# Patient Record
Sex: Female | Born: 1965
Health system: Southern US, Community
[De-identification: ages and names within clinical notes are randomized; demographics above are authoritative.]

## PROBLEM LIST (undated history)

## (undated) DIAGNOSIS — E785 Hyperlipidemia, unspecified: Secondary | ICD-10-CM

## (undated) DIAGNOSIS — K5190 Ulcerative colitis, unspecified, without complications: Secondary | ICD-10-CM

## (undated) DIAGNOSIS — K429 Umbilical hernia without obstruction or gangrene: Secondary | ICD-10-CM

## (undated) DIAGNOSIS — G629 Polyneuropathy, unspecified: Secondary | ICD-10-CM

## (undated) DIAGNOSIS — E559 Vitamin D deficiency, unspecified: Secondary | ICD-10-CM

## (undated) DIAGNOSIS — F329 Major depressive disorder, single episode, unspecified: Secondary | ICD-10-CM

## (undated) DIAGNOSIS — I700 Atherosclerosis of aorta: Secondary | ICD-10-CM

## (undated) DIAGNOSIS — J45909 Unspecified asthma, uncomplicated: Secondary | ICD-10-CM

## (undated) DIAGNOSIS — F32A Depression, unspecified: Secondary | ICD-10-CM

## (undated) DIAGNOSIS — K219 Gastro-esophageal reflux disease without esophagitis: Secondary | ICD-10-CM

## (undated) DIAGNOSIS — M130 Polyarthritis, unspecified: Secondary | ICD-10-CM

## (undated) DIAGNOSIS — E039 Hypothyroidism, unspecified: Secondary | ICD-10-CM

## (undated) DIAGNOSIS — M797 Fibromyalgia: Secondary | ICD-10-CM

## (undated) DIAGNOSIS — G4700 Insomnia, unspecified: Secondary | ICD-10-CM

## (undated) DIAGNOSIS — E079 Disorder of thyroid, unspecified: Secondary | ICD-10-CM

## (undated) HISTORY — DX: Vitamin D deficiency, unspecified: E55.9

## (undated) HISTORY — DX: Gastro-esophageal reflux disease without esophagitis: K21.9

## (undated) HISTORY — DX: Atherosclerosis of aorta: I70.0

## (undated) HISTORY — DX: Insomnia, unspecified: G47.00

## (undated) HISTORY — DX: Ulcerative colitis, unspecified, without complications: K51.90

## (undated) HISTORY — DX: Hyperlipidemia, unspecified: E78.5

## (undated) HISTORY — DX: Hypothyroidism, unspecified: E03.9

## (undated) HISTORY — DX: Disorder of thyroid, unspecified: E07.9

## (undated) HISTORY — PX: COLONOSCOPY: SHX174

## (undated) HISTORY — DX: Polyarthritis, unspecified: M13.0

## (undated) HISTORY — DX: Fibromyalgia: M79.7

## (undated) HISTORY — DX: Unspecified asthma, uncomplicated: J45.909

## (undated) HISTORY — PX: ABDOMINAL HYSTERECTOMY: SHX81

## (undated) HISTORY — DX: Umbilical hernia without obstruction or gangrene: K42.9

## (undated) HISTORY — DX: Polyneuropathy, unspecified: G62.9

## (undated) HISTORY — PX: APPENDECTOMY: SHX54

---

## 1998-04-18 ENCOUNTER — Emergency Department (HOSPITAL_COMMUNITY): Admission: EM | Admit: 1998-04-18 | Discharge: 1998-04-18 | Payer: Self-pay | Admitting: Emergency Medicine

## 1999-06-05 ENCOUNTER — Other Ambulatory Visit: Admission: RE | Admit: 1999-06-05 | Discharge: 1999-06-05 | Payer: Self-pay | Admitting: Family Medicine

## 1999-10-08 ENCOUNTER — Encounter: Admission: RE | Admit: 1999-10-08 | Discharge: 1999-10-08 | Payer: Self-pay | Admitting: Family Medicine

## 1999-10-08 ENCOUNTER — Encounter: Payer: Self-pay | Admitting: Family Medicine

## 2000-04-03 ENCOUNTER — Inpatient Hospital Stay (HOSPITAL_COMMUNITY): Admission: AD | Admit: 2000-04-03 | Discharge: 2000-04-03 | Payer: Self-pay | Admitting: *Deleted

## 2014-05-24 LAB — HM COLONOSCOPY

## 2015-03-10 ENCOUNTER — Emergency Department (HOSPITAL_BASED_OUTPATIENT_CLINIC_OR_DEPARTMENT_OTHER)
Admission: EM | Admit: 2015-03-10 | Discharge: 2015-03-10 | Disposition: A | Discharge status: Home / Self Care | Payer: Commercial Managed Care - PPO | Attending: Emergency Medicine | Admitting: Emergency Medicine

## 2015-03-10 ENCOUNTER — Encounter (HOSPITAL_BASED_OUTPATIENT_CLINIC_OR_DEPARTMENT_OTHER): Payer: Self-pay | Admitting: *Deleted

## 2015-03-10 DIAGNOSIS — S6991XD Unspecified injury of right wrist, hand and finger(s), subsequent encounter: Secondary | ICD-10-CM | POA: Diagnosis present

## 2015-03-10 DIAGNOSIS — Z23 Encounter for immunization: Secondary | ICD-10-CM | POA: Diagnosis not present

## 2015-03-10 DIAGNOSIS — Z8659 Personal history of other mental and behavioral disorders: Secondary | ICD-10-CM | POA: Diagnosis not present

## 2015-03-10 DIAGNOSIS — Z8719 Personal history of other diseases of the digestive system: Secondary | ICD-10-CM | POA: Insufficient documentation

## 2015-03-10 DIAGNOSIS — Z880 Allergy status to penicillin: Secondary | ICD-10-CM | POA: Insufficient documentation

## 2015-03-10 DIAGNOSIS — S61031D Puncture wound without foreign body of right thumb without damage to nail, subsequent encounter: Secondary | ICD-10-CM | POA: Diagnosis not present

## 2015-03-10 DIAGNOSIS — Z87891 Personal history of nicotine dependence: Secondary | ICD-10-CM | POA: Diagnosis not present

## 2015-03-10 DIAGNOSIS — X58XXXD Exposure to other specified factors, subsequent encounter: Secondary | ICD-10-CM | POA: Diagnosis not present

## 2015-03-10 DIAGNOSIS — T148XXA Other injury of unspecified body region, initial encounter: Secondary | ICD-10-CM

## 2015-03-10 HISTORY — DX: Depression, unspecified: F32.A

## 2015-03-10 HISTORY — DX: Major depressive disorder, single episode, unspecified: F32.9

## 2015-03-10 HISTORY — DX: Gastro-esophageal reflux disease without esophagitis: K21.9

## 2015-03-10 MED ORDER — TETANUS-DIPHTH-ACELL PERTUSSIS 5-2.5-18.5 LF-MCG/0.5 IM SUSP
0.5000 mL | Freq: Once | INTRAMUSCULAR | Status: AC
  Administered 2015-03-10: 0.5 mL via INTRAMUSCULAR
  Filled 2015-03-10: qty 0.5

## 2015-03-10 NOTE — ED Provider Notes (Signed)
Medical screening examination/treatment/procedure(s) were conducted as a shared visit with non-physician practitioner(s) and myself.  I personally evaluated the patient during the encounter.   EKG Interpretation None      Patient seen by me. Patient with injury probable of bite to the right thumb on June 9 while gardening whatever causes it was never visualized but some kind of bite him with some pain was noted. There was never any significant swelling of the thumb or hand. Patient treated with doxycycline later by her primary care doctor. Right now there are 2 somewhat necrotic puncture marks no significant tenderness to the pulp no evidence of any deep space infection or cellulitis. Good range of motion. Cap refill is 1 second.  Overall the wound is healing well does have 2 necrotic areas but no evidence of any cyst secondary infection no evidence of any deep space infection. No specific treatment required. Precautions provided.  Vanetta Mulders, MD 03/10/15 [phone]

## 2015-03-10 NOTE — Discharge Instructions (Signed)
°Emergency Department Resource Guide °1) Find a Doctor and Pay Out of Pocket °Although you won't have to find out who is covered by your insurance plan, it is a good idea to ask around and get recommendations. You will then need to call the office and see if the doctor you have chosen will accept you as a new patient and what types of options they offer for patients who are self-pay. Some doctors offer discounts or will set up payment plans for their patients who do not have insurance, but you will need to ask so you aren't surprised when you get to your appointment. ° °2) Contact Your Local Health Department °Not all health departments have doctors that can see patients for sick visits, but many do, so it is worth a call to see if yours does. If you don't know where your local health department is, you can check in your phone book. The CDC also has a tool to help you locate your state's health department, and many state websites also have listings of all of their local health departments. ° °3) Find a Walk-in Clinic °If your illness is not likely to be very severe or complicated, you may want to try a walk in clinic. These are popping up all over the country in pharmacies, drugstores, and shopping centers. They're usually staffed by nurse practitioners or physician assistants that have been trained to treat common illnesses and complaints. They're usually fairly quick and inexpensive. However, if you have serious medical issues or chronic medical problems, these are probably not your best option. ° °No Primary Care Doctor: °- Call [facility] at  832-8000 - they can help you locate a primary care doctor that  accepts your insurance, provides certain services, etc. °- Physician Referral Service- 1-800-533-3463 ° °Chronic Pain Problems: °Organization         Address  Phone   Notes  °[facility]  (336) 297-2271 Patients need to be referred by their primary care doctor.  ° °Medication  Assistance: °Organization         Address  Phone   Notes  °[place] [facility] [address] °[place], [place] [address] (336) 641-8030 --Must be a resident of [place] °-- Must have NO insurance coverage whatsoever (no Medicaid/ Medicare, etc.) °-- The pt. MUST have a primary care doctor that directs their care regularly and follows them in the community °  °[facility]  (866) 331-1348   °[employer]  (888) 892-1162   ° °Agencies that provide inexpensive medical care: °Organization         Address  Phone   Notes  °[facility]  (336) 832-8035   °[facility]    (336) 832-7272   °[facility] [address] °[place], [place] [address] (336) 832-4777   °[facility] [address], °[place] (336) 271-4999   °[facility]    (336) 373-0678   °[facility]    (336) 272-1050   °[facility] ° [address], [place] Phone:  (336) 832-4444, Fax:  (336) 832-4440 Hours of Operation:  9 am - 6 pm, M-F.  Also accepts Medicaid/Medicare and self-pay.  °[facility] ° [address], [place] Phone: (336) 832-3150, Fax: (336) 832-3151. Hours of Operation:  8:30 am - 5:30 pm, M-F.  Also accepts Medicaid and self-pay.  °[facility] [place] [address], [place] Phone: (336) 878-6027   °[facility] [address], [place], [place] (336)723-1848, Ext. 123 Mondays & Thursdays: 7-9 AM.  First 15 patients are seen on a first come, first serve basis. °  ° °Medicaid-accepting [place] Providers: ° °Organization         Address  Phone   Notes  °[facility] [address], [place] (336) 641-2100 Also accepts self-pay patients.  °[facility] [address], [place] ° (336) 856-9996   °[facility] [address], [place]  (336) 288-8857   °[facility] [address] [place] [address], [place] (336) 299-7000   °Veita Bland [address], [place]  ° (336) 373-1557 Only accepts [place] Access Medicaid patients after they have their name applied to their card.  ° °Self-Pay (no insurance) in [place]: ° °Organization         Address  Phone   Notes  °Sickle Cell Patients, [facility] [address], [place] (336) 832-1970   °[facility] [address], [place] (336) 832-4400   °[facility] ° [address], [place] (336) 992-4800   °[facility]/Dr. Osei-Bonsu ° [address] [place] [address], [place] or [address], [place] (336) 841-8500 Phone number for both [place] and [place] locations is the same.  °[facility] [address], [place] (336) 299-0000   °[facility] [address] [place] [address], [place] or [address] (336) 852-7530 °(336) 878-2260   °[facility] [address], [place] (336) 350-1642, phone; (336) 294-5005, fax Sees patients 1st and 3rd Saturday of every month.  Must not qualify for public or private insurance (i.e. Medicaid, Medicare, [place] Health Choice, Veterans' Benefits) • Household income should be no more than 200% of the poverty level •The clinic cannot treat you if you are pregnant or think you are pregnant • Sexually transmitted diseases are not treated at the clinic.  ° ° °Dental Care: °Organization         Address  Phone  Notes  °[place] [facility] [address], [place] (336) 641-6152 Accepts children up to age 21 who are enrolled in Medicaid or [place] Health Choice; pregnant women with a Medicaid card; and children who have applied for Medicaid or [place] Health Choice, but were declined, whose parents can pay a reduced fee at time of service.  °[place]  [facility] [place]  [address], [place] (336) 641-7733 Accepts children up to age 21 who are enrolled in Medicaid or  Health Choice; pregnant women with a Medicaid card; and children who have applied for Medicaid or  Health Choice, but were declined, whose parents can pay a reduced fee at time of service.  °[facility] ° [address],  (336) 641-4533 Patients are seen by appointment only. Walk-ins are not accepted. [facility] will see patients 18 years of age and older. °Monday - Tuesday (8am-5pm) °Most Wednesdays (8:30-5pm) °$30 per visit, cash only  °[facility] ° [address], [place] (336) 641-4533 Patients are seen by appointment only. Walk-ins are not accepted. [facility] will see patients 18 years of age and older. °One   Wednesday Evening (Monthly: Volunteer Based).  $30 per visit, cash only  °[facility]  (919) 537-3737 for adults; Children under age 4, call Graduate Pediatric Dentistry at (919) [phone]. Children aged 4-14, please call (919) [phone] to request a pediatric application. ° Dental services are provided in all areas of dental care including fillings, crowns and bridges, complete and partial dentures, implants, gum treatment, root canals, and extractions. Preventive care is also provided. Treatment is provided to both adults and children. °Patients are selected via a lottery and there is often a waiting list. °  °[facility] [address], °[place] ° (336) 763-8833 [email or web address] °  °[facility] [address], [place], [place] (336)723-1848, Ext. 123 Second and Fourth Thursday of each month, opens at 6:30 AM; Clinic ends at 9 AM.  Patients are seen on a first-come first-served basis, and a limited number are seen during each clinic.  ° °[facility] ° [address], [place], [place] (336) 723-7904    Eligibility Requirements °You must have lived in [place], or [place] counties for at least the last three months. °  You cannot be eligible for state or federal sponsored healthcare insurance, including Veterans Administration, Medicaid, or Medicare. °  You generally cannot be eligible for healthcare insurance through your employer.  °  How to apply: °Eligibility screenings are held every Tuesday and Wednesday afternoon from 1:00 pm until 4:00 pm. You do not need an appointment for the interview!  °[facility] [address], [place], [place] 336-631-2330   °[facility]  336-342-8273   °[facility]  336-703-3100   °[facility]  336-570-6415   ° °Behavioral Health Resources in the Community: °Intensive Outpatient Programs °Organization         Address  Phone  Notes  °[facility] [address], [place], [place] 336-878-6098   °[facility] [address], [place], [place] 336-832-9800   °[facility] [address], [place], [place] ° 336-882-2125   °[facility] [address],  °[place], [place] 1-800-853-5163 or 336-641-4981   °Substance Abuse Resources °Organization         Address  Phone  Notes  °[facility]  336-882-2125   °[facility]  336-784-9470   °[facility]  336-285-9073   °[facility]  336-845-3988   °Residential & Outpatient Substance Abuse Program  1-800-659-3381   °Psychological Services °Organization         Address  Phone  Notes  °[facility]  336- 832-9600   °[facility]  336- 378-7881   °[facility] [address], [place] 1-800-853-5163 or 336-641-4981   ° °Mobile Crisis Teams °Organization         Address  Phone  Notes  °[facility], Mobile Crisis Care Unit  1-877-626-1772   °Assertive °[facility] ° [address]  [place], [place] 336-834-9664   °Sharon DeEsch [address] °[place] 336-554-5454   ° °Self-Help/Support Groups °Organization         Address  Phone             Notes  °[facility] - variety of support groups  336- 373-1402 Call for more information  °Narcotics Anonymous (NA), [facility] [address], °[place]  2 meetings at this location  ° °  Residential Treatment Programs °Organization         Address  Phone  Notes  °[facility] [address],    °[place]  1-866-801-8205   °[facility] ° [address], [place], [place] 704-293-8524   °[facility] [address], [place] 336-845-3988 Admissions: 8am-3pm M-F  °[facility] [address]    °[place], [place] 336-841-1104   °[facility] [address], [place], [place] 336-379-7146   °[facility] [address]  °[place], [place] 336-285-9073   °[facility] Intensive Outpatient [address], [place], [place] 336-852-3033   °[facility] [address]  °[place], [place] 1-877-615-2722 or 336-784-9470   °[facility] [address] [place], [place] 336-227-7417 Accepts Medicaid  °[facility] [address]  ° [place] 1-800-659-3381 Substance Abuse/Addiction Treatment  ° °[place] Behavioral Health Resources °Organization         Address  Phone  Notes  °[facility]  (888) 581-9988   °Julie Brannon, PhD [address] [place], [place]   (336) 349-5553 or (336) 951-0000   °[facility]   [address] °[place], [place] (336) 349-4454   °[facility] [address], [place], [place] (336) 342-8316 Insurance/Medicaid/sponsorship through [facility]  °[facility] [address]                                    [place], [place] (336) 342-8316 Therapy/tele-psych/case    °[facility] [address]  ° [place], [place] (336) 349-2233    °Dr. Arfeen  (336) 349-4544   °[facility] [place]  [employer] [place] [facility] 1) [address], [place] °2) [address], [place] °3)  [address], [place] (336) 349-3220 °(336) 342-7768 ° °(336) 342-8140   °[place] [facility] (336) 342-1394 or (336) 342-3537 (After Hours)    ° ° °

## 2015-03-10 NOTE — ED Provider Notes (Signed)
CSN: [id number]     Arrival date & time 03/10/15  2048 History   First MD Initiated Contact with Patient 03/10/15 2158     Chief Complaint  Patient presents with  . Finger Injury     (Consider location/radiation/quality/duration/timing/severity/associated sxs/prior Treatment) HPI Comments: Patient presents today with concern that she may have an infection of her right thumb.  She states that she was outside gardening on 02/22/15 when she felt something bite her thumb.  She suspected that it was a snake, but never actually saw a snake or insect.  She states that the bite was associated with some pain, but never had any significant swelling.  She was seen by a PCP on 02/26/15 and was prescribed Doxycycline at that time, which she finished the 7 day course.  She denies any drainage from the wound.  She does report that the wound is black in color.  She reports mild swelling of the thumb, no erythema.  She denies any numbness or tingling of the thumb.  She denies any history of DM.  The history is provided by the patient.    Past Medical History  Diagnosis Date  . Acid reflux   . Depression    Past Surgical History  Procedure Laterality Date  . Abdominal hysterectomy    . Appendectomy     No family history on file. History  Substance Use Topics  . Smoking status: Former Smoker    Quit date: 03/10/2011  . Smokeless tobacco: Not on file  . Alcohol Use: No   OB History    No data available     Review of Systems  Constitutional: Negative for fever and chills.  Gastrointestinal: Negative for nausea and vomiting.  Skin: Positive for wound.  Neurological: Negative for numbness.  All other systems reviewed and are negative.     Allergies  Penicillins and Sulfa antibiotics  Home Medications   Prior to Admission medications   Not on File   BP 118/74 mmHg  Pulse 72  Temp(Src) 98.3 F (36.8 C) (Oral)  Resp 20  Ht 5\' 3"  (1.6 m)  Wt 215 lb 3 oz (97.608 kg)  BMI 38.13 kg/m2   SpO2 100% Physical Exam  Constitutional: She appears well-developed and well-nourished.  HENT:  Head: Normocephalic and atraumatic.  Neck: Normal range of motion. Neck supple.  Cardiovascular: Normal rate, regular rhythm and normal heart sounds.   Pulmonary/Chest: Effort normal and breath sounds normal.  Musculoskeletal: Normal range of motion.  Full ROM of the left thumb  Neurological: She is alert.  Sensation of the left thumb is intact  Skin: Skin is warm and dry.  Two small puncture wounds to the finger pad of the left thumb with small areas of necrosis.  No drainage.  No surrounding erythema, edema, or warmth.  No significant pain with palpation of the finger pad.  Good capillary refill of the left thumb/ compartments soft.    Psychiatric: She has a normal mood and affect.  Nursing note and vitals reviewed.   ED Course  Procedures (including critical care time) Labs Review Labs Reviewed - No data to display  Imaging Review No results found.   EKG Interpretation None      MDM   Final diagnoses:  None   Patient presents today with a concern that she may have an infection of her left thumb.  She thinks that she was bitten by something on 02/22/15.  She completed a 7 day course of Doxycycline that was  prescribed by her PCP.  No signs of infection at this time.  She does have two small areas of necrosis, but thumb appears to be healing well.  Neurovascularly intact.  Patient is stable for discharge.  Return precautions given.      Santiago Glad, PA-C 03/12/15 1035

## 2015-03-10 NOTE — ED Notes (Signed)
Injury to R thumb on 6/16. Occurred while working in the garden. Not sure if it was a snake, spider or thorn. Seen for same previously by "company doctor" and finished amox. 2 black marks noted to R thumb pad. Bruising, swelling, scant erythema and black tissue noted. Skin/wound dry. Describes pain as only TTP.

## 2015-04-02 ENCOUNTER — Encounter (HOSPITAL_BASED_OUTPATIENT_CLINIC_OR_DEPARTMENT_OTHER): Payer: Self-pay | Admitting: *Deleted

## 2015-04-02 ENCOUNTER — Emergency Department (HOSPITAL_BASED_OUTPATIENT_CLINIC_OR_DEPARTMENT_OTHER)
Admission: EM | Admit: 2015-04-02 | Discharge: 2015-04-02 | Disposition: A | Discharge status: Home / Self Care | Payer: Commercial Managed Care - PPO | Attending: Emergency Medicine | Admitting: Emergency Medicine

## 2015-04-02 ENCOUNTER — Emergency Department (HOSPITAL_BASED_OUTPATIENT_CLINIC_OR_DEPARTMENT_OTHER): Payer: Commercial Managed Care - PPO

## 2015-04-02 DIAGNOSIS — M549 Dorsalgia, unspecified: Secondary | ICD-10-CM | POA: Diagnosis present

## 2015-04-02 DIAGNOSIS — R109 Unspecified abdominal pain: Secondary | ICD-10-CM

## 2015-04-02 DIAGNOSIS — M5430 Sciatica, unspecified side: Secondary | ICD-10-CM | POA: Diagnosis not present

## 2015-04-02 DIAGNOSIS — Z880 Allergy status to penicillin: Secondary | ICD-10-CM | POA: Diagnosis not present

## 2015-04-02 DIAGNOSIS — R200 Anesthesia of skin: Secondary | ICD-10-CM | POA: Diagnosis not present

## 2015-04-02 DIAGNOSIS — Z8719 Personal history of other diseases of the digestive system: Secondary | ICD-10-CM | POA: Insufficient documentation

## 2015-04-02 DIAGNOSIS — Z8659 Personal history of other mental and behavioral disorders: Secondary | ICD-10-CM | POA: Diagnosis not present

## 2015-04-02 DIAGNOSIS — Z87891 Personal history of nicotine dependence: Secondary | ICD-10-CM | POA: Insufficient documentation

## 2015-04-02 DIAGNOSIS — R1012 Left upper quadrant pain: Secondary | ICD-10-CM | POA: Diagnosis not present

## 2015-04-02 LAB — URINALYSIS, ROUTINE W REFLEX MICROSCOPIC
Bilirubin Urine: NEGATIVE
Glucose, UA: NEGATIVE mg/dL
Hgb urine dipstick: NEGATIVE
Ketones, ur: NEGATIVE mg/dL
Leukocytes, UA: NEGATIVE
Nitrite: NEGATIVE
Protein, ur: NEGATIVE mg/dL
Specific Gravity, Urine: 1.004 — ABNORMAL LOW (ref 1.005–1.030)
Urobilinogen, UA: 0.2 mg/dL (ref 0.0–1.0)
pH: 5.5 (ref 5.0–8.0)

## 2015-04-02 MED ORDER — CYCLOBENZAPRINE HCL 10 MG PO TABS: 10.0000 mg | ORAL_TABLET | Freq: Three times a day (TID) | ORAL | Status: DC | PRN

## 2015-04-02 MED ORDER — CYCLOBENZAPRINE HCL 10 MG PO TABS
10.0000 mg | ORAL_TABLET | Freq: Once | ORAL | Status: AC
  Administered 2015-04-02: 10 mg via ORAL
  Filled 2015-04-02: qty 1

## 2015-04-02 MED ORDER — HYDROCODONE-ACETAMINOPHEN 5-325 MG PO TABS: 1.0000 | ORAL_TABLET | Freq: Four times a day (QID) | ORAL | Status: DC | PRN

## 2015-04-02 NOTE — ED Provider Notes (Signed)
CSN: [id number]   Arrival date & time 04/02/15 1857  History  This chart was scribed for  Kelly MochaBlair Chaske Paskett, MD by Bethel BornBritney McCollum, ED Scribe. This patient was seen in room MH04/MH04 and the patient's care was started at 7:11 PM.  Chief Complaint  Patient presents with  . Back Pain    HPI Patient is a 49 y.o. female presenting with back pain. The history is provided by the patient and the spouse. No language interpreter was used.  Back Pain Location:  Thoracic spine Radiates to:  L shoulder, R shoulder, L foot and R foot Pain severity:  Moderate Onset quality:  Sudden Duration:  2 weeks Timing:  Constant Progression:  Worsening Chronicity:  New Context: not falling, not jumping from heights, not lifting heavy objects, not MCA, not MVA, not occupational injury, not pedestrian accident, not recent injury and not twisting   Relieved by:  Nothing Worsened by:  Standing and sitting Ineffective treatments:  NSAIDs Associated symptoms: numbness   Associated symptoms: no bladder incontinence, no bowel incontinence, no chest pain, no fever, no paresthesias, no tingling and no weakness   Risk factors: obesity    Kelly RenshawSusan A Hansen is a 49 y.o. female who presents to the Emergency Department complaining of atraumatic left-sided back pain with onset on 03/19/15 at work. The pain starts in the mid back and radiates to the feet. The pain also radiates to the shoulder and occasionally the abdomen. She rates the pain 6/10 in severity. Sitting or standing for long periods of time exacerbates the pain. Aleve provided insufficient pain relief PTA. Associated symptoms include intermittent swelling and numbness in the legs and feet. Pt denies dysuria, fever, vomiting, bowel or bladder incontinence, chest pain, and new SOB.  Past Medical History  Diagnosis Date  . Acid reflux   . Depression     Past Surgical History  Procedure Laterality Date  . Abdominal hysterectomy    . Appendectomy      No family history  on file.  History  Substance Use Topics  . Smoking status: Former Smoker    Quit date: 03/10/2011  . Smokeless tobacco: Not on file  . Alcohol Use: No     Review of Systems  Constitutional: Negative for fever.  Respiratory: Negative for shortness of breath.   Cardiovascular: Negative for chest pain.  Gastrointestinal: Negative for bowel incontinence.  Genitourinary: Negative for bladder incontinence.  Musculoskeletal: Positive for back pain.  Neurological: Positive for numbness. Negative for tingling, weakness and paresthesias.  All other systems reviewed and are negative.   Home Medications   Prior to Admission medications   Not on File    Allergies  Penicillins and Sulfa antibiotics  Triage Vitals: BP 107/65 mmHg  Pulse 92  Temp(Src) 98.5 F (36.9 C) (Oral)  Resp 20  Ht 5\' 3"  (1.6 m)  Wt 215 lb (97.523 kg)  BMI 38.09 kg/m2  SpO2 98%  Physical Exam  Constitutional: She is oriented to person, place, and time. She appears well-developed and well-nourished. No distress.  HENT:  Head: Normocephalic and atraumatic.  Mouth/Throat: Oropharynx is clear and moist.  Eyes: EOM are normal. Pupils are equal, round, and reactive to light.  Neck: Normal range of motion. Neck supple.  Cardiovascular: Normal rate and regular rhythm.  Exam reveals no friction rub.   No murmur heard. Pulmonary/Chest: Effort normal and breath sounds normal. No respiratory distress. She has no wheezes. She has no rales.  Abdominal: Soft. She exhibits no distension. There is tenderness (  mild LUQ, mid L flank). There is no rebound.  Musculoskeletal: Normal range of motion. She exhibits no edema.  Neurological: She is alert and oriented to person, place, and time.  Reflex Scores:      Patellar reflexes are 2+ on the right side and 2+ on the left side. Skin: No rash noted. She is not diaphoretic.  Nursing note and vitals reviewed.   ED Course  Procedures   DIAGNOSTIC STUDIES: Oxygen Saturation  is 98% on RA, normal by my interpretation.    COORDINATION OF CARE: 7:23 PM Discussed treatment plan which includes UA, CT renal stone, and Flexeril with pt at bedside and pt agreed to plan.  Labs Review-  Labs Reviewed  URINALYSIS, ROUTINE W REFLEX MICROSCOPIC (NOT AT [facility])    Imaging Review No results found.  EKG Interpretation None      MDM   Final diagnoses:  Left flank pain  Sciatica, unspecified laterality   49 year old female here with left back pain radiates down both sides of her back and down into the backs of both legs. Present for 2 weeks. Began its wall sitting. Denies any bowel or prior incontinence. No fevers, vomiting, diarrhea, abdominal pain. No concern for acute cord compression. On exam she has no lower extremity weakness or sensory deficits. Normal reflexes. No spasms in her back noted. She states the pain wraps around the thigh and goes to the mid left flank. This is likely sciatic in nature, but we'll scan for possible kidney stone. Scan is normal. Patient given pain meds and muscle relaxers. Stable for discharge.  I personally performed the services described in this documentation, which was scribed in my presence. The recorded information has been reviewed and is accurate.      Kelly Mocha, MD 04/02/15 2102

## 2015-04-02 NOTE — ED Notes (Signed)
Left flank pain with radiation into her left leg. Swelling in her leg.

## 2015-04-02 NOTE — Discharge Instructions (Signed)
Sciatica Sciatica is pain, weakness, numbness, or tingling along the path of the sciatic nerve. The nerve starts in the lower back and runs down the back of each leg. The nerve controls the muscles in the lower leg and in the back of the knee, while also providing sensation to the back of the thigh, lower leg, and the sole of your foot. Sciatica is a symptom of another medical condition. For instance, nerve damage or certain conditions, such as a herniated disk or bone spur on the spine, pinch or put pressure on the sciatic nerve. This causes the pain, weakness, or other sensations normally associated with sciatica. Generally, sciatica only affects one side of the body. CAUSES   Herniated or slipped disc.  Degenerative disk disease.  A pain disorder involving the narrow muscle in the buttocks (piriformis syndrome).  Pelvic injury or fracture.  Pregnancy.  Tumor (rare). SYMPTOMS  Symptoms can vary from mild to very severe. The symptoms usually travel from the low back to the buttocks and down the back of the leg. Symptoms can include:  Mild tingling or dull aches in the lower back, leg, or hip.  Numbness in the back of the calf or sole of the foot.  Burning sensations in the lower back, leg, or hip.  Sharp pains in the lower back, leg, or hip.  Leg weakness.  Severe back pain inhibiting movement. These symptoms may get worse with coughing, sneezing, laughing, or prolonged sitting or standing. Also, being overweight may worsen symptoms. DIAGNOSIS  Your caregiver will perform a physical exam to look for common symptoms of sciatica. He or she may ask you to do certain movements or activities that would trigger sciatic nerve pain. Other tests may be performed to find the cause of the sciatica. These may include:  Blood tests.  X-rays.  Imaging tests, such as an MRI or CT scan. TREATMENT  Treatment is directed at the cause of the sciatic pain. Sometimes, treatment is not necessary  and the pain and discomfort goes away on its own. If treatment is needed, your caregiver may suggest:  Over-the-counter medicines to relieve pain.  Prescription medicines, such as anti-inflammatory medicine, muscle relaxants, or narcotics.  Applying heat or ice to the painful area.  Steroid injections to lessen pain, irritation, and inflammation around the nerve.  Reducing activity during periods of pain.  Exercising and stretching to strengthen your abdomen and improve flexibility of your spine. Your caregiver may suggest losing weight if the extra weight makes the back pain worse.  Physical therapy.  Surgery to eliminate what is pressing or pinching the nerve, such as a bone spur or part of a herniated disk. HOME CARE INSTRUCTIONS   Only take over-the-counter or prescription medicines for pain or discomfort as directed by your caregiver.  Apply ice to the affected area for 20 minutes, 3-4 times a day for the first 48-72 hours. Then try heat in the same way.  Exercise, stretch, or perform your usual activities if these do not aggravate your pain.  Attend physical therapy sessions as directed by your caregiver.  Keep all follow-up appointments as directed by your caregiver.  Do not wear high heels or shoes that do not provide proper support.  Check your mattress to see if it is too soft. A firm mattress may lessen your pain and discomfort. SEEK IMMEDIATE MEDICAL CARE IF:   You lose control of your bowel or bladder (incontinence).  You have increasing weakness in the lower back, pelvis, buttocks,   or legs.  You have redness or swelling of your back.  You have a burning sensation when you urinate.  You have pain that gets worse when you lie down or awakens you at night.  Your pain is worse than you have experienced in the past.  Your pain is lasting longer than 4 weeks.  You are suddenly losing weight without reason. MAKE SURE YOU:  Understand these  instructions.  Will watch your condition.  Will get help right away if you are not doing well or get worse. Document Released: 08/26/2001 Document Revised: 03/02/2012 Document Reviewed: 01/11/2012 ExitCare Patient Information 2015 [employer]. This information is not intended to replace advice given to you by your health care provider. Make sure you discuss any questions you have with your health care provider.  

## 2015-04-02 NOTE — ED Notes (Addendum)
Mid back pain bilateral radiating to bilateral legs,  w leg swelling at times,  Pain radiates to shoulder blades and abd at times,  Denies urinaty sx,  No n/v, no fever  Onset July 4,  Denies inj

## 2015-12-02 ENCOUNTER — Other Ambulatory Visit: Payer: Self-pay

## 2015-12-02 ENCOUNTER — Emergency Department (HOSPITAL_BASED_OUTPATIENT_CLINIC_OR_DEPARTMENT_OTHER)
Admission: EM | Admit: 2015-12-02 | Discharge: 2015-12-03 | Disposition: A | Discharge status: Home / Self Care | Payer: Commercial Managed Care - PPO | Attending: Emergency Medicine | Admitting: Emergency Medicine

## 2015-12-02 ENCOUNTER — Emergency Department (HOSPITAL_BASED_OUTPATIENT_CLINIC_OR_DEPARTMENT_OTHER): Payer: Commercial Managed Care - PPO

## 2015-12-02 ENCOUNTER — Encounter (HOSPITAL_BASED_OUTPATIENT_CLINIC_OR_DEPARTMENT_OTHER): Payer: Self-pay | Admitting: *Deleted

## 2015-12-02 DIAGNOSIS — Z8719 Personal history of other diseases of the digestive system: Secondary | ICD-10-CM | POA: Insufficient documentation

## 2015-12-02 DIAGNOSIS — R42 Dizziness and giddiness: Secondary | ICD-10-CM | POA: Diagnosis present

## 2015-12-02 DIAGNOSIS — H8110 Benign paroxysmal vertigo, unspecified ear: Secondary | ICD-10-CM | POA: Diagnosis not present

## 2015-12-02 DIAGNOSIS — Z8659 Personal history of other mental and behavioral disorders: Secondary | ICD-10-CM | POA: Diagnosis not present

## 2015-12-02 DIAGNOSIS — E669 Obesity, unspecified: Secondary | ICD-10-CM | POA: Insufficient documentation

## 2015-12-02 DIAGNOSIS — Z87891 Personal history of nicotine dependence: Secondary | ICD-10-CM | POA: Diagnosis not present

## 2015-12-02 DIAGNOSIS — Z880 Allergy status to penicillin: Secondary | ICD-10-CM | POA: Diagnosis not present

## 2015-12-02 LAB — COMPREHENSIVE METABOLIC PANEL
ALBUMIN: 4.2 g/dL (ref 3.5–5.0)
ALK PHOS: 59 U/L (ref 38–126)
ALT: 26 U/L (ref 14–54)
AST: 23 U/L (ref 15–41)
Anion gap: 8 (ref 5–15)
BILIRUBIN TOTAL: 0.5 mg/dL (ref 0.3–1.2)
BUN: 14 mg/dL (ref 6–20)
CALCIUM: 9 mg/dL (ref 8.9–10.3)
CO2: 25 mmol/L (ref 22–32)
CREATININE: 1.01 mg/dL — AB (ref 0.44–1.00)
Chloride: 106 mmol/L (ref 101–111)
GFR calc Af Amer: >60 mL/min (ref >60)
GFR calc non Af Amer: >60 mL/min (ref >60)
GLUCOSE: 106 mg/dL — AB (ref 65–99)
Potassium: 4.1 mmol/L (ref 3.5–5.1)
SODIUM: 139 mmol/L (ref 135–145)
TOTAL PROTEIN: 6.6 g/dL (ref 6.5–8.1)

## 2015-12-02 LAB — URINALYSIS, ROUTINE W REFLEX MICROSCOPIC
Bilirubin Urine: NEGATIVE
Glucose, UA: NEGATIVE mg/dL
Hgb urine dipstick: NEGATIVE
Ketones, ur: NEGATIVE mg/dL
Leukocytes, UA: NEGATIVE
Nitrite: NEGATIVE
Protein, ur: NEGATIVE mg/dL
Specific Gravity, Urine: 1.021 (ref 1.005–1.030)
pH: 5.5 (ref 5.0–8.0)

## 2015-12-02 LAB — CBC WITH DIFFERENTIAL/PLATELET
BASOS PCT: 0 %
Basophils Absolute: 0 10*3/uL (ref 0.0–0.1)
EOS ABS: 0.4 10*3/uL (ref 0.0–0.7)
Eosinophils Relative: 5 %
HEMATOCRIT: 41.9 % (ref 36.0–46.0)
HEMOGLOBIN: 14 g/dL (ref 12.0–15.0)
Lymphocytes Relative: 43 %
Lymphs Abs: 3.7 10*3/uL (ref 0.7–4.0)
MCH: 30.5 pg (ref 26.0–34.0)
MCHC: 33.4 g/dL (ref 30.0–36.0)
MCV: 91.3 fL (ref 78.0–100.0)
MONOS PCT: 10 %
Monocytes Absolute: 0.9 10*3/uL (ref 0.1–1.0)
NEUTROS ABS: 3.7 10*3/uL (ref 1.7–7.7)
NEUTROS PCT: 42 %
Platelets: 277 10*3/uL (ref 150–400)
RBC: 4.59 MIL/uL (ref 3.87–5.11)
RDW: 13 % (ref 11.5–15.5)
WBC: 8.7 10*3/uL (ref 4.0–10.5)

## 2015-12-02 LAB — CBG MONITORING, ED: Glucose-Capillary: 116 mg/dL — ABNORMAL HIGH (ref 65–99)

## 2015-12-02 MED ORDER — MECLIZINE HCL 25 MG PO TABS
25.0000 mg | ORAL_TABLET | Freq: Once | ORAL | Status: AC
  Administered 2015-12-02: 25 mg via ORAL
  Filled 2015-12-02: qty 1

## 2015-12-02 NOTE — ED Provider Notes (Signed)
CSN: [id number]     Arrival date & time 12/02/15  1947 History   First MD Initiated Contact with Patient 12/02/15 2218     Chief Complaint  Patient presents with  . Dizziness     (Consider location/radiation/quality/duration/timing/severity/associated sxs/prior Treatment) HPI   50 year old female with history of depression who presents for evaluation of dizziness. Patient report recurrent bouts of dizziness for the past 3 weeks. The first bout 3 weeks ago lasting for a few minutes and resolved. The second bouts was 2 weeks afterward in which it also last for several minutes and she described as a room spinning sensation that started in the morning when she was getting out of her bed. States she fell to the ground and had a syncopal episode. [facility]. Patient was told that she was dehydrated and subsequently sent home. Patient states she has been drinking fluids and does not think that she was dehydrated. For the past 5 days she has had recurrent bouts of dizziness with room spinning sensation worsening with positional change and usually lasting for several minutes. She report intermittent right-sided headache with flash of light on her right eye lasting for less than a minute. No headache at this time. She denies having any fever, neck stiffness, ringing in ears, hearing changes, URI symptoms, chest pain, shortness of breath, nausea vomiting diarrhea, abdominal pain, back pain, focal numbness or weakness, or rash. No recent medication changes. Patient is a former smoker. She denies alcohol abuse.  Past Medical History  Diagnosis Date  . Acid reflux   . Depression    Past Surgical History  Procedure Laterality Date  . Abdominal hysterectomy    . Appendectomy     No family history on file. Social History  Substance Use Topics  . Smoking status: Former Smoker    Quit date: 03/10/2011  . Smokeless tobacco: Never Used  . Alcohol Use: No    OB History    No data available     Review of Systems  All other systems reviewed and are negative.     Allergies  Penicillins and Sulfa antibiotics  Home Medications   Prior to Admission medications   Medication Sig Start Date End Date Taking? Authorizing Provider  cyclobenzaprine (FLEXERIL) 10 MG tablet Take 1 tablet (10 mg total) by mouth 3 (three) times daily as needed for muscle spasms. 04/02/15   Elwin MochaBlair Walden, MD  HYDROcodone-acetaminophen (NORCO/VICODIN) 5-325 MG per tablet Take 1 tablet by mouth every 6 (six) hours as needed for moderate pain. 04/02/15   Elwin MochaBlair Walden, MD   BP 119/70 mmHg  Pulse 64  Temp(Src) 98 F (36.7 C) (Oral)  Resp 19  Ht 5\' 3"  (1.6 m)  Wt 104.327 kg  BMI 40.75 kg/m2  SpO2 98% Physical Exam  Constitutional: She is oriented to person, place, and time. She appears well-developed and well-nourished. No distress.  Obese Caucasian female nontoxic in appearance  HENT:  Head: Atraumatic.  Right Ear: External ear normal.  Left Ear: External ear normal.  Mouth/Throat: Oropharynx is clear and moist.  Eyes: Conjunctivae and EOM are normal. Pupils are equal, round, and reactive to light.  No nystagmus  Neck: Normal range of motion. Neck supple.  Cardiovascular: Normal rate and regular rhythm.  Exam reveals no gallop and no friction rub.   No murmur heard. Pulmonary/Chest: Effort normal and breath sounds normal.  Abdominal: Soft. There is no tenderness.  Neurological: She is alert and  oriented to person, place, and time. She has normal strength. No cranial nerve deficit or sensory deficit. She displays a negative Romberg sign. Coordination and gait normal. GCS eye subscore is 4. GCS verbal subscore is 5. GCS motor subscore is 6.  Skin: No rash noted.  Psychiatric: She has a normal mood and affect.  Nursing note and vitals reviewed.   ED Course  Procedures (including critical care time) Labs Review Labs Reviewed  COMPREHENSIVE METABOLIC PANEL -  Abnormal; Notable for the following:    Glucose, Bld 106 (*)    Creatinine, Ser 1.01 (*)    All other components within normal limits  CBG MONITORING, ED - Abnormal; Notable for the following:    Glucose-Capillary 116 (*)    All other components within normal limits  URINALYSIS, ROUTINE W REFLEX MICROSCOPIC (NOT AT [facility])  CBC WITH DIFFERENTIAL/PLATELET    Imaging Review Ct Head Wo Contrast  12/03/2015  CLINICAL DATA:  Dizziness since Wednesday. Headache and vision changes. EXAM: CT HEAD WITHOUT CONTRAST TECHNIQUE: Contiguous axial images were obtained from the base of the skull through the vertex without intravenous contrast. COMPARISON:  01/21/2012 FINDINGS: Ventricles and sulci appear symmetrical. No ventricular dilatation. No mass effect or midline shift. No abnormal extra-axial fluid collections. Gray-white matter junctions are distinct. Basal cisterns are not effaced. No evidence of acute intracranial hemorrhage. No depressed skull fractures. Sclerosis in the left mastoid air cells. Mild mucosal thickening in the paranasal sinuses. No acute air-fluid levels. Right mastoid air cells are clear. IMPRESSION: No acute intracranial abnormalities. Electronically Signed   By: Burman Nieves M.D.   On: 12/03/2015 00:14   I have personally reviewed and evaluated these images and lab results as part of my medical decision-making.   EKG Interpretation None      Date: 12/03/2015  Rate: 73  Rhythm: normal sinus rhythm  QRS Axis: normal  Intervals: normal  ST/T Wave abnormalities: normal  Conduction Disutrbances: none  Narrative Interpretation:   Old EKG Reviewed: No significant changes noted     MDM   Final diagnoses:  BPPV (benign paroxysmal positional vertigo), unspecified laterality    BP 95/60 mmHg  Pulse 65  Temp(Src) 98 F (36.7 C) (Oral)  Resp 19  Ht  (1.6 m)  Wt 104.327 kg  BMI 40.75 kg/m2  SpO2 98%   10:57 PM Patient report recurrent bouts of vertiginous  symptoms brought on by positional changes. The symptom is likely peripheral vertigo. She has no focal neuro deficit on exam. Her ear exam is unremarkable. She has a normal HINTs exam. She has normal gait. Since patient report occasional headache with these bouts of dizziness, head CT is obtained to rule out malignancy. Workup initiated. Low suspicion for cardiac pathology.  12:19 AM ECG unremarkable.  Head CT without acute intracranial changes.  Labs are reassuring.  Vital sign stable.  No orthostasis.  Pt received meclizine.  She's able to ambulate. Suspect BPPV causing her sxs.  i encourage pt to f/u with PCP for further care. Discussed Epley maneuver. I have low suspicion for central vertigo such as stroke.  Return precaution discussed.  Fayrene Helper, PA-C 12/03/15 1610  Marily Memos, MD 12/05/15 [phone]

## 2015-12-02 NOTE — ED Notes (Signed)
Reports room spinning and dizziness since Wednesday- states Sx come and go- Hx of similar sx in the past

## 2015-12-03 MED ORDER — MECLIZINE HCL 25 MG PO TABS: 25.0000 mg | ORAL_TABLET | Freq: Three times a day (TID) | ORAL | Status: DC | PRN

## 2015-12-03 NOTE — Discharge Instructions (Signed)
Benign Positional Vertigo Vertigo is the feeling that you or your surroundings are moving when they are not. Benign positional vertigo is the most common form of vertigo. The cause of this condition is not serious (is benign). This condition is triggered by certain movements and positions (is positional). This condition can be dangerous if it occurs while you are doing something that could endanger you or others, such as driving.  CAUSES In many cases, the cause of this condition is not known. It may be caused by a disturbance in an area of the inner ear that helps your brain to sense movement and balance. This disturbance can be caused by a viral infection (labyrinthitis), head injury, or repetitive motion. RISK FACTORS This condition is more likely to develop in: 1. Women. 2. People who are 50 years of age or older. SYMPTOMS Symptoms of this condition usually happen when you move your head or your eyes in different directions. Symptoms may start suddenly, and they usually last for less than a minute. Symptoms may include:  Loss of balance and falling.  Feeling like you are spinning or moving.  Feeling like your surroundings are spinning or moving.  Nausea and vomiting.  Blurred vision.  Dizziness.  Involuntary eye movement (nystagmus). Symptoms can be mild and cause only slight annoyance, or they can be severe and interfere with daily life. Episodes of benign positional vertigo may return (recur) over time, and they may be triggered by certain movements. Symptoms may improve over time. DIAGNOSIS This condition is usually diagnosed by medical history and a physical exam of the head, neck, and ears. You may be referred to a health care provider who specializes in ear, nose, and throat (ENT) problems (otolaryngologist) or a provider who specializes in disorders of the nervous system (neurologist). You may have additional testing, including:  MRI.  A CT scan.  Eye movement tests. Your  health care provider may ask you to change positions quickly while he or she watches you for symptoms of benign positional vertigo, such as nystagmus. Eye movement may be tested with an electronystagmogram (ENG), caloric stimulation, the Dix-Hallpike test, or the roll test.  An electroencephalogram (EEG). This records electrical activity in your brain.  Hearing tests. TREATMENT Usually, your health care provider will treat this by moving your head in specific positions to adjust your inner ear back to normal. Surgery may be needed in severe cases, but this is rare. In some cases, benign positional vertigo may resolve on its own in 2-4 weeks. HOME CARE INSTRUCTIONS Safety  Move slowly.Avoid sudden body or head movements.  Avoid driving.  Avoid operating heavy machinery.  Avoid doing any tasks that would be dangerous to you or others if a vertigo episode would occur.  If you have trouble walking or keeping your balance, try using a cane for stability. If you feel dizzy or unstable, sit down right away.  Return to your normal activities as told by your health care provider. Ask your health care provider what activities are safe for you. General Instructions  Take over-the-counter and prescription medicines only as told by your health care provider.  Avoid certain positions or movements as told by your health care provider.  Drink enough fluid to keep your urine clear or pale yellow.  Keep all follow-up visits as told by your health care provider. This is important. SEEK MEDICAL CARE IF:  You have a fever.  Your condition gets worse or you develop new symptoms.  Your family or friends   notice any behavioral changes.  Your nausea or vomiting gets worse.  You have numbness or a "pins and needles" sensation. SEEK IMMEDIATE MEDICAL CARE IF:  You have difficulty speaking or moving.  You are always dizzy.  You faint.  You develop severe headaches.  You have weakness in your  legs or arms.  You have changes in your hearing or vision.  You develop a stiff neck.  You develop sensitivity to light.   This information is not intended to replace advice given to you by your health care provider. Make sure you discuss any questions you have with your health care provider.   Document Released: 06/09/2006 Document Revised: 05/23/2015 Document Reviewed: 12/25/2014 Elsevier Interactive Patient Education 2016 [employer] Epley Maneuver Self-Care WHAT IS THE EPLEY MANEUVER? The Epley maneuver is an exercise you can do to relieve symptoms of benign paroxysmal positional vertigo (BPPV). This condition is often just referred to as vertigo. BPPV is caused by the movement of tiny crystals (canaliths) inside your inner ear. The accumulation and movement of canaliths in your inner ear causes a sudden spinning sensation (vertigo) when you move your head to certain positions. Vertigo usually lasts about 30 seconds. BPPV usually occurs in just one ear. If you get vertigo when you lie on your left side, you probably have BPPV in your left ear. Your health care provider can tell you which ear is involved.  BPPV may be caused by a head injury. Many people older than 50 get BPPV for unknown reasons. If you have been diagnosed with BPPV, your health care provider may teach you how to do this maneuver. BPPV is not life threatening (benign) and usually goes away in time.  WHEN SHOULD I PERFORM THE EPLEY MANEUVER? You can do this maneuver at home whenever you have symptoms of vertigo. You may do the Epley maneuver up to 3 times a day until your symptoms of vertigo go away. HOW SHOULD I DO THE EPLEY MANEUVER? 3. Sit on the edge of a bed or table with your back straight. Your legs should be extended or hanging over the edge of the bed or table.  4. Turn your head halfway toward the affected ear.  5. Lie backward quickly with your head turned until you are lying flat on your back. You may want to  position a pillow under your shoulders.  6. Hold this position for 30 seconds. You may experience an attack of vertigo. This is normal. Hold this position until the vertigo stops. 7. Then turn your head to the opposite direction until your unaffected ear is facing the floor.  8. Hold this position for 30 seconds. You may experience an attack of vertigo. This is normal. Hold this position until the vertigo stops. 9. Now turn your whole body to the same side as your head. Hold for another 30 seconds.  10. You can then sit back up. ARE THERE RISKS TO THIS MANEUVER? In some cases, you may have other symptoms (such as changes in your vision, weakness, or numbness). If you have these symptoms, stop doing the maneuver and call your health care provider. Even if doing these maneuvers relieves your vertigo, you may still have dizziness. Dizziness is the sensation of light-headedness but without the sensation of movement. Even though the Epley maneuver may relieve your vertigo, it is possible that your symptoms will return within 5 years. WHAT SHOULD I DO AFTER THIS MANEUVER? After doing the Epley maneuver, you can return to your normal   activities. Ask your doctor if there is anything you should do at home to prevent vertigo. This may include:  Sleeping with two or more pillows to keep your head elevated.  Not sleeping on the side of your affected ear.  Getting up slowly from bed.  Avoiding sudden movements during the day.  Avoiding extreme head movement, like looking up or bending over.  Wearing a cervical collar to prevent sudden head movements. WHAT SHOULD I DO IF MY SYMPTOMS GET WORSE? Call your health care provider if your vertigo gets worse. Call your provider right way if you have other symptoms, including:   Nausea.  Vomiting.  Headache.  Weakness.  Numbness.  Vision changes.   This information is not intended to replace advice given to you by your health care provider. Make  sure you discuss any questions you have with your health care provider.   Document Released: 09/06/2013 Document Reviewed: 09/06/2013 Elsevier Interactive Patient Education 2016 Elsevier Inc.  

## 2016-10-06 DIAGNOSIS — D069 Carcinoma in situ of cervix, unspecified: Secondary | ICD-10-CM | POA: Insufficient documentation

## 2016-10-06 HISTORY — DX: Carcinoma in situ of cervix, unspecified: D06.9

## 2017-12-01 ENCOUNTER — Encounter (HOSPITAL_BASED_OUTPATIENT_CLINIC_OR_DEPARTMENT_OTHER): Payer: Self-pay

## 2017-12-01 ENCOUNTER — Other Ambulatory Visit: Payer: Self-pay

## 2017-12-01 DIAGNOSIS — R300 Dysuria: Secondary | ICD-10-CM | POA: Diagnosis present

## 2017-12-01 DIAGNOSIS — Z87891 Personal history of nicotine dependence: Secondary | ICD-10-CM | POA: Insufficient documentation

## 2017-12-01 DIAGNOSIS — N390 Urinary tract infection, site not specified: Secondary | ICD-10-CM | POA: Insufficient documentation

## 2017-12-01 LAB — URINALYSIS, ROUTINE W REFLEX MICROSCOPIC
Bilirubin Urine: NEGATIVE
Glucose, UA: 100 mg/dL — AB
HGB URINE DIPSTICK: NEGATIVE
Ketones, ur: NEGATIVE mg/dL
Leukocytes, UA: NEGATIVE
Nitrite: POSITIVE — AB
Protein, ur: NEGATIVE mg/dL
SPECIFIC GRAVITY, URINE: 1.025 (ref 1.005–1.030)
pH: 5.5 (ref 5.0–8.0)

## 2017-12-01 LAB — URINALYSIS, MICROSCOPIC (REFLEX): RBC / HPF: NONE SEEN RBC/hpf (ref 0–5)

## 2017-12-01 NOTE — ED Triage Notes (Signed)
C/o dysuria x 3 weeks-NAD-steady gait

## 2017-12-02 ENCOUNTER — Emergency Department (HOSPITAL_BASED_OUTPATIENT_CLINIC_OR_DEPARTMENT_OTHER)
Admission: EM | Admit: 2017-12-02 | Discharge: 2017-12-02 | Disposition: A | Discharge status: Home / Self Care | Payer: BLUE CROSS/BLUE SHIELD | Attending: Emergency Medicine | Admitting: Emergency Medicine

## 2017-12-02 DIAGNOSIS — N390 Urinary tract infection, site not specified: Secondary | ICD-10-CM

## 2017-12-02 MED ORDER — NITROFURANTOIN MONOHYD MACRO 100 MG PO CAPS
100.0000 mg | ORAL_CAPSULE | Freq: Once | ORAL | Status: AC
Start: 2017-12-02 — End: 2017-12-02
  Administered 2017-12-02: 100 mg via ORAL
  Filled 2017-12-02: qty 1

## 2017-12-02 MED ORDER — NITROFURANTOIN MONOHYD MACRO 100 MG PO CAPS: 100.0000 mg | ORAL_CAPSULE | Freq: Two times a day (BID) | ORAL | 0 refills | Status: DC

## 2017-12-02 NOTE — ED Provider Notes (Signed)
[facility] Provider Note: Lowella Dell, MD, FACEP  CSN: [id number] MRN: [id number] ARRIVAL: 12/01/17 at 2139 ROOM: MH02/MH02   CHIEF COMPLAINT  Dysuria   HISTORY OF PRESENT ILLNESS  12/02/17 2:54 AM Kelly Hansen is a 52 y.o. female with a 3-week history of dysuria.  By dysuria she means burning with urination along with urinary frequency and a sensation of incomplete voiding.  She has had a low-grade fever with this but no chills.  She has also had low back pain which she rates as an 8 out of 10.  She has been taking over-the-counter phenazopyridine, but the symptoms persist.   Past Medical History:  Diagnosis Date  . Acid reflux   . Depression     Past Surgical History:  Procedure Laterality Date  . ABDOMINAL HYSTERECTOMY    . APPENDECTOMY      No family history on file.  Social History   Tobacco Use  . Smoking status: Former Smoker    Last attempt to quit: 03/10/2011    Years since quitting: 6.7  . Smokeless tobacco: Never Used  Substance Use Topics  . Alcohol use: No  . Drug use: No    Prior to Admission medications   Not on File    Allergies Penicillins and Sulfa antibiotics   REVIEW OF SYSTEMS  Negative except as noted here or in the History of Present Illness.   PHYSICAL EXAMINATION  Initial Vital Signs Blood pressure 110/65, pulse 83, temperature 98.2 F (36.8 C), temperature source Oral, resp. rate (!) 24, height 5\' 2"  (1.575 m), weight 105 kg (231 lb 8 oz), SpO2 99 %.  Examination General: Well-developed, well-nourished female in no acute distress; appearance consistent with age of record HENT: normocephalic; atraumatic Eyes: pupils equal, round and reactive to light; extraocular muscles intact Neck: supple Heart: regular rate and rhythm Lungs: clear to auscultation bilaterally Abdomen: soft; nondistended; nontender; bowel sounds present GU: No CVA tenderness Extremities: No deformity; full range of motion; pulses  normal Neurologic: Awake, alert and oriented; motor function intact in all extremities and symmetric; no facial droop Skin: Warm and dry Psychiatric: Normal mood and affect   RESULTS  Summary of this visit's results, reviewed by myself:   EKG Interpretation  Date/Time:    Ventricular Rate:    PR Interval:    QRS Duration:   QT Interval:    QTC Calculation:   R Axis:     Text Interpretation:        Laboratory Studies: Results for orders placed or performed during the hospital encounter of 12/02/17 (from the past 24 hour(s))  Urinalysis, Routine w reflex microscopic     Status: Abnormal   Collection Time: 12/01/17 10:10 PM  Result Value Ref Range   Color, Urine YELLOW YELLOW   APPearance CLEAR CLEAR   Specific Gravity, Urine 1.025 1.005 - 1.030   pH 5.5 5.0 - 8.0   Glucose, UA 100 (A) NEGATIVE mg/dL   Hgb urine dipstick NEGATIVE NEGATIVE   Bilirubin Urine NEGATIVE NEGATIVE   Ketones, ur NEGATIVE NEGATIVE mg/dL   Protein, ur NEGATIVE NEGATIVE mg/dL   Nitrite POSITIVE (A) NEGATIVE   Leukocytes, UA NEGATIVE NEGATIVE  Urinalysis, Microscopic (reflex)     Status: Abnormal   Collection Time: 12/01/17 10:10 PM  Result Value Ref Range   RBC / HPF NONE SEEN 0 - 5 RBC/hpf   WBC, UA 6-30 0 - 5 WBC/hpf   Bacteria, UA FEW (A) NONE SEEN   Squamous Epithelial /  LPF 0-5 (A) NONE SEEN   Hyaline Casts, UA PRESENT    Imaging Studies: No results found.  ED COURSE  Nursing notes and initial vitals signs, including pulse oximetry, reviewed.  Vitals:   12/01/17 2208  BP: 110/65  Pulse: 83  Resp: (!) 24  Temp: 98.2 F (36.8 C)  TempSrc: Oral  SpO2: 99%  Weight: 105 kg (231 lb 8 oz)  Height: 5\' 2"  (1.575 m)   Urinalysis and symptoms consistent with urinary tract infection.  PROCEDURES    ED DIAGNOSES     ICD-10-CM   1. Lower urinary tract infectious disease N39.0        Tameisha Covell, MD 12/02/17 [id number]

## 2017-12-04 LAB — URINE CULTURE

## 2017-12-05 ENCOUNTER — Telehealth: Payer: Self-pay

## 2017-12-05 NOTE — Telephone Encounter (Signed)
Post ED Visit - Positive Culture Follow-up  Culture report reviewed by antimicrobial stewardship pharmacist:  []  Enzo BiNathan Batchelder, Pharm.D. []  Celedonio MiyamotoJeremy Frens, Pharm.D., BCPS AQ-ID [x]  Garvin FilaMike Maccia, Pharm.D., BCPS []  Georgina PillionElizabeth Martin, Pharm.D., BCPS []  CentraliaMinh Pham, VermontPharm.D., BCPS, AAHIVP []  Estella HuskMichelle Turner, Pharm.D., BCPS, AAHIVP []  Lysle Pearlachel Rumbarger, PharmD, BCPS []  Blake DivineShannon Parkey, PharmD []  Pollyann SamplesAndy Johnston, PharmD, BCPS  Positive urine culture Treated with Nitrofurantoin Monohyd Macro, organism sensitive to the same and no further patient follow-up is required at this time.  Jerry CarasCullom, Timothee Gali Burnett 12/05/2017, 10:52 AM

## 2018-11-16 DIAGNOSIS — J309 Allergic rhinitis, unspecified: Secondary | ICD-10-CM | POA: Diagnosis not present

## 2018-11-16 DIAGNOSIS — Z79899 Other long term (current) drug therapy: Secondary | ICD-10-CM | POA: Diagnosis not present

## 2018-11-16 DIAGNOSIS — Z0000 Encounter for general adult medical examination without abnormal findings: Secondary | ICD-10-CM | POA: Diagnosis not present

## 2018-11-16 DIAGNOSIS — Z131 Encounter for screening for diabetes mellitus: Secondary | ICD-10-CM | POA: Diagnosis not present

## 2018-11-16 DIAGNOSIS — E559 Vitamin D deficiency, unspecified: Secondary | ICD-10-CM | POA: Diagnosis not present

## 2018-11-16 DIAGNOSIS — Z6837 Body mass index (BMI) 37.0-37.9, adult: Secondary | ICD-10-CM | POA: Diagnosis not present

## 2018-11-16 DIAGNOSIS — Z1231 Encounter for screening mammogram for malignant neoplasm of breast: Secondary | ICD-10-CM | POA: Diagnosis not present

## 2018-11-16 MED FILL — VIT D2 1.25 MG (50,000 UNIT: 1.25 MG | 84 days supply | Qty: 12 | Fill #0

## 2018-11-16 MED FILL — BACLOFEN 20 MG TABLET: 20 | 30 days supply | Qty: 60 | Fill #0

## 2018-11-16 MED FILL — LEVOTHYROXINE 25 MCG TABLET: 25 | 60 days supply | Qty: 30 | Fill #0

## 2018-11-16 MED FILL — OXYBUTYNIN CL ER 10 MG TAB: 10 | 90 days supply | Qty: 90 | Fill #0

## 2018-11-16 MED FILL — PANTOPRAZOLE SOD DR 40 MG T: 40 | 30 days supply | Qty: 30 | Fill #0

## 2018-11-16 MED FILL — DONEPEZIL HCL 10 MG TABLET: 10 | 30 days supply | Qty: 30 | Fill #0

## 2018-11-16 MED FILL — GABAPENTIN 100 MG CAPSULE: 100 | 37 days supply | Qty: 90 | Fill #0

## 2018-11-16 MED FILL — MONTELUKAST SOD 10 MG TAB: 10 | 90 days supply | Qty: 90 | Fill #0

## 2018-11-16 MED FILL — DULOXETINE HCL 60 MG CPEP: 60 | 30 days supply | Qty: 30 | Fill #0

## 2018-11-16 MED FILL — FLUTICASONE PROP 50 MCG SPR: 50 | 90 days supply | Qty: 48 | Fill #0

## 2018-11-17 DIAGNOSIS — Z0000 Encounter for general adult medical examination without abnormal findings: Secondary | ICD-10-CM | POA: Diagnosis not present

## 2018-11-17 DIAGNOSIS — Z131 Encounter for screening for diabetes mellitus: Secondary | ICD-10-CM | POA: Diagnosis not present

## 2018-11-17 DIAGNOSIS — E559 Vitamin D deficiency, unspecified: Secondary | ICD-10-CM | POA: Diagnosis not present

## 2018-12-14 MED FILL — LEVOTHYROXINE 25 MCG TABLET: 25 | 30 days supply | Qty: 30 | Fill #0

## 2018-12-14 MED FILL — DULOXETINE HCL 60 MG CPEP: 60 | 30 days supply | Qty: 30 | Fill #0

## 2018-12-14 MED FILL — DONEPEZIL HCL 10 MG TABLET: 10 | 30 days supply | Qty: 30 | Fill #1

## 2018-12-14 MED FILL — PANTOPRAZOLE SOD DR 40 MG T: 40 | 30 days supply | Qty: 30 | Fill #0

## 2019-01-12 DIAGNOSIS — K649 Unspecified hemorrhoids: Secondary | ICD-10-CM | POA: Diagnosis not present

## 2019-01-12 DIAGNOSIS — M5440 Lumbago with sciatica, unspecified side: Secondary | ICD-10-CM | POA: Diagnosis not present

## 2019-01-12 DIAGNOSIS — J309 Allergic rhinitis, unspecified: Secondary | ICD-10-CM | POA: Diagnosis not present

## 2019-01-12 DIAGNOSIS — K529 Noninfective gastroenteritis and colitis, unspecified: Secondary | ICD-10-CM | POA: Diagnosis not present

## 2019-01-12 DIAGNOSIS — M159 Polyosteoarthritis, unspecified: Secondary | ICD-10-CM | POA: Diagnosis not present

## 2019-01-12 MED FILL — GABAPENTIN 100 MG CAPSULE: 100 | 15 days supply | Qty: 90 | Fill #0

## 2019-01-12 MED FILL — BACLOFEN 20 MG TABLET: 20 | 30 days supply | Qty: 60 | Fill #0

## 2019-01-13 MED FILL — PROCTOZONE-HC 2.5 % CREA: 2.5 | 30 days supply | Qty: 60 | Fill #0

## 2019-01-17 DIAGNOSIS — R197 Diarrhea, unspecified: Secondary | ICD-10-CM | POA: Diagnosis not present

## 2019-01-17 MED FILL — VIT D2 1.25 MG (50,000 UNIT: 1.25 MG | 28 days supply | Qty: 4 | Fill #0

## 2019-01-17 MED FILL — SUPREP BOWEL PREP KIT: 17.5-3.13-1 | 1 days supply | Qty: 354 | Fill #0

## 2019-01-17 MED FILL — DULOXETINE HCL 60 MG CPEP: 60 | 30 days supply | Qty: 30 | Fill #0

## 2019-01-17 MED FILL — DIPHENOXYLATE-ATROPINE 2.5-: 2.5-0.025 | 30 days supply | Qty: 120 | Fill #0

## 2019-01-17 MED FILL — DONEPEZIL HCL 10 MG TABLET: 10 | 30 days supply | Qty: 30 | Fill #0

## 2019-01-28 DIAGNOSIS — K52832 Lymphocytic colitis: Secondary | ICD-10-CM | POA: Diagnosis not present

## 2019-01-28 DIAGNOSIS — R197 Diarrhea, unspecified: Secondary | ICD-10-CM | POA: Diagnosis not present

## 2019-01-28 DIAGNOSIS — Z800 Family history of malignant neoplasm of digestive organs: Secondary | ICD-10-CM | POA: Diagnosis not present

## 2019-01-28 LAB — HM COLONOSCOPY

## 2019-02-01 DIAGNOSIS — K52839 Microscopic colitis, unspecified: Secondary | ICD-10-CM | POA: Diagnosis not present

## 2019-02-01 MED FILL — BALSALAZIDE DISODIUM 750 MG: 750 | 30 days supply | Qty: 270 | Fill #0

## 2019-02-14 MED FILL — PANTOPRAZOLE SOD DR 40 MG T: 40 | 30 days supply | Qty: 30 | Fill #1

## 2019-02-16 DIAGNOSIS — E559 Vitamin D deficiency, unspecified: Secondary | ICD-10-CM | POA: Diagnosis not present

## 2019-02-16 DIAGNOSIS — R413 Other amnesia: Secondary | ICD-10-CM | POA: Diagnosis not present

## 2019-02-16 DIAGNOSIS — M5440 Lumbago with sciatica, unspecified side: Secondary | ICD-10-CM | POA: Diagnosis not present

## 2019-02-16 DIAGNOSIS — K529 Noninfective gastroenteritis and colitis, unspecified: Secondary | ICD-10-CM | POA: Diagnosis not present

## 2019-02-16 DIAGNOSIS — K649 Unspecified hemorrhoids: Secondary | ICD-10-CM | POA: Diagnosis not present

## 2019-02-16 DIAGNOSIS — R5382 Chronic fatigue, unspecified: Secondary | ICD-10-CM | POA: Diagnosis not present

## 2019-02-16 DIAGNOSIS — M159 Polyosteoarthritis, unspecified: Secondary | ICD-10-CM | POA: Diagnosis not present

## 2019-02-16 MED FILL — VIT D2 1.25 MG (50,000 UNIT: 1.25 MG | 84 days supply | Qty: 12 | Fill #0

## 2019-02-16 MED FILL — predniSONE 10 MG (21) TBPK: 10 | 6 days supply | Qty: 21 | Fill #0

## 2019-02-16 MED FILL — MELOXICAM 15 MG TABLET: 15 | 30 days supply | Qty: 30 | Fill #0

## 2019-02-16 MED FILL — DULoxetine HCL 30 MG CPEP: 30 | 30 days supply | Qty: 90 | Fill #0

## 2019-02-16 MED FILL — BACLOFEN 20 MG TABS: 20 | 60 days supply | Qty: 60 | Fill #0

## 2019-02-16 MED FILL — DONEPEZIL HCL 10 MG TABLET: 10 | 90 days supply | Qty: 90 | Fill #0

## 2019-02-16 MED FILL — GABAPENTIN 300 MG CAPSULE: 300 | 30 days supply | Qty: 90 | Fill #0

## 2019-02-17 DIAGNOSIS — K52839 Microscopic colitis, unspecified: Secondary | ICD-10-CM | POA: Diagnosis not present

## 2019-02-17 MED FILL — UCERIS 9 MG ER TABLET: 9 | 30 days supply | Qty: 30 | Fill #0

## 2019-02-23 DIAGNOSIS — Z1231 Encounter for screening mammogram for malignant neoplasm of breast: Secondary | ICD-10-CM | POA: Diagnosis not present

## 2019-03-02 DIAGNOSIS — M79605 Pain in left leg: Secondary | ICD-10-CM | POA: Diagnosis not present

## 2019-03-02 DIAGNOSIS — M79604 Pain in right leg: Secondary | ICD-10-CM | POA: Diagnosis not present

## 2019-03-02 DIAGNOSIS — M2560 Stiffness of unspecified joint, not elsewhere classified: Secondary | ICD-10-CM | POA: Diagnosis not present

## 2019-03-02 DIAGNOSIS — M6281 Muscle weakness (generalized): Secondary | ICD-10-CM | POA: Diagnosis not present

## 2019-03-02 DIAGNOSIS — R2689 Other abnormalities of gait and mobility: Secondary | ICD-10-CM | POA: Diagnosis not present

## 2019-03-02 DIAGNOSIS — R201 Hypoesthesia of skin: Secondary | ICD-10-CM | POA: Diagnosis not present

## 2019-03-02 DIAGNOSIS — R293 Abnormal posture: Secondary | ICD-10-CM | POA: Diagnosis not present

## 2019-03-02 DIAGNOSIS — M5440 Lumbago with sciatica, unspecified side: Secondary | ICD-10-CM | POA: Diagnosis not present

## 2019-03-03 DIAGNOSIS — M5440 Lumbago with sciatica, unspecified side: Secondary | ICD-10-CM | POA: Diagnosis not present

## 2019-03-03 DIAGNOSIS — R252 Cramp and spasm: Secondary | ICD-10-CM | POA: Diagnosis not present

## 2019-03-03 DIAGNOSIS — K529 Noninfective gastroenteritis and colitis, unspecified: Secondary | ICD-10-CM | POA: Diagnosis not present

## 2019-03-09 DIAGNOSIS — M79604 Pain in right leg: Secondary | ICD-10-CM | POA: Diagnosis not present

## 2019-03-09 DIAGNOSIS — R2689 Other abnormalities of gait and mobility: Secondary | ICD-10-CM | POA: Diagnosis not present

## 2019-03-09 DIAGNOSIS — M79605 Pain in left leg: Secondary | ICD-10-CM | POA: Diagnosis not present

## 2019-03-09 DIAGNOSIS — M6281 Muscle weakness (generalized): Secondary | ICD-10-CM | POA: Diagnosis not present

## 2019-03-09 DIAGNOSIS — M2560 Stiffness of unspecified joint, not elsewhere classified: Secondary | ICD-10-CM | POA: Diagnosis not present

## 2019-03-09 DIAGNOSIS — M5440 Lumbago with sciatica, unspecified side: Secondary | ICD-10-CM | POA: Diagnosis not present

## 2019-03-09 DIAGNOSIS — R201 Hypoesthesia of skin: Secondary | ICD-10-CM | POA: Diagnosis not present

## 2019-03-09 DIAGNOSIS — R293 Abnormal posture: Secondary | ICD-10-CM | POA: Diagnosis not present

## 2019-03-17 DIAGNOSIS — M159 Polyosteoarthritis, unspecified: Secondary | ICD-10-CM | POA: Diagnosis not present

## 2019-03-17 DIAGNOSIS — J309 Allergic rhinitis, unspecified: Secondary | ICD-10-CM | POA: Diagnosis not present

## 2019-03-17 DIAGNOSIS — F329 Major depressive disorder, single episode, unspecified: Secondary | ICD-10-CM | POA: Diagnosis not present

## 2019-03-17 DIAGNOSIS — R201 Hypoesthesia of skin: Secondary | ICD-10-CM | POA: Diagnosis not present

## 2019-03-17 DIAGNOSIS — M5440 Lumbago with sciatica, unspecified side: Secondary | ICD-10-CM | POA: Diagnosis not present

## 2019-03-17 DIAGNOSIS — K529 Noninfective gastroenteritis and colitis, unspecified: Secondary | ICD-10-CM | POA: Diagnosis not present

## 2019-03-17 DIAGNOSIS — M79604 Pain in right leg: Secondary | ICD-10-CM | POA: Diagnosis not present

## 2019-03-17 DIAGNOSIS — R252 Cramp and spasm: Secondary | ICD-10-CM | POA: Diagnosis not present

## 2019-03-17 DIAGNOSIS — R2689 Other abnormalities of gait and mobility: Secondary | ICD-10-CM | POA: Diagnosis not present

## 2019-03-17 DIAGNOSIS — N3281 Overactive bladder: Secondary | ICD-10-CM | POA: Diagnosis not present

## 2019-03-17 DIAGNOSIS — M2560 Stiffness of unspecified joint, not elsewhere classified: Secondary | ICD-10-CM | POA: Diagnosis not present

## 2019-03-17 DIAGNOSIS — E559 Vitamin D deficiency, unspecified: Secondary | ICD-10-CM | POA: Diagnosis not present

## 2019-03-17 DIAGNOSIS — R293 Abnormal posture: Secondary | ICD-10-CM | POA: Diagnosis not present

## 2019-03-17 DIAGNOSIS — G629 Polyneuropathy, unspecified: Secondary | ICD-10-CM | POA: Diagnosis not present

## 2019-03-17 DIAGNOSIS — M6281 Muscle weakness (generalized): Secondary | ICD-10-CM | POA: Diagnosis not present

## 2019-03-17 MED FILL — FUROSEMIDE 20 MG TABS: 20 | 15 days supply | Qty: 30 | Fill #0

## 2019-03-17 MED FILL — DULoxetine HCL 30 MG CPEP: 30 | 30 days supply | Qty: 90 | Fill #1

## 2019-03-17 MED FILL — PANTOPRAZOLE SOD DR 40 MG T: 40 | 30 days supply | Qty: 30 | Fill #2

## 2019-03-17 MED FILL — MONTELUKAST SOD 10 MG TAB: 10 | 90 days supply | Qty: 90 | Fill #0

## 2019-03-17 MED FILL — ROSUVASTATIN CALCIUM 10 MG: 10 | 30 days supply | Qty: 27 | Fill #0

## 2019-03-17 MED FILL — POTASSIUM CHL ER M10 TABLET: 10 | 15 days supply | Qty: 30 | Fill #0

## 2019-03-17 MED FILL — PRAMIPEXOLE 0.125 MG TABLET: 0.125 | 30 days supply | Qty: 60 | Fill #0

## 2019-03-17 MED FILL — MELOXICAM 15 MG TABLET: 15 | 30 days supply | Qty: 30 | Fill #1

## 2019-03-17 MED FILL — OXYBUTYNIN CL ER 10 MG TAB: 10 | 90 days supply | Qty: 90 | Fill #0

## 2019-03-21 DIAGNOSIS — E785 Hyperlipidemia, unspecified: Secondary | ICD-10-CM | POA: Diagnosis not present

## 2019-03-21 DIAGNOSIS — R2689 Other abnormalities of gait and mobility: Secondary | ICD-10-CM | POA: Diagnosis not present

## 2019-03-21 DIAGNOSIS — R201 Hypoesthesia of skin: Secondary | ICD-10-CM | POA: Diagnosis not present

## 2019-03-21 DIAGNOSIS — M79604 Pain in right leg: Secondary | ICD-10-CM | POA: Diagnosis not present

## 2019-03-21 DIAGNOSIS — M5440 Lumbago with sciatica, unspecified side: Secondary | ICD-10-CM | POA: Diagnosis not present

## 2019-03-21 DIAGNOSIS — M6281 Muscle weakness (generalized): Secondary | ICD-10-CM | POA: Diagnosis not present

## 2019-03-21 DIAGNOSIS — E039 Hypothyroidism, unspecified: Secondary | ICD-10-CM | POA: Diagnosis not present

## 2019-03-21 DIAGNOSIS — M2560 Stiffness of unspecified joint, not elsewhere classified: Secondary | ICD-10-CM | POA: Diagnosis not present

## 2019-03-21 DIAGNOSIS — E559 Vitamin D deficiency, unspecified: Secondary | ICD-10-CM | POA: Diagnosis not present

## 2019-03-21 DIAGNOSIS — R293 Abnormal posture: Secondary | ICD-10-CM | POA: Diagnosis not present

## 2019-03-21 DIAGNOSIS — Z79899 Other long term (current) drug therapy: Secondary | ICD-10-CM | POA: Diagnosis not present

## 2019-03-28 DIAGNOSIS — M79604 Pain in right leg: Secondary | ICD-10-CM | POA: Diagnosis not present

## 2019-03-28 DIAGNOSIS — R201 Hypoesthesia of skin: Secondary | ICD-10-CM | POA: Diagnosis not present

## 2019-03-28 DIAGNOSIS — M5440 Lumbago with sciatica, unspecified side: Secondary | ICD-10-CM | POA: Diagnosis not present

## 2019-03-28 DIAGNOSIS — R2689 Other abnormalities of gait and mobility: Secondary | ICD-10-CM | POA: Diagnosis not present

## 2019-03-28 DIAGNOSIS — M2560 Stiffness of unspecified joint, not elsewhere classified: Secondary | ICD-10-CM | POA: Diagnosis not present

## 2019-03-28 DIAGNOSIS — R293 Abnormal posture: Secondary | ICD-10-CM | POA: Diagnosis not present

## 2019-03-28 DIAGNOSIS — M6281 Muscle weakness (generalized): Secondary | ICD-10-CM | POA: Diagnosis not present

## 2019-04-14 MED FILL — GABAPENTIN 300 MG CAPSULE: 300 | 30 days supply | Qty: 90 | Fill #0

## 2019-04-14 MED FILL — ROSUVASTATIN CALCIUM 10 MG: 10 | 33 days supply | Qty: 30 | Fill #1

## 2019-04-15 MED FILL — LEVOTHYROXINE 25 MCG TABLET: 25 | 30 days supply | Qty: 30 | Fill #1

## 2019-04-19 MED FILL — DULoxetine HCL 30 MG CPEP: 30 | 30 days supply | Qty: 90 | Fill #0

## 2019-04-19 MED FILL — MELOXICAM 15 MG TABLET: 15 | 30 days supply | Qty: 30 | Fill #0

## 2019-04-19 MED FILL — PANTOPRAZOLE SOD DR 40 MG T: 40 | 30 days supply | Qty: 30 | Fill #0

## 2019-04-21 DIAGNOSIS — M545 Low back pain: Secondary | ICD-10-CM | POA: Diagnosis not present

## 2019-04-21 MED FILL — DICLOFENAC SODIUM 75 MG TAB: 75 | 30 days supply | Qty: 60 | Fill #0

## 2019-05-23 MED FILL — MELOXICAM 15 MG TABLET: 15 | 30 days supply | Qty: 30 | Fill #1

## 2019-05-23 MED FILL — DULoxetine HCL 30 MG CPEP: 30 | 30 days supply | Qty: 90 | Fill #1

## 2019-05-24 MED FILL — ROSUVASTATIN CALCIUM 10 MG: 10 | 30 days supply | Qty: 30 | Fill #0

## 2019-05-24 MED FILL — VIT D2 1.25 MG (50,000 UNIT: 1.25 MG | 84 days supply | Qty: 12 | Fill #0

## 2019-05-25 DIAGNOSIS — F329 Major depressive disorder, single episode, unspecified: Secondary | ICD-10-CM | POA: Diagnosis not present

## 2019-05-25 DIAGNOSIS — R413 Other amnesia: Secondary | ICD-10-CM | POA: Diagnosis not present

## 2019-05-25 DIAGNOSIS — M5440 Lumbago with sciatica, unspecified side: Secondary | ICD-10-CM | POA: Diagnosis not present

## 2019-05-25 DIAGNOSIS — N3281 Overactive bladder: Secondary | ICD-10-CM | POA: Diagnosis not present

## 2019-05-25 DIAGNOSIS — E559 Vitamin D deficiency, unspecified: Secondary | ICD-10-CM | POA: Diagnosis not present

## 2019-05-25 DIAGNOSIS — R252 Cramp and spasm: Secondary | ICD-10-CM | POA: Diagnosis not present

## 2019-05-25 DIAGNOSIS — J309 Allergic rhinitis, unspecified: Secondary | ICD-10-CM | POA: Diagnosis not present

## 2019-05-25 MED FILL — MEMANTINE 5-10 MG TITRATION: 28 X 5 MG & | 30 days supply | Qty: 49 | Fill #0

## 2019-05-26 MED FILL — BACLOFEN 20 MG TABLET: 20 | 30 days supply | Qty: 60 | Fill #0

## 2019-05-26 MED FILL — DULOXETINE HCL 60 MG CPEP: 60 | 90 days supply | Qty: 90 | Fill #0

## 2019-05-26 MED FILL — PRAMIPEXOLE 0.125 MG TABLET: 0.125 | 30 days supply | Qty: 60 | Fill #0

## 2019-05-26 MED FILL — GABAPENTIN 300 MG CAPSULE: 300 | 30 days supply | Qty: 90 | Fill #0

## 2019-05-27 MED FILL — PANTOPRAZOLE SOD DR 40 MG T: 40 | 30 days supply | Qty: 30 | Fill #1

## 2019-06-01 MED FILL — DONEPEZIL HCL 10 MG TABLET: 10 | 30 days supply | Qty: 30 | Fill #1

## 2019-06-01 MED FILL — DICLOFENAC SODIUM 75 MG TAB: 75 | 30 days supply | Qty: 60 | Fill #1

## 2019-06-14 DIAGNOSIS — J309 Allergic rhinitis, unspecified: Secondary | ICD-10-CM | POA: Diagnosis not present

## 2019-06-14 DIAGNOSIS — E785 Hyperlipidemia, unspecified: Secondary | ICD-10-CM | POA: Diagnosis not present

## 2019-06-14 DIAGNOSIS — K580 Irritable bowel syndrome with diarrhea: Secondary | ICD-10-CM | POA: Diagnosis not present

## 2019-06-14 DIAGNOSIS — Z6841 Body Mass Index (BMI) 40.0 and over, adult: Secondary | ICD-10-CM | POA: Diagnosis not present

## 2019-06-14 DIAGNOSIS — Z79899 Other long term (current) drug therapy: Secondary | ICD-10-CM | POA: Diagnosis not present

## 2019-06-14 DIAGNOSIS — K649 Unspecified hemorrhoids: Secondary | ICD-10-CM | POA: Diagnosis not present

## 2019-06-14 DIAGNOSIS — E039 Hypothyroidism, unspecified: Secondary | ICD-10-CM | POA: Diagnosis not present

## 2019-06-14 DIAGNOSIS — M159 Polyosteoarthritis, unspecified: Secondary | ICD-10-CM | POA: Diagnosis not present

## 2019-09-22 DIAGNOSIS — R0789 Other chest pain: Secondary | ICD-10-CM | POA: Diagnosis not present

## 2019-09-22 DIAGNOSIS — E559 Vitamin D deficiency, unspecified: Secondary | ICD-10-CM | POA: Diagnosis not present

## 2019-09-22 DIAGNOSIS — J309 Allergic rhinitis, unspecified: Secondary | ICD-10-CM | POA: Diagnosis not present

## 2019-09-22 DIAGNOSIS — M5440 Lumbago with sciatica, unspecified side: Secondary | ICD-10-CM | POA: Diagnosis not present

## 2019-09-22 DIAGNOSIS — Z23 Encounter for immunization: Secondary | ICD-10-CM | POA: Diagnosis not present

## 2019-09-22 DIAGNOSIS — Z79899 Other long term (current) drug therapy: Secondary | ICD-10-CM | POA: Diagnosis not present

## 2019-09-22 DIAGNOSIS — Z131 Encounter for screening for diabetes mellitus: Secondary | ICD-10-CM | POA: Diagnosis not present

## 2019-09-22 DIAGNOSIS — M159 Polyosteoarthritis, unspecified: Secondary | ICD-10-CM | POA: Diagnosis not present

## 2019-09-22 DIAGNOSIS — F329 Major depressive disorder, single episode, unspecified: Secondary | ICD-10-CM | POA: Diagnosis not present

## 2019-09-22 DIAGNOSIS — Z6841 Body Mass Index (BMI) 40.0 and over, adult: Secondary | ICD-10-CM | POA: Diagnosis not present

## 2019-09-22 DIAGNOSIS — E039 Hypothyroidism, unspecified: Secondary | ICD-10-CM | POA: Diagnosis not present

## 2019-09-22 DIAGNOSIS — G629 Polyneuropathy, unspecified: Secondary | ICD-10-CM | POA: Diagnosis not present

## 2019-09-22 DIAGNOSIS — E785 Hyperlipidemia, unspecified: Secondary | ICD-10-CM | POA: Diagnosis not present

## 2019-09-25 DIAGNOSIS — R05 Cough: Secondary | ICD-10-CM | POA: Diagnosis not present

## 2019-09-25 DIAGNOSIS — U071 COVID-19: Secondary | ICD-10-CM | POA: Diagnosis not present

## 2019-09-27 MED FILL — PRAMIPEXOLE 0.125 MG TABLET: 0.125 | 30 days supply | Qty: 60 | Fill #0

## 2019-09-27 MED FILL — DULoxetine HCL 30 MG CPEP: 30 | 30 days supply | Qty: 90 | Fill #0

## 2019-09-27 MED FILL — ROSUVASTATIN CALCIUM 10 MG: 10 | 90 days supply | Qty: 90 | Fill #0

## 2019-09-27 MED FILL — VIT D2 1.25 MG (50,000 UNIT: 1.25 MG | 84 days supply | Qty: 12 | Fill #0

## 2019-09-27 MED FILL — PANTOPRAZOLE SOD DR 40 MG T: 40 | 90 days supply | Qty: 90 | Fill #0

## 2019-09-27 MED FILL — MONTELUKAST SOD 10 MG TAB: 10 | 90 days supply | Qty: 90 | Fill #0

## 2019-09-27 MED FILL — OXYBUTYNIN CL ER 10 MG TAB: 10 | 90 days supply | Qty: 90 | Fill #0

## 2019-09-27 MED FILL — MEMANTINE HCL 10 MG TABS: 10 | 90 days supply | Qty: 90 | Fill #0

## 2019-09-27 MED FILL — BACLOFEN 20 MG TABS: 20 | 30 days supply | Qty: 60 | Fill #0

## 2019-09-28 MED FILL — DONEPEZIL HCL 10 MG TABLET: 10 | 90 days supply | Qty: 90 | Fill #0

## 2019-09-28 MED FILL — POTASSIUM CHLORIDE CRYS ER: 10 | 15 days supply | Qty: 30 | Fill #0

## 2019-09-28 MED FILL — FUROSEMIDE 20 MG TABS: 20 | 15 days supply | Qty: 30 | Fill #0

## 2019-09-28 MED FILL — DICLOFENAC SODIUM 75 MG TAB: 75 | 30 days supply | Qty: 30 | Fill #0

## 2019-10-04 ENCOUNTER — Emergency Department (HOSPITAL_COMMUNITY)
Admission: EM | Admit: 2019-10-04 | Discharge: 2019-10-04 | Disposition: A | Discharge status: Home / Self Care | Payer: 59 | Attending: Emergency Medicine | Admitting: Emergency Medicine

## 2019-10-04 ENCOUNTER — Emergency Department (HOSPITAL_COMMUNITY): Payer: 59

## 2019-10-04 ENCOUNTER — Other Ambulatory Visit: Payer: Self-pay

## 2019-10-04 ENCOUNTER — Encounter (HOSPITAL_COMMUNITY): Payer: Self-pay | Admitting: Emergency Medicine

## 2019-10-04 DIAGNOSIS — R197 Diarrhea, unspecified: Secondary | ICD-10-CM

## 2019-10-04 DIAGNOSIS — J1282 Pneumonia due to coronavirus disease 2019: Secondary | ICD-10-CM

## 2019-10-04 DIAGNOSIS — J189 Pneumonia, unspecified organism: Secondary | ICD-10-CM | POA: Insufficient documentation

## 2019-10-04 DIAGNOSIS — U071 COVID-19: Secondary | ICD-10-CM | POA: Diagnosis not present

## 2019-10-04 DIAGNOSIS — Z87891 Personal history of nicotine dependence: Secondary | ICD-10-CM | POA: Diagnosis not present

## 2019-10-04 DIAGNOSIS — R0602 Shortness of breath: Secondary | ICD-10-CM | POA: Diagnosis not present

## 2019-10-04 DIAGNOSIS — Z79899 Other long term (current) drug therapy: Secondary | ICD-10-CM | POA: Diagnosis not present

## 2019-10-04 LAB — COMPREHENSIVE METABOLIC PANEL
ALT: 31 U/L (ref 0–44)
AST: 36 U/L (ref 15–41)
Albumin: 4.4 g/dL (ref 3.5–5.0)
Alkaline Phosphatase: 66 U/L (ref 38–126)
Anion gap: 12 (ref 5–15)
BUN: 14 mg/dL (ref 6–20)
CO2: 23 mmol/L (ref 22–32)
Calcium: 10 mg/dL (ref 8.9–10.3)
Chloride: 108 mmol/L (ref 98–111)
Creatinine, Ser: 0.99 mg/dL (ref 0.44–1.00)
GFR calc Af Amer: >60 mL/min (ref >60)
GFR calc non Af Amer: >60 mL/min (ref >60)
Glucose, Bld: 104 mg/dL — ABNORMAL HIGH (ref 70–99)
Potassium: 4.3 mmol/L (ref 3.5–5.1)
Sodium: 143 mmol/L (ref 135–145)
Total Bilirubin: 1.1 mg/dL (ref 0.3–1.2)
Total Protein: 7.9 g/dL (ref 6.5–8.1)

## 2019-10-04 LAB — TROPONIN I (HIGH SENSITIVITY)
Troponin I (High Sensitivity): 3 ng/L (ref <18)
Troponin I (High Sensitivity): 3 ng/L (ref <18)

## 2019-10-04 LAB — URINALYSIS, ROUTINE W REFLEX MICROSCOPIC
Bilirubin Urine: NEGATIVE
Glucose, UA: NEGATIVE mg/dL
Hgb urine dipstick: NEGATIVE
Ketones, ur: 20 mg/dL — AB
Leukocytes,Ua: NEGATIVE
Nitrite: NEGATIVE
Protein, ur: 30 mg/dL — AB
Specific Gravity, Urine: 1.026 (ref 1.005–1.030)
pH: 6 (ref 5.0–8.0)

## 2019-10-04 LAB — CBC
HCT: 45.1 % (ref 36.0–46.0)
Hemoglobin: 14.8 g/dL (ref 12.0–15.0)
MCH: 31.6 pg (ref 26.0–34.0)
MCHC: 32.8 g/dL (ref 30.0–36.0)
MCV: 96.2 fL (ref 80.0–100.0)
Platelets: 298 10*3/uL (ref 150–400)
RBC: 4.69 MIL/uL (ref 3.87–5.11)
RDW: 13 % (ref 11.5–15.5)
WBC: 4.9 10*3/uL (ref 4.0–10.5)
nRBC: 0 % (ref 0.0–0.2)

## 2019-10-04 LAB — C DIFFICILE QUICK SCREEN W PCR REFLEX
C Diff antigen: NEGATIVE
C Diff interpretation: NOT DETECTED
C Diff toxin: NEGATIVE

## 2019-10-04 LAB — LIPASE, BLOOD: Lipase: 21 U/L (ref 11–51)

## 2019-10-04 LAB — I-STAT BETA HCG BLOOD, ED (MC, WL, AP ONLY): I-stat hCG, quantitative: <5 m[IU]/mL (ref <5)

## 2019-10-04 MED ORDER — LOPERAMIDE HCL 2 MG PO CAPS
2.0000 mg | ORAL_CAPSULE | Freq: Once | ORAL | Status: AC
  Administered 2019-10-04: 2 mg via ORAL
  Filled 2019-10-04: qty 1

## 2019-10-04 MED ORDER — LOPERAMIDE HCL 2 MG PO CAPS: 2.0000 mg | ORAL_CAPSULE | Freq: Four times a day (QID) | ORAL | 0 refills | Status: DC | PRN

## 2019-10-04 MED ORDER — SODIUM CHLORIDE 0.9% FLUSH
3.0000 mL | Freq: Once | INTRAVENOUS | Status: AC
  Administered 2019-10-04: 19:00:00 3 mL via INTRAVENOUS

## 2019-10-04 MED ORDER — ONDANSETRON HCL 4 MG/2ML IJ SOLN
4.0000 mg | Freq: Once | INTRAMUSCULAR | Status: AC
  Administered 2019-10-04: 21:00:00 4 mg via INTRAVENOUS
  Filled 2019-10-04: qty 2

## 2019-10-04 MED ORDER — SODIUM CHLORIDE 0.9 % IV BOLUS
1000.0000 mL | Freq: Once | INTRAVENOUS | Status: AC
  Administered 2019-10-04: 19:00:00 1000 mL via INTRAVENOUS

## 2019-10-04 MED ORDER — SODIUM CHLORIDE 0.9 % IV BOLUS
500.0000 mL | Freq: Once | INTRAVENOUS | Status: AC
  Administered 2019-10-04: 500 mL via INTRAVENOUS

## 2019-10-04 NOTE — Discharge Instructions (Addendum)
As we discussed, your symptoms are most likely are likely due to continued COVID-19.  Your blood work today was reassuring.  Make sure you are drinking plenty of fluids and staying hydrated.  I have sent you home on some Lomotil or Imodium to help with diarrhea.  You also have a stool test pending.  If you are positive, you will be notified.  Return the emergency department for any worsening difficulty breathing, inability to eat or drink, vomiting, persistent fever or any other worsening or concerning symptoms.

## 2019-10-04 NOTE — ED Provider Notes (Signed)
[facility]-EMERGENCY DEPT Provider Note   CSN: [id number] Arrival date & time: 10/04/19  1115     History Chief Complaint  Patient presents with  . Covid positive  . Diarrhea    Kelly Hansen is a 54 y.o. female possible history of acid reflux, depression who presents for evaluation of diarrhea, fatigue, cough, shortness of breath.  She reports that she was diagnosed with Covid on 09/25/2019.  She states that she had had symptoms for about 3 to 4 days prior to that.  She states that she comes in today because she is still not feeling better.  She states she has had multiple episodes of diarrhea today.  She reports that stool is liquid and no melena, bright red blood.  She also reports that she is continued to be fatigued and congested.  She has had a cough that is productive of phlegm.  She feels like she is clogged up in her chest and her throat which contributes to her shortness of breath.  He states she still had subjective fever/chills.  She reports occasionally, she will have some burning in her upper abdomen.  She states that everything just feels sore.  She does not smoke.  She has no history of asthma or COPD. She denies any OCP use, recent immobilization, prior history of DVT/PE, recent surgery, leg swelling, or long travel.  The history is provided by the patient.       Past Medical History:  Diagnosis Date  . Acid reflux   . Depression     There are no problems to display for this patient.   Past Surgical History:  Procedure Laterality Date  . ABDOMINAL HYSTERECTOMY    . APPENDECTOMY       OB History   No obstetric history on file.     No family history on file.  Social History   Tobacco Use  . Smoking status: Former Smoker    Quit date: 03/10/2011    Years since quitting: 8.5  . Smokeless tobacco: Never Used  Substance Use Topics  . Alcohol use: No  . Drug use: No    Home Medications Prior to Admission medications   Medication  Sig Start Date End Date Taking? Authorizing Provider  baclofen (LIORESAL) 20 MG tablet Take 20 mg by mouth 2 (two) times daily as needed for mild pain. 05/26/19  Yes [provider]  diclofenac (VOLTAREN) 75 MG EC tablet Take 75 mg by mouth 2 (two) times daily. 06/01/19  Yes [provider]  donepezil (ARICEPT) 10 MG tablet Take 10 mg by mouth daily. 06/01/19  Yes [provider]  DULoxetine (CYMBALTA) 60 MG capsule Take 60 mg by mouth daily. 05/26/19  Yes [provider]  ergocalciferol (VITAMIN D2) 1.25 MG (50000 UT) capsule Take 50,000 Units by mouth once a week. Thursday   Yes [provider]  fluticasone (FLOVENT DISKUS) 50 MCG/BLIST diskus inhaler Inhale 1 puff into the lungs 2 (two) times daily.   Yes [provider]  furosemide (LASIX) 20 MG tablet Take 20 mg by mouth daily.   Yes [provider]  gabapentin (NEURONTIN) 300 MG capsule Take 300 mg by mouth 3 (three) times daily. 05/26/19  Yes [provider]  levothyroxine (SYNTHROID) 25 MCG tablet Take 50 mcg by mouth daily. 04/15/19  Yes [provider]  Meclizine HCl 25 MG CHEW Chew 25 mg by mouth 3 (three) times daily as needed for dizziness.   Yes [provider]  meloxicam (MOBIC) 15 MG tablet Take 15 mg by mouth daily. 05/22/19  Yes [provider]  montelukast (SINGULAIR) 10 MG tablet Take 10 mg by mouth at bedtime.   Yes [provider]  pantoprazole (PROTONIX) 40 MG tablet Take 40 mg by mouth daily.   Yes [provider]  pramipexole (MIRAPEX) 0.125 MG tablet Take 0.125 mg by mouth 2 (two) times daily. 05/26/19  Yes [provider]  rosuvastatin (CRESTOR) 10 MG tablet Take 10 mg by mouth daily. 05/24/19  Yes [provider]  UCERIS 9 MG TB24 Take 9 mg by mouth daily. 06/22/19  Yes [provider]  loperamide (IMODIUM) 2 MG capsule Take 1 capsule (2 mg total) by mouth 4 (four) times daily as needed for  diarrhea or loose stools. 10/04/19   Volanda Napoleon, PA-C  nitrofurantoin, macrocrystal-monohydrate, (MACROBID) 100 MG capsule Take 1 capsule (100 mg total) by mouth 2 (two) times daily. X 7 days Patient not taking: Reported on 10/04/2019 12/02/17   Molpus, John, MD    Allergies    Penicillins and Sulfa antibiotics  Review of Systems   Review of Systems  Constitutional: Positive for activity change, appetite change, chills, fatigue and fever.  Respiratory: Positive for cough and shortness of breath.   Cardiovascular: Positive for chest pain.  Gastrointestinal: Positive for abdominal pain and nausea. Negative for diarrhea and vomiting.  Genitourinary: Negative for dysuria and hematuria.  Musculoskeletal: Positive for myalgias.  Neurological: Negative for headaches.  All other systems reviewed and are negative.   Physical Exam Updated Vital Signs BP 131/74   Pulse 86   Temp 98.2 F (36.8 C) (Oral)   Resp 19   SpO2 97%   Physical Exam Vitals and nursing note reviewed.  Constitutional:      Appearance: Normal appearance. She is well-developed.     Comments: Sitting comfortably on examination table  HENT:     Head: Normocephalic and atraumatic.  Eyes:     General: Lids are normal.     Conjunctiva/sclera: Conjunctivae normal.     Pupils: Pupils are equal, round, and reactive to light.  Cardiovascular:     Rate and Rhythm: Normal rate and regular rhythm.     Pulses: Normal pulses.     Heart sounds: Normal heart sounds. No murmur. No friction rub. No gallop.   Pulmonary:     Effort: Pulmonary effort is normal.     Breath sounds: Normal breath sounds.     Comments: Lungs clear to auscultation bilaterally.  Symmetric chest rise.  No wheezing, rales, rhonchi. Abdominal:     Palpations: Abdomen is soft. Abdomen is not rigid.     Tenderness: There is generalized abdominal tenderness. There is no guarding.     Comments: Abdomen soft, nondistended.  Generalized tenderness with no  focal point.  No rigidity, guarding.  Musculoskeletal:        General: Normal range of motion.     Cervical back: Full passive range of motion without pain.  Skin:    General: Skin is warm and dry.     Capillary Refill: Capillary refill takes less than 2 seconds.  Neurological:     Mental Status: She is alert and oriented to person, place, and time.  Psychiatric:        Speech: Speech normal.     ED Results / Procedures / Treatments   Labs (all labs ordered are listed, but only abnormal results are displayed) Labs Reviewed  COMPREHENSIVE METABOLIC  PANEL - Abnormal; Notable for the following components:      Result Value   Glucose, Bld 104 (*)    All other components within normal limits  C DIFFICILE QUICK SCREEN W PCR REFLEX  LIPASE, BLOOD  CBC  URINALYSIS, ROUTINE W REFLEX MICROSCOPIC  I-STAT BETA HCG BLOOD, ED ([facility], [facility], [facility] ONLY)  TROPONIN I (HIGH SENSITIVITY)  TROPONIN I (HIGH SENSITIVITY)    EKG None  Radiology DG Chest Portable 1 View  Result Date: 10/04/2019 CLINICAL DATA:  Shortness of breath.  Recent COVID-19 positive EXAM: PORTABLE CHEST 1 VIEW COMPARISON:  November 18, 2015 FINDINGS: There is a focal area of airspace opacity in the right upper lobe. Lungs elsewhere clear. Heart is upper normal in size with pulmonary vascularity normal. No adenopathy. No bone lesions. IMPRESSION: Focal area of apparent pneumonia right upper lobe. Lungs elsewhere clear. Heart upper normal in size. No evident adenopathy. Electronically Signed   By: Bretta Bang III M.D.   On: 10/04/2019 19:37    Procedures Procedures (including critical care time)  Medications Ordered in ED Medications  sodium chloride flush (NS) 0.9 % injection 3 mL (3 mLs Intravenous Given 10/04/19 1908)  ondansetron (ZOFRAN) injection 4 mg (4 mg Intravenous Given 10/04/19 2106)  sodium chloride 0.9 % bolus 1,000 mL (0 mLs Intravenous Stopped 10/04/19 2109)  sodium chloride 0.9 % bolus 500 mL (500 mLs  Intravenous New Bag/Given 10/04/19 2108)  loperamide (IMODIUM) capsule 2 mg (2 mg Oral Given 10/04/19 2108)    ED Course  I have reviewed the triage vital signs and the nursing notes.  Pertinent labs & imaging results that were available during my care of the patient were reviewed by me and considered in my medical decision making (see chart for details).    MDM Rules/Calculators/A&P                      54 year old female who presents for evaluation of nausea, fatigue, diarrhea, shortness of breath, cough.  Recent Covid positive on 09/25/2019.  On initial ED arrival, he is afebrile, nontoxic-appearing.  Benign abdominal exam.  I suspect this is all related to Covid.  Doubt ACS etiology.  History/physical exam not concerning for appendicitis, diverticulitis.  Will plan to check labs, imaging.  I-STAT beta negative.  CBC is unremarkable.  CMP is unremarkable.  Normal BUN and creatinine.  Lipase is unremarkable.  Troponin is unremarkable.  Chest x-ray shows focal area of apparent pneumonia in the right upper lobe.  Lungs otherwise clear.  Discussed results with patient.  She is still having diarrhea here in the emergency department.  She has not recently been on any antibiotics and will plan for C. difficile.  I still think this is likely from her cyst COVID-19 infection.  We will plan to send her home with some Imodium to help with symptoms.  Patient signed out to [place], PA-C.   Portions of this note were generated with Scientist, clinical (histocompatibility and immunogenetics). Dictation errors may occur despite best attempts at proofreading.   Final Clinical Impression(s) / ED Diagnoses Final diagnoses:  COVID-19  Diarrhea, unspecified type  Pneumonia due to COVID-19 virus    Rx / DC Orders ED Discharge Orders         Ordered    loperamide (IMODIUM) 2 MG capsule  4 times daily PRN     10/04/19 2111           Maxwell Caul, PA-C 10/04/19 2115  Maia Plan, MD 10/06/19 1728

## 2019-10-04 NOTE — ED Notes (Signed)
Patient ambulated around the room while oxygen saturation maintained above 96% on room air. States she felt weak but able to ambulate with minimal assistance from staff.

## 2019-10-04 NOTE — ED Provider Notes (Signed)
Care transferred from [facility]. See note for full HPI.  [place] 54 year old presents for evaluation of diarrhea, fatigue.  Diagnosed with Covid on 09/25/2019.  Multiple episodes of liquid stool.  No melena or bright red blood per rectum.  Labs personally reviewed, no significant findings.  Has had some mild possible dysuria.  Plan to follow-up on UA.  FU on UA. Can dc home after UA Physical Exam  BP (!) 129/106 (BP Location: Left Arm)   Pulse 87   Temp 98.6 F (37 C) (Oral)   Resp 18   SpO2 96%   Physical Exam Vitals and nursing note reviewed.  Constitutional:      General: She is not in acute distress.    Appearance: She is well-developed. She is not ill-appearing or toxic-appearing.  HENT:     Head: Normocephalic and atraumatic.     Nose: Nose normal.     Mouth/Throat:     Mouth: Mucous membranes are moist.  Eyes:     Pupils: Pupils are equal, round, and reactive to light.  Cardiovascular:     Rate and Rhythm: Normal rate.     Pulses: Normal pulses.     Heart sounds: Normal heart sounds.  Pulmonary:     Effort: Pulmonary effort is normal. No respiratory distress.     Breath sounds: Normal breath sounds.     Comments: Clear to auscultation bilateral without wheeze, rhonchi or rales. Abdominal:     General: Bowel sounds are normal. There is no distension.     Tenderness: There is no abdominal tenderness. There is no guarding or rebound.     Comments: Soft, nontender without rebound or guarding.  Musculoskeletal:        General: Normal range of motion.     Cervical back: Normal range of motion.     Comments: Moves all 4 extremities without difficulty.  Skin:    General: Skin is warm and dry.     Capillary Refill: Capillary refill takes less than 2 seconds.     Comments: Brisk capillary refill.  No pallor  Neurological:     Mental Status: She is alert.     Comments: Ambulatory in room without difficulty.     ED Course/Procedures     Procedures DG Chest  Portable 1 View  Result Date: 10/04/2019 CLINICAL DATA:  Shortness of breath.  Recent COVID-19 positive EXAM: PORTABLE CHEST 1 VIEW COMPARISON:  November 18, 2015 FINDINGS: There is a focal area of airspace opacity in the right upper lobe. Lungs elsewhere clear. Heart is upper normal in size with pulmonary vascularity normal. No adenopathy. No bone lesions. IMPRESSION: Focal area of apparent pneumonia right upper lobe. Lungs elsewhere clear. Heart upper normal in size. No evident adenopathy. Electronically Signed   By: Bretta Bang III M.D.   On: 10/04/2019 19:37   Labs Reviewed  COMPREHENSIVE METABOLIC PANEL - Abnormal; Notable for the following components:      Result Value   Glucose, Bld 104 (*)    All other components within normal limits  URINALYSIS, ROUTINE W REFLEX MICROSCOPIC - Abnormal; Notable for the following components:   Ketones, ur 20 (*)    Protein, ur 30 (*)    Bacteria, UA RARE (*)    All other components within normal limits  C DIFFICILE QUICK SCREEN W PCR REFLEX  LIPASE, BLOOD  CBC  I-STAT BETA HCG BLOOD, ED ([facility], [facility], [facility] ONLY)  TROPONIN I (HIGH SENSITIVITY)  TROPONIN I (HIGH SENSITIVITY)   MDM  54 year old female presents for evaluation of diarrhea and UR complaints.  Known Covid positive.  Labs and imaging personally reviewed.  No significant abnormality.  Abdomen soft, nonsurgical.  Care transferred pending UA, Plan to Dc home after UA returns.  Urinalysis negative for infection, Covid negative.  Patient ambulatory in ED without any hypoxia, tachycardia or tachypnea.  She speaks in full sentences without difficulty.  Repeat abdominal exam is benign, nonsurgical.  Likely symptoms from her Covid infection.  She is to follow-up with PCP outpatient, return for any new worsening symptoms.  Patient is nontoxic, nonseptic appearing, in no apparent distress.  Patient's pain and other symptoms adequately managed in emergency department.  Fluid bolus given.  Labs, imaging  and vitals reviewed.  Patient does not meet the SIRS or Sepsis criteria.  On repeat exam patient does not have a surgical abdomin and there are no peritoneal signs.  No indication of appendicitis, bowel obstruction, bowel perforation, cholecystitis, diverticulitis.     Kelly Hansen was evaluated in Emergency Department on 10/04/2019 for the symptoms described in the history of present illness. She was evaluated in the context of the global COVID-19 pandemic, which necessitated consideration that the patient might be at risk for infection with the SARS-CoV-2 virus that causes COVID-19. Institutional protocols and algorithms that pertain to the evaluation of patients at risk for COVID-19 are in a state of rapid change based on information released by regulatory bodies including the CDC and federal and state organizations. These policies and algorithms were followed during the patient's care in the ED.  The patient has been appropriately medically screened and/or stabilized in the ED. I have low suspicion for any other emergent medical condition which would require further screening, evaluation or treatment in the ED or require inpatient management.  Patient is hemodynamically stable and in no acute distress.  Patient able to ambulate in department prior to ED.  Evaluation does not show acute pathology that would require ongoing or additional emergent interventions while in the emergency department or further inpatient treatment.  I have discussed the diagnosis with the patient and answered all questions.  Pain is been managed while in the emergency department and patient has no further complaints prior to discharge.  Patient is comfortable with plan discussed in room and is stable for discharge at this time.  I have discussed strict return precautions for returning to the emergency department.  Patient was encouraged to follow-up with PCP/specialist refer to at discharge.   Francoise Chojnowski A,  PA-C 10/04/19 2352    Kelly Leigh, MD 10/05/19 1010

## 2019-10-04 NOTE — ED Triage Notes (Signed)
Pt reports tested Covid+ 1/10 at urgent care in [place]. reports diarrhea for 4 days. Reports taking tylenol for fevers. Having chest pains

## 2019-10-04 NOTE — ED Notes (Addendum)
Patient aware stool sample is needed, unable to provide one at this time.

## 2019-10-05 MED FILL — LOPERAMIDE 2 MG CAPSULE: 2 | 3 days supply | Qty: 12 | Fill #0

## 2019-10-17 MED FILL — GABAPENTIN 300 MG CAPSULE: 300 | 30 days supply | Qty: 90 | Fill #0

## 2019-10-20 DIAGNOSIS — M5416 Radiculopathy, lumbar region: Secondary | ICD-10-CM | POA: Diagnosis not present

## 2019-10-20 DIAGNOSIS — M545 Low back pain: Secondary | ICD-10-CM | POA: Diagnosis not present

## 2019-11-02 DIAGNOSIS — M4726 Other spondylosis with radiculopathy, lumbar region: Secondary | ICD-10-CM | POA: Diagnosis not present

## 2019-11-02 DIAGNOSIS — M5126 Other intervertebral disc displacement, lumbar region: Secondary | ICD-10-CM | POA: Diagnosis not present

## 2019-11-02 DIAGNOSIS — M545 Low back pain: Secondary | ICD-10-CM | POA: Diagnosis not present

## 2019-11-03 DIAGNOSIS — K219 Gastro-esophageal reflux disease without esophagitis: Secondary | ICD-10-CM | POA: Diagnosis not present

## 2019-11-03 DIAGNOSIS — Z8616 Personal history of COVID-19: Secondary | ICD-10-CM | POA: Diagnosis not present

## 2019-11-03 DIAGNOSIS — E039 Hypothyroidism, unspecified: Secondary | ICD-10-CM | POA: Diagnosis not present

## 2019-11-03 DIAGNOSIS — M5440 Lumbago with sciatica, unspecified side: Secondary | ICD-10-CM | POA: Diagnosis not present

## 2019-11-03 DIAGNOSIS — R252 Cramp and spasm: Secondary | ICD-10-CM | POA: Diagnosis not present

## 2019-11-03 DIAGNOSIS — R413 Other amnesia: Secondary | ICD-10-CM | POA: Diagnosis not present

## 2019-11-03 DIAGNOSIS — M159 Polyosteoarthritis, unspecified: Secondary | ICD-10-CM | POA: Diagnosis not present

## 2019-11-03 DIAGNOSIS — J309 Allergic rhinitis, unspecified: Secondary | ICD-10-CM | POA: Diagnosis not present

## 2019-11-03 DIAGNOSIS — E559 Vitamin D deficiency, unspecified: Secondary | ICD-10-CM | POA: Diagnosis not present

## 2019-11-03 MED FILL — traZODone HCL 50 MG TABS: 50 | 30 days supply | Qty: 30 | Fill #0

## 2019-11-03 MED FILL — PRAMIPEXOLE 0.125 MG TABLET: 0.125 | 30 days supply | Qty: 60 | Fill #0

## 2019-11-10 MED FILL — GABAPENTIN 300 MG CAPSULE: 300 | 30 days supply | Qty: 90 | Fill #0

## 2019-11-22 DIAGNOSIS — M545 Low back pain: Secondary | ICD-10-CM | POA: Diagnosis not present

## 2019-11-22 DIAGNOSIS — J019 Acute sinusitis, unspecified: Secondary | ICD-10-CM | POA: Diagnosis not present

## 2019-11-22 DIAGNOSIS — M5416 Radiculopathy, lumbar region: Secondary | ICD-10-CM | POA: Diagnosis not present

## 2019-11-22 MED FILL — AZITHROMYCIN 250 MG TABS: 250 | 5 days supply | Qty: 6 | Fill #0

## 2019-11-22 MED FILL — BACLOFEN 20 MG TABS: 20 | 30 days supply | Qty: 60 | Fill #1

## 2019-11-25 MED FILL — LEVOTHYROXINE SODIUM 50 MCG: 50 | 90 days supply | Qty: 90 | Fill #0

## 2019-11-29 ENCOUNTER — Other Ambulatory Visit (INDEPENDENT_AMBULATORY_CARE_PROVIDER_SITE_OTHER): Payer: 59

## 2019-11-29 ENCOUNTER — Ambulatory Visit (INDEPENDENT_AMBULATORY_CARE_PROVIDER_SITE_OTHER): Payer: 59 | Admitting: Neurology

## 2019-11-29 ENCOUNTER — Encounter: Payer: Self-pay | Admitting: Neurology

## 2019-11-29 ENCOUNTER — Other Ambulatory Visit: Payer: Self-pay

## 2019-11-29 VITALS — BP 116/66 | HR 80 | Ht 62.0 in | Wt 251.6 lb

## 2019-11-29 DIAGNOSIS — R413 Other amnesia: Secondary | ICD-10-CM

## 2019-11-29 LAB — SEDIMENTATION RATE: Sed Rate: 14 mm/h (ref 0–30)

## 2019-11-29 NOTE — Patient Instructions (Signed)
1. Bloodwork for B12, RPR, ammonia, ESR, CRP, ANA, anti-thyroglobulin antibodies, anti-thyroid peroxidase antibodies  2. Schedule MRI brain with and without contrast  3. Schedule Neurocognitive testing  4. Please review your medication list with our office once you get home so we can adjust as necessary  5. Follow-up after tests, call for any changes  FALL PRECAUTIONS: Be cautious when walking. Scan the area for obstacles that may increase the risk of trips and falls. When getting up in the mornings, sit up at the edge of the bed for a few minutes before getting out of bed. Consider elevating the bed at the head end to avoid drop of blood pressure when getting up. Walk always in a well-lit room (use night lights in the walls). Avoid area rugs or power cords from appliances in the middle of the walkways. Use a walker or a cane if necessary and consider physical therapy for balance exercise. Get your eyesight checked regularly.  FINANCIAL OVERSIGHT: Supervision, especially oversight when making financial decisions or transactions is also recommended.  HOME SAFETY: Consider the safety of the kitchen when operating appliances like stoves, microwave oven, and blender. Consider having supervision and share cooking responsibilities until no longer able to participate in those. Accidents with firearms and other hazards in the house should be identified and addressed as well.  DRIVING: Regarding driving, in patients with progressive memory problems, driving will be impaired. We advise to have someone else do the driving if trouble finding directions or if minor accidents are reported. Independent driving assessment is available to determine safety of driving.  ABILITY TO BE LEFT ALONE: If patient is unable to contact 911 operator, consider using LifeLine, or when the need is there, arrange for someone to stay with patients. Smoking is a fire hazard, consider supervision or cessation. Risk of wandering  should be assessed by caregiver and if detected at any point, supervision and safe proof recommendations should be instituted.  MEDICATION SUPERVISION: Inability to self-administer medication needs to be constantly addressed. Implement a mechanism to ensure safe administration of the medications.  RECOMMENDATIONS FOR ALL PATIENTS WITH MEMORY PROBLEMS: 1. Continue to exercise (Recommend 30 minutes of walking everyday, or 3 hours every week) 2. Increase social interactions - continue going to [place] and enjoy social gatherings with friends and family 3. Eat healthy, avoid fried foods and eat more fruits and vegetables 4. Maintain adequate blood pressure, blood sugar, and blood cholesterol level. Reducing the risk of stroke and cardiovascular disease also helps promoting better memory. 5. Avoid stressful situations. Live a simple life and avoid aggravations. Organize your time and prepare for the next day in anticipation. 6. Sleep well, avoid any interruptions of sleep and avoid any distractions in the bedroom that may interfere with adequate sleep quality 7. Avoid sugar, avoid sweets as there is a strong link between excessive sugar intake, diabetes, and cognitive impairment The Mediterranean diet has been shown to help patients reduce the risk of progressive memory disorders and reduces cardiovascular risk. This includes eating fish, eat fruits and green leafy vegetables, nuts like almonds and hazelnuts, walnuts, and also use olive oil. Avoid fast foods and fried foods as much as possible. Avoid sweets and sugar as sugar use has been linked to worsening of memory function.  There is always a concern of gradual progression of memory problems. If this is the case, then we may need to adjust level of care according to patient needs. Support, both to the patient and caregiver, should then be  put into place.

## 2019-11-29 NOTE — Progress Notes (Signed)
NEUROLOGY CONSULTATION NOTE  Kelly Hansen MRN: [id number] DOB: 11/01/1965  Referring provider: Dr. Nicholos Johns Primary care provider: Dr. Nicholos Johns  Reason for consult:  Memory loss  Dear Dr Rica Records:  Thank you for your kind referral of Kelly Hansen for consultation of the above symptoms. Although her history is well known to you, please allow me to reiterate it for the purpose of our medical record. Her daughter Kelly Hansen was on speakerphone to provide collateral information. Records and images were personally reviewed where available.   HISTORY OF PRESENT ILLNESS: This is a pleasant 54 year old right-handed woman with a history of hyperlipidemia, hypothyroidism, fibromyalgia, chronic back pain, presenting for evaluation of memory loss. She feels her symptoms started a year ago. Her daughter Kelly Hansen started noticing changes a few years ago. 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 One day she ordered something wrong, she said she did not do it, but looking at the paperwork, she actually did but did not remember it. She was laid off 2-3 years ago when they had a company-wide lay off. Kelly Hansen moved in to live with her and her husband last July 2020, and found out that she had missed 3 months of utility bills, owing $800. She thought she was paying the bills. Kelly Hansen also saw that she was not taking her medications as instructed, taking medications only at bedtime, when some were TID dosing. Kelly Hansen has been managing bills and medications since then. She continues to drive and denies getting lost, however Kelly Hansen reports that there have been 3 instances since December where she would be driving and Kelly Hansen is in the car, she went past her sister's house that she had visited for many years and got confused about where she was and had to stop. She forgets  days/the year more frequently. She goes to a store and does not remember what she came for. She is independent with dressing and bathing. Kelly Hansen has also noticed mood changes, "a different kind of cranky." Kelly Hansen does not think she is cranky, but Kelly Hansen states on bad days where she has trouble focusing, she gets "super cranky." No paranoia or hallucinations. She feels her mood is okay. She has a strong family history of dementia, her mother had memory changes per Kelly Hansen in her late 60s with several strokes, Wadie states it was in her mid-50s. Her maternal aunt and uncle also had memory issues in their late 54s. Her sister also has the same problems that started in her late 93s (now 55yo). She denies any history of significant head injuries or alcohol use.  For the past 3-4 days, she has had a lot of headaches, no prior history of headaches. She has fibromyalgia and chronic back pain, gabapentin 370m TID helps with the fibromyalgia but not much with back pain. She was having sleep difficulties, sometimes up for 3 nights, until she was started on Trazodone 549m1/2 tab qhs 2 weeks ago, now sleeping better. She has occasional dizziness and has prn meclizine that she has not taken in a while. When sitting still in a car, she feels like the car is still rolling sometimes. She denies any diplopia, dysarthria/dysphagia, focal numbness/tingling/weakness, anosmia, or tremors. She had a bout of colitis and has occasional diarrhea depending on her diet. She is on Mirapex which helps with muscle  cramps. She is on Cymbalta for depression. Kelly Hansen is unsure of Donepezil and Memantine dosing, thinks she is taking Memantine once a day.   Laboratory Data: TSH 5.310  PAST MEDICAL HISTORY: Past Medical History:  Diagnosis Date  . Acid reflux   . Depression     PAST SURGICAL HISTORY: Past Surgical History:  Procedure Laterality Date  . ABDOMINAL HYSTERECTOMY    . APPENDECTOMY      MEDICATIONS: Current Outpatient  Medications on File Prior to Visit  Medication Sig Dispense Refill  . baclofen (LIORESAL) 20 MG tablet Take 20 mg by mouth 2 (two) times daily as needed for mild pain.    Marland Kitchen diclofenac (VOLTAREN) 75 MG EC tablet Take 75 mg by mouth 2 (two) times daily.    Marland Kitchen donepezil (ARICEPT) 10 MG tablet Take 10 mg by mouth daily.    . DULoxetine (CYMBALTA) 60 MG capsule Take 60 mg by mouth daily.    . ergocalciferol (VITAMIN D2) 1.25 MG (50000 UT) capsule Take 50,000 Units by mouth once a week. Thursday    . fluticasone (FLOVENT DISKUS) 50 MCG/BLIST diskus inhaler Inhale 1 puff into the lungs 2 (two) times daily.    . furosemide (LASIX) 20 MG tablet Take 20 mg by mouth daily.    Marland Kitchen gabapentin (NEURONTIN) 300 MG capsule Take 300 mg by mouth 3 (three) times daily.    Marland Kitchen levothyroxine (SYNTHROID) 25 MCG tablet Take 50 mcg by mouth daily.    Marland Kitchen loperamide (IMODIUM) 2 MG capsule Take 1 capsule (2 mg total) by mouth 4 (four) times daily as needed for diarrhea or loose stools. 12 capsule 0  . Meclizine HCl 25 MG CHEW Chew 25 mg by mouth 3 (three) times daily as needed for dizziness.    . meloxicam (MOBIC) 15 MG tablet Take 15 mg by mouth daily.    . montelukast (SINGULAIR) 10 MG tablet Take 10 mg by mouth at bedtime.    . pantoprazole (PROTONIX) 40 MG tablet Take 40 mg by mouth daily.    . pramipexole (MIRAPEX) 0.125 MG tablet Take 0.125 mg by mouth 2 (two) times daily.    . rosuvastatin (CRESTOR) 10 MG tablet Take 10 mg by mouth daily.    Marland Kitchen UCERIS 9 MG TB24 Take 9 mg by mouth daily.     No current facility-administered medications on file prior to visit.    ALLERGIES: Allergies  Allergen Reactions  . Penicillins   . Sulfa Antibiotics     FAMILY HISTORY: Family History  Problem Relation Age of Onset  . Dementia Mother   . Dementia Sister   . Dementia Maternal Aunt   . Dementia Maternal Uncle     SOCIAL HISTORY: Social History   Socioeconomic History  . Marital status: Married    Spouse name: Not  on file  . Number of children: Not on file  . Years of education: Not on file  . Highest education level: Not on file  Occupational History  . Not on file  Tobacco Use  . Smoking status: Former Smoker    Quit date: 03/10/2011    Years since quitting: 8.7  . Smokeless tobacco: Never Used  Substance and Sexual Activity  . Alcohol use: No  . Drug use: No  . Sexual activity: Not on file  Other Topics Concern  . Not on file  Social History Narrative   Right handed   Lives with family    One story home few steps to get into home  Social Determinants of Health   Financial Resource Strain:   . Difficulty of Paying Living Expenses:   Food Insecurity:   . Worried About Charity fundraiser in the Last Year:   . Arboriculturist in the Last Year:   Transportation Needs:   . Film/video editor (Medical):   Marland Kitchen Lack of Transportation (Non-Medical):   Physical Activity:   . Days of Exercise per Week:   . Minutes of Exercise per Session:   Stress:   . Feeling of Stress :   Social Connections:   . Frequency of Communication with Friends and Family:   . Frequency of Social Gatherings with Friends and Family:   . Attends Religious Services:   . Active Member of Clubs or Organizations:   . Attends Archivist Meetings:   Marland Kitchen Marital Status:   Intimate Partner Violence:   . Fear of Current or Ex-Partner:   . Emotionally Abused:   Marland Kitchen Physically Abused:   . Sexually Abused:     REVIEW OF SYSTEMS: Constitutional: No fevers, chills, or sweats, no generalized fatigue, change in appetite Eyes: No visual changes, double vision, eye pain Ear, nose and throat: No hearing loss, ear pain, nasal congestion, sore throat Cardiovascular: No chest pain, palpitations Respiratory:  No shortness of breath at rest or with exertion, wheezes GastrointestinaI: No nausea, vomiting, diarrhea, abdominal pain, fecal incontinence Genitourinary:  No dysuria, urinary retention or  frequency Musculoskeletal:  + neck pain, back pain Integumentary: No rash, pruritus, skin lesions Neurological: as above Psychiatric: No depression, insomnia, anxiety Endocrine: No palpitations, fatigue, diaphoresis, mood swings, change in appetite, change in weight, increased thirst Hematologic/Lymphatic:  No anemia, purpura, petechiae. Allergic/Immunologic: no itchy/runny eyes, nasal congestion, recent allergic reactions, rashes  PHYSICAL EXAM: Vitals:   11/29/19 0857  BP: 116/66  Pulse: 80  SpO2: 96%   General: No acute distress Head:  Normocephalic/atraumatic Skin/Extremities: No rash, no edema Neurological Exam: Mental status: alert and oriented to person, place, and time, no dysarthria or aphasia, Fund of knowledge is appropriate.  Recent and remote memory are impaired.  Attention and concentration are reduced. SLUMS score 13/30 [employer] Mental Exam 11/29/2019  Weekday Correct 1  Current year 1  What state are we in? 1  Amount spent 1  Amount left 0  # of Animals 2  5 objects recall 3  Number series 0  Hour markers 0  Time correct 0  Placed X in triangle correctly 1  Largest Figure 1  Name of female 2  Date back to work 0  Type of work 0  State she lived in 0  Total score 13    Cranial nerves: CN I: not tested CN II: pupils equal, round and reactive to light, visual fields intact CN III, IV, VI:  full range of motion, no nystagmus, no ptosis CN V: facial sensation intact CN VII: upper and lower face symmetric CN VIII: hearing intact to conversation Bulk & Tone: normal, no fasciculations, no cogwheeling Motor: 5/5 throughout with no pronator drift. Sensation: intact to light touch, cold, pin, vibration and joint position sense.  No extinction to double simultaneous stimulation.  Romberg test negative Deep Tendon Reflexes: +2 throughout, no ankle clonus Cerebellar: no incoordination on finger to nose testing Gait: narrow-based and steady, difficulty  with tandem walk Tremor: none  IMPRESSION: This is a pleasant 54 year old right-handed woman with a history of hyperlipidemia, hypothyroidism, fibromyalgia, chronic back pain, presenting for evaluation of memory loss.  Her neurological exam is non-focal, SLUMS score today 13/30. She has had memory changes for the past 3 years, with difficulty with complex tasks. There is a strong family history of possibly early onset dementia, symptoms concerning for early onset Alzheimer's disease. MRI brain with and without contrast will be ordered to assess for underlying structural abnormality and assess vascular load. Bloodwork for B12, RPR, ammonia, ESR, CRP, ANA, anti-thyroglobulin antibodies, anti-thyroid peroxidase antibodies will be done. She will be scheduled for Neurocognitive testing to further evaluate memory concerns. We may consider lumbar puncture depending on Neurocognitive testing results. She is on Donepezil and Memantine, however Kelly Hansen is unsure of dosing and will contact our office to confirm. We discussed expectations from these medications. We discussed the importance of control of vascular risk factors, physical exercise, and brain stimulation exercises for brain health. Follow-up after tests, they know to call for any changes.   Thank you for allowing me to participate in the care of this patient. Please do not hesitate to call for any questions or concerns.   Ellouise Newer, M.D.  CC: Dr. Rica Records

## 2019-11-30 LAB — THYROGLOBULIN LEVEL: Thyroglobulin: 0.8 ng/mL — ABNORMAL LOW

## 2019-11-30 LAB — AMMONIA: Ammonia: 39 umol/L (ref <=72)

## 2019-11-30 LAB — ANA: Anti Nuclear Antibody (ANA): NEGATIVE

## 2019-11-30 LAB — VITAMIN B12: Vitamin B-12: 236 pg/mL (ref 200–1100)

## 2019-11-30 LAB — THYROID PEROXIDASE ANTIBODY: Thyroperoxidase Ab SerPl-aCnc: 66 IU/mL — ABNORMAL HIGH (ref <9)

## 2019-11-30 LAB — RPR: RPR Ser Ql: NONREACTIVE

## 2019-11-30 MED FILL — PRAMIPEXOLE 0.125 MG TABLET: 0.125 | 30 days supply | Qty: 60 | Fill #1

## 2019-12-01 ENCOUNTER — Encounter: Payer: Self-pay | Admitting: Counselor

## 2019-12-01 ENCOUNTER — Telehealth: Payer: Self-pay | Admitting: Neurology

## 2019-12-01 ENCOUNTER — Ambulatory Visit: Payer: 59

## 2019-12-01 ENCOUNTER — Other Ambulatory Visit: Payer: Self-pay

## 2019-12-01 ENCOUNTER — Ambulatory Visit (INDEPENDENT_AMBULATORY_CARE_PROVIDER_SITE_OTHER): Payer: 59 | Admitting: Counselor

## 2019-12-01 DIAGNOSIS — R413 Other amnesia: Secondary | ICD-10-CM

## 2019-12-01 DIAGNOSIS — F02818 Dementia in other diseases classified elsewhere, unspecified severity, with other behavioral disturbance: Secondary | ICD-10-CM

## 2019-12-01 DIAGNOSIS — G300 Alzheimer's disease with early onset: Secondary | ICD-10-CM | POA: Diagnosis not present

## 2019-12-01 DIAGNOSIS — Z639 Problem related to primary support group, unspecified: Secondary | ICD-10-CM

## 2019-12-01 DIAGNOSIS — F0281 Dementia in other diseases classified elsewhere with behavioral disturbance: Secondary | ICD-10-CM

## 2019-12-01 NOTE — Progress Notes (Signed)
   Psychometrist Note   Cognitive testing was administered to Kelly Hansen by Lamar Benes, B.S. (Technician) under the supervision of Alphonzo Severance, Psy.D., ABN. Kelly Hansen was able to tolerate all test procedures. Dr. Nicole Kindred met with the patient as needed to manage any emotional reactions to the testing procedures (if applicable). Rest breaks were offered.    The battery of tests administered was selected by Dr. Nicole Kindred with consideration to the patient's current level of functioning, the nature of her symptoms, emotional and behavioral responses during the interview, level of literacy, observed level of motivation/effort, and the nature of the referral question. This battery was communicated to the psychometrist. Communication between Dr. Nicole Kindred and the psychometrist was ongoing throughout the evaluation and Dr. Nicole Kindred was immediately accessible at all times. Dr. Nicole Kindred provided supervision to the technician on the date of this service, to the extent necessary to assure the quality of all services provided.    Kelly Hansen will return in approximately one week for an interactive feedback session with Dr. Nicole Kindred, at which time female test performance, clinical impressions, and treatment recommendations will be reviewed in detail. The patient understands she can contact our office should she require our assistance before this time.   A total of 100 minutes of billable time were spent with Kelly Hansen by the technician, including test administration and scoring time. Billing for these services is reflected in Dr. Les Pou note.   This note reflects time spent with the psychometrician and does not include test scores, clinical history, or any interpretations made by Dr. Nicole Kindred. The full report will follow in a separate note.

## 2019-12-01 NOTE — Telephone Encounter (Signed)
Pt called no answer voice mail left for her to call back   
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
## 2019-12-01 NOTE — Telephone Encounter (Signed)
Left message with the after hour service   Pt was told that she needs a MRI and that she would need to schedule that her self please call her she would like to speak to someone

## 2019-12-02 NOTE — Telephone Encounter (Signed)
[facility] called Pt yesterday and got her scheduled for her MRI

## 2019-12-02 NOTE — Progress Notes (Signed)
[facility]  Patient Name: Kelly Hansen MRN: [id number] Date of Birth: 06-19-1966 Age: 54 y.o. Education: 10 years  Measurement properties of test scores: IQ, Index, and Standard Scores (SS): Mean = 100; Standard Deviation = 15 Scaled Scores (Ss): Mean = 10; Standard Deviation = 3 Z scores (Z): Mean = 0; Standard Deviation = 1 T scores (T); Mean = 50; Standard Deviation = 10  TEST SCORES:    Note: This summary of test scores accompanies the interpretive report and should not be considered in isolation without reference to the appropriate sections in the text. Test scores are relative to age, gender, and educational history as available and appropriate.   Performance Validity        A Random Letter Test Raw  Descriptor      Errors 0 errors Within Expectation  The Dot Counting Test: 18 Within Expectation      Mental Status Screening     Total Score Descriptor  MMSE 25 (World) Mild Dementia   23 (Serial 7's) Mild Dementia  Expected Functioning        Wide Range Achievement Test: Standard/Scaled Score Percentile      Word Reading 76 5      Reynolds Intellectual Screening Test Standard/T-score Percentile      Guess What 33 5      Odd Item Out 43 25  RIST Index 84 15      Attention/Processing Speed        Neuropsychological Assessment Battery (Attention Module, Form 1): T-score Percentile      Digits Forward 37 9      Digits Backwards 34 5      Repeatable Battery for the Assessment of Neuropsychological Status (Form A): Scaled Score Percentile      Coding 3 1      Language        Neuropsychological Assessment Battery (Language Module, Form 1): T-score Percentile      Naming   (26) 32 4      Verbal Fluency: T-score Percentile      Controlled Oral Word Association (F-A-S) 35 7      Semantic Fluency (Animals) 33 5      Memory:        Neuropsychological Assessment Battery (Memory Module, Form 1): T-score Percentile   List Learning           List A Immediate Recall   (3, 4, 5) 30 2         List B Immediate Recall   (3) 43 25         List A Short Delayed Recall   (0) 21 <1         List A Long Delayed Recall   (0) 27 1         List A Long Delayed Yes/No Recognition Hits   (10) -- 27         List A Long Delayed Yes/No Recognition False Alarms   (13) -- <1         List A Recognition Discriminability Index -- <1     Story Learning           Immediate Recall   (10, 18) 28 2         Delayed Recall   (8) 31 3      Daily Living Memory            Immediate Recall   (14, 9) 28 2  Delayed Recall   (2, 0) 19 <1          Recognition Hits -- <1      Repeatable Battery for the Assessment of Neuropsychological Status (Form A): Scaled Score Percentile         Figure Recall   (10) 7 16      Visuospatial/Constructional Functioning        Repeatable Battery for the Assessment of Neuropsychological Status (Form A): Standard/Scaled Score Percentile     Visuospatial/Constructional Index 84 14      Figure Copy   (18) 10 50         Judgment of Line Orientation --- 3-9      Executive Functioning        Modified [employer] Test (MWCST): Standard/T-Score Percentile      Number of Categories Correct 29 2      Number of Perseverative Errors 50 50      Number of Total Errors 42 22      Percent Perseverative Errors 55 70  Executive Function Composite 83 13      Trail Making Test: T-Score Percentile      Part A 24 <1      Part B 31 3      [place] Diagnostic Aphasia Exam: Raw Score Scaled Score      Complex Ideational Material 6 2      Clock Drawing Raw Score Descriptor      Command 7 Mild Impairment      Rating Scales        Quick Dementia Rating System Raw Score Descriptor      Sum of Boxes 3.5 Very Mild Dementia      Total Score 6.5 Mild Dementia  Geriatric Depression Scale - Short Form 6 Positive   Peter V. Roseanne Reno PsyD, ABN Clinical Neuropsychologist

## 2019-12-05 ENCOUNTER — Ambulatory Visit
Admission: RE | Admit: 2019-12-05 | Discharge: 2019-12-05 | Disposition: A | Discharge status: Home / Self Care | Payer: 59 | Source: Ambulatory Visit | Attending: Neurology | Admitting: Neurology

## 2019-12-05 ENCOUNTER — Other Ambulatory Visit: Payer: Self-pay

## 2019-12-05 DIAGNOSIS — R413 Other amnesia: Secondary | ICD-10-CM

## 2019-12-05 MED ORDER — GADOBENATE DIMEGLUMINE 529 MG/ML IV SOLN
20.0000 mL | Freq: Once | INTRAVENOUS | Status: AC | PRN
  Administered 2019-12-05: 20 mL via INTRAVENOUS

## 2019-12-06 MED FILL — DULOXETINE HCL 60 MG CPEP: 60 | 90 days supply | Qty: 90 | Fill #0

## 2019-12-06 MED FILL — DICLOFENAC SODIUM 75 MG TAB: 75 | 30 days supply | Qty: 30 | Fill #0

## 2019-12-06 MED FILL — GABAPENTIN 300 MG CAPSULE: 300 | 90 days supply | Qty: 270 | Fill #0

## 2019-12-06 NOTE — Progress Notes (Signed)
NEUROPSYCHOLOGICAL EVALUATION [facility]  Patient Name: Kelly Hansen MRN: [id number] Date of Birth: 06/07/1966 Age: 54 y.o. Education: 10 years  Clinical Impressions  Kelly Hansen is a 54 y.o., right-hand dominant, married woman with a history of memory problems over the past 2 or 3 years according to her family. There is significant family conflict because the patient does not think anything is wrong and gets defensive. Admittedly, her family can be accusatory and do not understand that her memory problems are not her fault. They reported mainly problems with memory, with orientation to time, there have been some slight changes with driving, and she also has some impulsiveness and dietary changes (was having cravings for chocolate ice cream and eating it every day causing weight gain). She has no recent neuroimaging but does have a CT head from 2017 that shows reasonable brain volume and morphology for her age and no significant areas of hypodensity concerning for leukoaraiosis.   On neuropsychological testing there is suggestion of changes including memory storage problems and executive difficulties. There may be some additional mild visuospatial problems but there is no very significant visuospatial impairment. Her visual object confrontation naming was low although she mainly missed low-frequency use words and in the context of her premorbid ability and 10th grade education, this may not be a change for her. Performance on measures of attention and working memory was weak. Speed of processing and verbal fluency measures were unusually to extremely low. Her daughter characterized her as functioning at a mild dementia level, which seems reasonable, and I do not think she will be able to return to work. She screened positive for depression.   [name] is thus manifesting memory impairment and diminished executive abilities amounting to a mild dementia level of function.  Presentation is most concerning for a typical presentation of Alzheimer's disease, with early onset. There may be a more minor vascular contributor and her MRI will be helpful.   Diagnostic Impressions: Early onset Alzheimer's disease with behavioral disturbance Adjustment disorder with mixed anxiety and depressed mood.   Recommendations to be discussed with patient  Your performance and presentation on assessment today were consistent with an impaired level of performance on measures of memory and on some tests of "executive function," which is a multifaceted cognitive ability that can be understood as a supervisory and control system for other lower-order abilities. Combined with your clinical history, I think the data warrant a diagnosis of dementia, that is memory and thinking problems that are significant enough to interfere with your day-to-day function.   Dementia refers to a group of syndromes where multiple areas of ability are damaged in the brain, such as memory, thinking, judgment, and behavior, and most commonly refer to age related causes of dementia that cause worsening in these abilities over time. Alzheimer's disease is the most common form of dementia in people over the age of 40. Not all dementias are Alzheimer's disease, but all Alzheimer's disease is dementia. When dementia is due to an underlying condition affecting the brain, such as Alzheimer's disease, there is progression over time, which typically procedes gradually over many years.   In your case, I concur with Dr. Karel Jarvis that Alzheimer's disease is the most likely cause of your problems. Alzheimer's disease is typically associated with late-onset dementia but it still the most common form of dementia in younger individuals. Often times, earlier presentations are associated with different clinical features, although in your case, the condition looks very much like what  we would expect in a typical case of dementia that is late  onset. That is, your main difficulties appear to be with memory, orientation, and to some extent executive function.   Test findings and diagnostic impressions are viewed as supportive of disability status; I do not think you will be able to return to work.   We will discuss the fact that your memory errors, while aggravating to your family, are not your fault and are not something you can control. They are part of the disease process itself. This calls for tact when dealing with memory problems and increased patience on the part of your family members.   Often times, individuals with dementia are the last to be aware of their problems. The medical term for this lack of insight is called "anosognosia," which means absence of knowledge of illness. While their failure to recognize what seems obvious to everyone else can be frustrating, it is not their fault and is part of the disease itself. Trying to point out problems to the unaware individual is usually not helpful and is frequently harmful. What is helpful is to provide reassurance, redirect them, and avoid confronting them directly with the cognitive difficulties they are experiencing.  It is particularly important for individuals with anosognosia to get help planning for medical and other needs because they often are unaware of what their needs are.   It is unhelpful to argue with the demented individual, accuse them, ask them to do a task that is too hard for them to accomplish, or expect their previous level of performance. This is a frequent cause of catastrophic reactions and agitation, which is what I think is going on in your family. We will discuss more helpful ways to interact and deal with behavior problems. Some general recommendations are below:   Provide assistance as needed with memory-dependent or other cognitively challenging activities (e.g., doctors appointments, medications, planning trips, complex chores, paperwork or other forms).     Use simple, clear words that are easy to understand. Simplify information, be clear and concise.   Provide reassurance rather than trying to reason or give explanations.   Suggest the word that the person may be looking for.   Help simplify steps of activities in daily life, such as writing down steps of a task. Eliminate tasks that are difficult and frustrating.   Give instructions one step at a time.   Consistency, praise, and encouragement are very important; do not use criticism or punishment.   Agitated or catastrophic reactions are often a reaction to something in the environment or task difficulty/failure; know functional limits; when noticing the person getting upset, help redirect them away from the task and/or move to a different environment.    Genetic testing/counseling should be considered given strong family history of dementia.    Test Findings  Test scores are summarized in additional documentation associated with this encounter. Test scores are relative to age, gender, and educational history as available and appropriate. There were no concerns about performance validity as all findings fell within normal expectations.   General Intellectual Functioning/Achievement:  Performance on indicators of premorbid ability tempered expectations for the patient's test data. Single word reading was unusually low, equivalent to a 4.6th grade level. Performance on the [employer] was low average, with unusually low performance on the more verbally oriented subtest and low average performance on the more visually oriented subtest.   Attention and Processing Efficiency: Indicators of attention and processing efficiency was  low, with low average digit repetition forward and unusually low digit repetition backward. Performance on mental math calculation was within normal limits for addition and division but she scored at an unusually low level for mental subtraction  and mental multiplication.   Timed indicators of processing efficiency generated low scores with an extremely low score on timed number-symbol coding and on simple numeric sequencing, the latter of which is a very easy test.   Language: Performance on visual object confrontation naming was low, although the patient mainly missed low frequency items and I am not sure this represents a change given her premorbid status. Generation of words was unusually low both in response to category and phonemic prompts.   Visuospatial Function: Performance on indicators of visuospatial and constructional functioning was low average overall. Figure copy was average but she did have an unusually low score on judgment of angular line orientations.   Learning and Memory: Performance on memory measures was suggestive of memory storage problems with weak encoding of information and very minimal retention across time. She did do a bit better when learning and recalling visual as compared to verbal information, likely on the basis of preexisting strings and weaknesses.   In the verbal realm, immediate recall for a 12-item word list was 3, 4, and 5 words across three trials,generating an unusually low score. She did not recall any of these words following short or long delayed intervals, resulting in extremely low scores. Yes/no recognition discriminability of the words from the list versus false choices was notable for more false positives than correct identifications. Immediate recall for a short story and brief daily living-type information was extremely low with unusually low delayed recall of the short story and extremely low delayed recall for the daily living information. Recognition of the daily living items contained amongst distractor alternate items was also extremely low.   Delayed recall for visual information, a modestly complex geometric figure, was low average.   Executive Functions: Performance on  indicators of executive functioning was mixed with an overall impression of some mild decline. The Modified [employer] test generated a low average Executive Function Composite score with an extremely low score for solution categories but an average score for perseverative errors. This means that she was not able to effectively solve the task but she was able to shift her problem solving approach on the basis of ongoing feedback. Alternating sequencing of numbers and letters of the alphabet was unusually low. Performance when answering a series of yes/no questions on a measure requiring her to attend to and then reason with verbally presented information was extremely low. Clock drawing was consistent with "Mild Impairment" with missing numbers, errors in spatial layout of numbers, and hands incorrectly set at 11:15.   Rating Scale(s): Ms. Dauenhauer screened positive for the presence of depression, which in large part is likely related to the level of psychosocial conflict between her and her family. She was characterized as functioning at a very mild to mild dementia range by her daughter.   Bettye Boeck Roseanne Reno PsyD, ABN Clinical Neuropsychologist

## 2019-12-08 ENCOUNTER — Other Ambulatory Visit: Payer: Self-pay

## 2019-12-08 ENCOUNTER — Ambulatory Visit (INDEPENDENT_AMBULATORY_CARE_PROVIDER_SITE_OTHER): Payer: 59 | Admitting: Counselor

## 2019-12-08 ENCOUNTER — Encounter: Payer: Self-pay | Admitting: Counselor

## 2019-12-08 DIAGNOSIS — G300 Alzheimer's disease with early onset: Secondary | ICD-10-CM

## 2019-12-08 DIAGNOSIS — F02818 Dementia in other diseases classified elsewhere, unspecified severity, with other behavioral disturbance: Secondary | ICD-10-CM

## 2019-12-08 DIAGNOSIS — F0281 Dementia in other diseases classified elsewhere with behavioral disturbance: Secondary | ICD-10-CM | POA: Diagnosis not present

## 2019-12-08 NOTE — Progress Notes (Signed)
[facility]  I met with Kelly Hansen to review the findings resulting from her neuropsychological evaluation. She was attended by her daughter, Kelly Hansen. Since the last appointment, things have been about the same although she was feeling sad yesterday and did cry a bit. She wasn't sure why she feels sad and couldn't provide much insight into her subjective experience. She also stated that she felt like "my brain was disconnected from my body," this was a feeling and wasn't associated with any automatisms, incontinence, tongue biting, or visual illusions/hallcuinations. She feels more like herself today. Time was spent reviewing the impressions and recommendations that are detailed in the evaluation report. We had a long discussion that the best diagnosis at this point in time is early onset Alzheimer's dementia. Her daughter said she continues to have problems driving and she is concerned. Neuropsychological testing is only so predictive of driving ability, but based on the family's report I recommended that she not drive at this time. I also suggested that they follow up with the patient's PCP if they feel it rises to the level that she needs to be referred for drivers evaluation. I will forward her a copy of this note at the patient's request. Interventions provided during this encounter included psychoeducation, care coordination, and other topics as reflected in the patient instructions. I took time to explain the findings and answer all the patient's questions. I encouraged Kelly Hansen to contact me should she have any further questions or if further follow up is desired.   Current Medications and Medical History   Current Outpatient Medications  Medication Sig Dispense Refill  . baclofen (LIORESAL) 20 MG tablet Take 20 mg by mouth 2 (two) times daily as needed for mild pain.    Marland Kitchen diclofenac (VOLTAREN) 75 MG EC tablet Take 75 mg by mouth 2 (two) times daily.     Marland Kitchen donepezil (ARICEPT) 10 MG tablet Take 10 mg by mouth daily.    . DULoxetine (CYMBALTA) 60 MG capsule Take 60 mg by mouth daily.    . ergocalciferol (VITAMIN D2) 1.25 MG (50000 UT) capsule Take 50,000 Units by mouth once a week. Thursday    . fluticasone (FLOVENT DISKUS) 50 MCG/BLIST diskus inhaler Inhale 1 puff into the lungs 2 (two) times daily.    . furosemide (LASIX) 20 MG tablet Take 20 mg by mouth daily.    Marland Kitchen gabapentin (NEURONTIN) 300 MG capsule Take 300 mg by mouth 3 (three) times daily.    Marland Kitchen levothyroxine (SYNTHROID) 25 MCG tablet Take 50 mcg by mouth daily.    Marland Kitchen loperamide (IMODIUM) 2 MG capsule Take 1 capsule (2 mg total) by mouth 4 (four) times daily as needed for diarrhea or loose stools. 12 capsule 0  . Meclizine HCl 25 MG CHEW Chew 25 mg by mouth 3 (three) times daily as needed for dizziness.    . meloxicam (MOBIC) 15 MG tablet Take 15 mg by mouth daily.    . memantine (NAMENDA) 10 MG tablet Take 10 mg by mouth daily. Taking it once a day    . montelukast (SINGULAIR) 10 MG tablet Take 10 mg by mouth at bedtime.    . pantoprazole (PROTONIX) 40 MG tablet Take 40 mg by mouth daily.    . pramipexole (MIRAPEX) 0.125 MG tablet Take 0.125 mg by mouth 2 (two) times daily.    . rosuvastatin (CRESTOR) 10 MG tablet Take 10 mg by mouth daily.    . traZODone (DESYREL) 50  MG tablet Take 50 mg by mouth at bedtime. Take 1/2 tablet every night    . UCERIS 9 MG TB24 Take 9 mg by mouth daily.     No current facility-administered medications for this visit.    Patient Active Problem List   Diagnosis Date Noted  . CIN III with severe dysplasia 10/06/2016    Mental Status and Behavioral Observations  Ms. Rieves was available at the scheduled time for her appointment. She was alert although orientation was not formally assessed and is thus unclear. Her speech was normal in rate, rhythm, volume, and prosody. Her mood was "I was sad yesterday, I feel better today." Affect was mainly neutral.  Her thought process continues to be circumstantial bordering on tangential. Content was appropriate. There were no safety concerns identified, such as thoughts of harming self or others.   Plan  Feedback provided regarding the patient's neuropsychological evaluation. Based on her family's report of difficulties, I recommend that she does not drive at this time although I also would defer to the patient's PCP who is more familiar with her functioning. I also raised the issue of genetic testing, which may be helpful for her daughter given strong family history of dementia, she wanted to think about it. Kelly Hansen was encouraged to contact me if any questions arise or if further follow up is desired.   Viviano Simas Nicole Kindred, PsyD, ABN Clinical Neuropsychologist  Service(s) Provided at This Encounter: 50 minutes [phone]; Conjoint therapy with patient present)

## 2019-12-08 NOTE — Patient Instructions (Signed)
Your performance and presentation on assessment today were consistent with an impaired level of performance on measures of memory and on some tests of "executive function," which is a multifaceted cognitive ability that can be understood as a supervisory and control system for other lower-order abilities. Combined with your clinical history, I think the data warrant a diagnosis of dementia, that is memory and thinking problems that are significant enough to interfere with your day-to-day function.   Dementia refers to a group of syndromes where multiple areas of ability are damaged in the brain, such as memory, thinking, judgment, and behavior, and most commonly refer to age related causes of dementia that cause worsening in these abilities over time. Alzheimer's disease is the most common form of dementia in people over the age of 36. Not all dementias are Alzheimer's disease, but all Alzheimer's disease is dementia. When dementia is due to an underlying condition affecting the brain, such as Alzheimer's disease, there is progression over time, which typically procedes gradually over many years.   In your case, I concur with Dr. Karel Jarvis that Alzheimer's disease is the most likely cause of your problems. Alzheimer's disease is typically associated with late-onset dementia but it still the most common form of dementia in younger individuals. Often times, earlier presentations are associated with different clinical features, although in your case, the condition looks very much like what we would expect in a typical case of dementia that is late onset. That is, your main difficulties appear to be with memory, orientation, and to some extent executive function.   Test findings and diagnostic impressions are viewed as supportive of disability status; I do not think you will be able to return to work.   We will discuss the fact that your memory errors, while aggravating to your family, are not your fault and  are not something you can control. They are part of the disease process itself. This calls for tact when dealing with memory problems and increased patience on the part of your family members.   Often times, individuals with dementia are the last to be aware of their problems. The medical term for this lack of insight is called "anosognosia," which means absence of knowledge of illness. While their failure to recognize what seems obvious to everyone else can be frustrating, it is not their fault and is part of the disease itself. Trying to point out problems to the unaware individual is usually not helpful and is frequently harmful. What is helpful is to provide reassurance, redirect them, and avoid confronting them directly with the cognitive difficulties they are experiencing.  It is particularly important for individuals with anosognosia to get help planning for medical and other needs because they often are unaware of what their needs are.   It is unhelpful to argue with the demented individual, accuse them, ask them to do a task that is too hard for them to accomplish, or expect their previous level of performance. This is a frequent cause of catastrophic reactions and agitation, which is what I think is going on in your family. We will discuss more helpful ways to interact and deal with behavior problems. Some general recommendations are below:   Provide assistance as needed with memory-dependent or other cognitively challenging activities (e.g., doctors appointments, medications, planning trips, complex chores, paperwork or other forms).   Use simple, clear words that are easy to understand. Simplify information, be clear and concise.   Provide reassurance rather than trying to reason or give  explanations.   Suggest the word that the person may be looking for.   Help simplify steps of activities in daily life, such as writing down steps of a task. Eliminate tasks that are difficult and  frustrating.   Give instructions one step at a time.   Consistency, praise, and encouragement are very important; do not use criticism or punishment.   Agitated or catastrophic reactions are often a reaction to something in the environment or task difficulty/failure; know functional limits; when noticing the person getting upset, help redirect them away from the task and/or move to a different environment.    Genetic testing/counseling should be considered given strong family history of dementia. You wanted to think about that, which is reasonable.   For now, I recommend that you refrain from driving, given your family's report of difficulties and concerns. You should follow up with your PCP who is more familiar with your functioning.

## 2019-12-13 DIAGNOSIS — R413 Other amnesia: Secondary | ICD-10-CM | POA: Diagnosis not present

## 2019-12-13 DIAGNOSIS — M797 Fibromyalgia: Secondary | ICD-10-CM | POA: Diagnosis not present

## 2019-12-13 DIAGNOSIS — J309 Allergic rhinitis, unspecified: Secondary | ICD-10-CM | POA: Diagnosis not present

## 2019-12-14 DIAGNOSIS — M4727 Other spondylosis with radiculopathy, lumbosacral region: Secondary | ICD-10-CM | POA: Diagnosis not present

## 2019-12-14 DIAGNOSIS — M47817 Spondylosis without myelopathy or radiculopathy, lumbosacral region: Secondary | ICD-10-CM | POA: Diagnosis not present

## 2019-12-14 DIAGNOSIS — M545 Low back pain: Secondary | ICD-10-CM | POA: Diagnosis not present

## 2019-12-14 MED FILL — predniSONE 10 MG (48) TBPK: 10 | 12 days supply | Qty: 48 | Fill #0

## 2019-12-14 MED FILL — MEMANTINE HCL 10 MG TABS: 10 | 90 days supply | Qty: 180 | Fill #0

## 2019-12-20 MED FILL — BACLOFEN 20 MG TABS: 20 | 30 days supply | Qty: 60 | Fill #2

## 2019-12-20 MED FILL — ROSUVASTATIN CALCIUM 10 MG: 10 | 90 days supply | Qty: 90 | Fill #0

## 2019-12-20 MED FILL — traZODone HCL 50 MG TABS: 50 | 30 days supply | Qty: 30 | Fill #1

## 2019-12-22 MED FILL — METHYLPREDNISOLONE 4 MG TBP: 4 | 6 days supply | Qty: 21 | Fill #0

## 2019-12-26 DIAGNOSIS — H938X3 Other specified disorders of ear, bilateral: Secondary | ICD-10-CM | POA: Diagnosis not present

## 2019-12-26 DIAGNOSIS — J329 Chronic sinusitis, unspecified: Secondary | ICD-10-CM | POA: Diagnosis not present

## 2019-12-26 DIAGNOSIS — K580 Irritable bowel syndrome with diarrhea: Secondary | ICD-10-CM | POA: Diagnosis not present

## 2019-12-26 DIAGNOSIS — K529 Noninfective gastroenteritis and colitis, unspecified: Secondary | ICD-10-CM | POA: Diagnosis not present

## 2019-12-26 DIAGNOSIS — E739 Lactose intolerance, unspecified: Secondary | ICD-10-CM | POA: Diagnosis not present

## 2019-12-26 DIAGNOSIS — Z889 Allergy status to unspecified drugs, medicaments and biological substances status: Secondary | ICD-10-CM | POA: Diagnosis not present

## 2019-12-26 DIAGNOSIS — R252 Cramp and spasm: Secondary | ICD-10-CM | POA: Diagnosis not present

## 2019-12-26 DIAGNOSIS — J309 Allergic rhinitis, unspecified: Secondary | ICD-10-CM | POA: Diagnosis not present

## 2019-12-26 DIAGNOSIS — E785 Hyperlipidemia, unspecified: Secondary | ICD-10-CM | POA: Diagnosis not present

## 2019-12-26 DIAGNOSIS — J342 Deviated nasal septum: Secondary | ICD-10-CM | POA: Diagnosis not present

## 2019-12-26 DIAGNOSIS — E039 Hypothyroidism, unspecified: Secondary | ICD-10-CM | POA: Diagnosis not present

## 2019-12-26 DIAGNOSIS — Z8669 Personal history of other diseases of the nervous system and sense organs: Secondary | ICD-10-CM | POA: Diagnosis not present

## 2019-12-26 DIAGNOSIS — J3489 Other specified disorders of nose and nasal sinuses: Secondary | ICD-10-CM | POA: Diagnosis not present

## 2019-12-26 DIAGNOSIS — F329 Major depressive disorder, single episode, unspecified: Secondary | ICD-10-CM | POA: Diagnosis not present

## 2019-12-26 DIAGNOSIS — J310 Chronic rhinitis: Secondary | ICD-10-CM | POA: Diagnosis not present

## 2019-12-26 DIAGNOSIS — H9190 Unspecified hearing loss, unspecified ear: Secondary | ICD-10-CM | POA: Diagnosis not present

## 2019-12-26 DIAGNOSIS — K429 Umbilical hernia without obstruction or gangrene: Secondary | ICD-10-CM | POA: Diagnosis not present

## 2019-12-26 MED FILL — PROCTOZONE-HC 2.5 % CREA: 2.5 | 30 days supply | Qty: 60 | Fill #0

## 2019-12-27 MED FILL — OXYBUTYNIN CL ER 10 MG TAB: 10 | 90 days supply | Qty: 90 | Fill #0

## 2019-12-27 MED FILL — PANTOPRAZOLE SOD DR 40 MG T: 40 | 90 days supply | Qty: 90 | Fill #0

## 2019-12-27 MED FILL — MONTELUKAST SOD 10 MG TAB: 10 | 90 days supply | Qty: 90 | Fill #0

## 2019-12-27 MED FILL — PRAMIPEXOLE 0.125 MG TABLET: 0.125 | 30 days supply | Qty: 60 | Fill #0

## 2019-12-30 ENCOUNTER — Other Ambulatory Visit: Payer: 59

## 2020-01-03 DIAGNOSIS — M5416 Radiculopathy, lumbar region: Secondary | ICD-10-CM | POA: Diagnosis not present

## 2020-01-03 DIAGNOSIS — M545 Low back pain: Secondary | ICD-10-CM | POA: Diagnosis not present

## 2020-01-04 DIAGNOSIS — J0101 Acute recurrent maxillary sinusitis: Secondary | ICD-10-CM | POA: Diagnosis not present

## 2020-01-04 DIAGNOSIS — J329 Chronic sinusitis, unspecified: Secondary | ICD-10-CM | POA: Diagnosis not present

## 2020-01-10 MED FILL — POTASSIUM CHLORIDE CRYS ER: 10 | 15 days supply | Qty: 30 | Fill #0

## 2020-01-10 MED FILL — DONEPEZIL HCL 10 MG TABLET: 10 | 30 days supply | Qty: 30 | Fill #0

## 2020-01-10 MED FILL — FUROSEMIDE 20 MG TABS: 20 | 15 days supply | Qty: 30 | Fill #0

## 2020-01-11 DIAGNOSIS — M5440 Lumbago with sciatica, unspecified side: Secondary | ICD-10-CM | POA: Diagnosis not present

## 2020-01-11 DIAGNOSIS — N3281 Overactive bladder: Secondary | ICD-10-CM | POA: Diagnosis not present

## 2020-01-11 DIAGNOSIS — J309 Allergic rhinitis, unspecified: Secondary | ICD-10-CM | POA: Diagnosis not present

## 2020-01-12 MED FILL — BACLOFEN 20 MG TABS: 20 | 30 days supply | Qty: 60 | Fill #3

## 2020-01-16 DIAGNOSIS — J343 Hypertrophy of nasal turbinates: Secondary | ICD-10-CM | POA: Diagnosis not present

## 2020-01-16 DIAGNOSIS — J3489 Other specified disorders of nose and nasal sinuses: Secondary | ICD-10-CM | POA: Diagnosis not present

## 2020-01-16 DIAGNOSIS — R0981 Nasal congestion: Secondary | ICD-10-CM | POA: Diagnosis not present

## 2020-01-16 DIAGNOSIS — J309 Allergic rhinitis, unspecified: Secondary | ICD-10-CM | POA: Diagnosis not present

## 2020-01-16 DIAGNOSIS — E039 Hypothyroidism, unspecified: Secondary | ICD-10-CM | POA: Diagnosis not present

## 2020-01-16 DIAGNOSIS — J342 Deviated nasal septum: Secondary | ICD-10-CM | POA: Diagnosis not present

## 2020-01-16 DIAGNOSIS — H903 Sensorineural hearing loss, bilateral: Secondary | ICD-10-CM | POA: Diagnosis not present

## 2020-01-17 MED FILL — DICLOFENAC SODIUM 75 MG TAB: 75 | 30 days supply | Qty: 30 | Fill #1

## 2020-01-17 MED FILL — VIT D2 1.25 MG (50,000 UNIT: 1.25 MG | 84 days supply | Qty: 12 | Fill #0

## 2020-02-06 MED FILL — DONEPEZIL HCL 10 MG TABLET: 10 | 90 days supply | Qty: 90 | Fill #0

## 2020-02-07 DIAGNOSIS — J3489 Other specified disorders of nose and nasal sinuses: Secondary | ICD-10-CM | POA: Diagnosis not present

## 2020-02-07 DIAGNOSIS — E039 Hypothyroidism, unspecified: Secondary | ICD-10-CM | POA: Diagnosis not present

## 2020-02-07 DIAGNOSIS — J342 Deviated nasal septum: Secondary | ICD-10-CM | POA: Diagnosis not present

## 2020-02-07 DIAGNOSIS — J343 Hypertrophy of nasal turbinates: Secondary | ICD-10-CM | POA: Diagnosis not present

## 2020-02-07 DIAGNOSIS — R0981 Nasal congestion: Secondary | ICD-10-CM | POA: Diagnosis not present

## 2020-02-08 MED FILL — PRAMIPEXOLE 0.125 MG TABLET: 0.125 | 30 days supply | Qty: 60 | Fill #1

## 2020-02-09 DIAGNOSIS — Z1152 Encounter for screening for COVID-19: Secondary | ICD-10-CM | POA: Diagnosis not present

## 2020-02-09 DIAGNOSIS — Z1159 Encounter for screening for other viral diseases: Secondary | ICD-10-CM | POA: Diagnosis not present

## 2020-02-14 MED FILL — POTASSIUM CHLORIDE CRYS ER: 10 | 15 days supply | Qty: 30 | Fill #0

## 2020-02-14 MED FILL — FUROSEMIDE 20 MG TABS: 20 | 15 days supply | Qty: 30 | Fill #0

## 2020-02-14 MED FILL — DICLOFENAC SODIUM 75 MG TAB: 75 | 30 days supply | Qty: 30 | Fill #0

## 2020-02-15 DIAGNOSIS — E039 Hypothyroidism, unspecified: Secondary | ICD-10-CM | POA: Diagnosis not present

## 2020-02-15 DIAGNOSIS — K219 Gastro-esophageal reflux disease without esophagitis: Secondary | ICD-10-CM | POA: Diagnosis not present

## 2020-02-15 DIAGNOSIS — J342 Deviated nasal septum: Secondary | ICD-10-CM | POA: Diagnosis not present

## 2020-02-15 DIAGNOSIS — G4730 Sleep apnea, unspecified: Secondary | ICD-10-CM | POA: Diagnosis not present

## 2020-02-15 DIAGNOSIS — J302 Other seasonal allergic rhinitis: Secondary | ICD-10-CM | POA: Diagnosis not present

## 2020-02-15 DIAGNOSIS — J3489 Other specified disorders of nose and nasal sinuses: Secondary | ICD-10-CM | POA: Diagnosis not present

## 2020-02-15 DIAGNOSIS — R0981 Nasal congestion: Secondary | ICD-10-CM | POA: Diagnosis not present

## 2020-02-15 DIAGNOSIS — G4733 Obstructive sleep apnea (adult) (pediatric): Secondary | ICD-10-CM | POA: Diagnosis not present

## 2020-02-15 DIAGNOSIS — I10 Essential (primary) hypertension: Secondary | ICD-10-CM | POA: Diagnosis not present

## 2020-02-15 DIAGNOSIS — J343 Hypertrophy of nasal turbinates: Secondary | ICD-10-CM | POA: Diagnosis not present

## 2020-02-15 MED FILL — CLINDAMYCIN HCL 150 MG CAPS: 150 | 7 days supply | Qty: 21 | Fill #0

## 2020-02-22 DIAGNOSIS — M5440 Lumbago with sciatica, unspecified side: Secondary | ICD-10-CM | POA: Diagnosis not present

## 2020-02-22 DIAGNOSIS — K219 Gastro-esophageal reflux disease without esophagitis: Secondary | ICD-10-CM | POA: Diagnosis not present

## 2020-02-22 DIAGNOSIS — R600 Localized edema: Secondary | ICD-10-CM | POA: Diagnosis not present

## 2020-02-22 DIAGNOSIS — R413 Other amnesia: Secondary | ICD-10-CM | POA: Diagnosis not present

## 2020-02-29 DIAGNOSIS — M545 Low back pain: Secondary | ICD-10-CM | POA: Diagnosis not present

## 2020-02-29 DIAGNOSIS — M5136 Other intervertebral disc degeneration, lumbar region: Secondary | ICD-10-CM | POA: Diagnosis not present

## 2020-02-29 DIAGNOSIS — Z6841 Body Mass Index (BMI) 40.0 and over, adult: Secondary | ICD-10-CM | POA: Diagnosis not present

## 2020-03-02 DIAGNOSIS — I700 Atherosclerosis of aorta: Secondary | ICD-10-CM | POA: Diagnosis not present

## 2020-03-02 DIAGNOSIS — M5440 Lumbago with sciatica, unspecified side: Secondary | ICD-10-CM | POA: Diagnosis not present

## 2020-03-02 DIAGNOSIS — M79669 Pain in unspecified lower leg: Secondary | ICD-10-CM | POA: Diagnosis not present

## 2020-03-02 DIAGNOSIS — M7989 Other specified soft tissue disorders: Secondary | ICD-10-CM | POA: Diagnosis not present

## 2020-03-05 MED FILL — GABAPENTIN 300 MG CAPSULE: 300 | 90 days supply | Qty: 540 | Fill #0

## 2020-03-06 MED FILL — DULOXETINE HCL 60 MG CPEP: 60 | 90 days supply | Qty: 90 | Fill #0

## 2020-03-08 MED FILL — FUROSEMIDE 20 MG TABS: 20 | 22 days supply | Qty: 90 | Fill #0

## 2020-03-08 MED FILL — POTASSIUM CHLORIDE CRYS ER: 10 | 30 days supply | Qty: 30 | Fill #0

## 2020-03-13 MED FILL — PRAMIPEXOLE 0.125 MG TABLET: 0.125 | 30 days supply | Qty: 60 | Fill #0

## 2020-03-13 MED FILL — ROSUVASTATIN CALCIUM 10 MG: 10 | 90 days supply | Qty: 90 | Fill #0

## 2020-03-13 MED FILL — DICLOFENAC SODIUM 75 MG TAB: 75 | 30 days supply | Qty: 30 | Fill #1

## 2020-03-15 DIAGNOSIS — M47816 Spondylosis without myelopathy or radiculopathy, lumbar region: Secondary | ICD-10-CM | POA: Diagnosis not present

## 2020-03-15 DIAGNOSIS — M47896 Other spondylosis, lumbar region: Secondary | ICD-10-CM | POA: Diagnosis not present

## 2020-03-23 MED FILL — BACLOFEN 20 MG TABS: 20 | 30 days supply | Qty: 60 | Fill #4

## 2020-03-23 MED FILL — MEMANTINE HCL 10 MG TABS: 10 | 90 days supply | Qty: 90 | Fill #0

## 2020-03-29 MED FILL — traZODone HCL 50 MG TABS: 50 | 30 days supply | Qty: 30 | Fill #0

## 2020-03-30 DIAGNOSIS — E663 Overweight: Secondary | ICD-10-CM | POA: Diagnosis not present

## 2020-03-30 DIAGNOSIS — M51369 Other intervertebral disc degeneration, lumbar region without mention of lumbar back pain or lower extremity pain: Secondary | ICD-10-CM

## 2020-03-30 DIAGNOSIS — R638 Other symptoms and signs concerning food and fluid intake: Secondary | ICD-10-CM

## 2020-03-30 DIAGNOSIS — M545 Low back pain: Secondary | ICD-10-CM | POA: Diagnosis not present

## 2020-03-30 DIAGNOSIS — M5136 Other intervertebral disc degeneration, lumbar region: Secondary | ICD-10-CM

## 2020-03-30 HISTORY — DX: Other intervertebral disc degeneration, lumbar region without mention of lumbar back pain or lower extremity pain: M51.369

## 2020-03-30 HISTORY — DX: Other symptoms and signs concerning food and fluid intake: R63.8

## 2020-03-30 HISTORY — DX: Other intervertebral disc degeneration, lumbar region: M51.36

## 2020-04-02 ENCOUNTER — Telehealth: Payer: Self-pay | Admitting: Counselor

## 2020-04-02 NOTE — Telephone Encounter (Signed)
Patient is inquiring about Dr. Marca Ancona recommendation regarding her being allowed to drive. Patient requesting a call back.

## 2020-04-03 MED FILL — PANTOPRAZOLE SOD DR 40 MG T: 40 | 90 days supply | Qty: 90 | Fill #0

## 2020-04-03 MED FILL — MONTELUKAST SOD 10 MG TAB: 10 | 90 days supply | Qty: 90 | Fill #0

## 2020-04-03 MED FILL — OXYBUTYNIN CL ER 10 MG TAB: 10 | 90 days supply | Qty: 90 | Fill #0

## 2020-04-03 NOTE — Telephone Encounter (Signed)
I attempted to call the patient back several times and had issues with her number not ringing. At one point, it sounded as though someone picked up, but there was no response on the other end. My recommendation was that she cease driving and that was primarily based on her family's report that she is getting lost and having problems driving and her diagnosis of early onset dementia. Please feel free to clarify with her if she calls back and if she wishes to speak with me directly, see if there is another number she would prefer me to call.

## 2020-04-03 NOTE — Telephone Encounter (Signed)
I spoke with the patient and clarified that it was my recommendation she does not drive. I also clarified that this was based primarily on her family's report of difficulties, some individual with mild dementia are safe to drive whereas others are not. She could seek further evaluation to have more objective information, such as an on the road driving test, if desired.

## 2020-04-10 MED FILL — VIT D2 1.25 MG (50,000 UNIT: 1.25 MG | 84 days supply | Qty: 12 | Fill #0

## 2020-04-10 MED FILL — FUROSEMIDE 20 MG TABS: 20 | 45 days supply | Qty: 90 | Fill #0

## 2020-04-17 DIAGNOSIS — N3281 Overactive bladder: Secondary | ICD-10-CM | POA: Diagnosis not present

## 2020-04-17 DIAGNOSIS — K219 Gastro-esophageal reflux disease without esophagitis: Secondary | ICD-10-CM | POA: Diagnosis not present

## 2020-04-17 DIAGNOSIS — Z79899 Other long term (current) drug therapy: Secondary | ICD-10-CM | POA: Diagnosis not present

## 2020-04-17 DIAGNOSIS — M797 Fibromyalgia: Secondary | ICD-10-CM | POA: Diagnosis not present

## 2020-04-17 DIAGNOSIS — R5382 Chronic fatigue, unspecified: Secondary | ICD-10-CM | POA: Diagnosis not present

## 2020-04-17 DIAGNOSIS — R413 Other amnesia: Secondary | ICD-10-CM | POA: Diagnosis not present

## 2020-04-17 DIAGNOSIS — E785 Hyperlipidemia, unspecified: Secondary | ICD-10-CM | POA: Diagnosis not present

## 2020-04-17 DIAGNOSIS — M159 Polyosteoarthritis, unspecified: Secondary | ICD-10-CM | POA: Diagnosis not present

## 2020-04-17 DIAGNOSIS — E039 Hypothyroidism, unspecified: Secondary | ICD-10-CM | POA: Diagnosis not present

## 2020-04-17 DIAGNOSIS — E559 Vitamin D deficiency, unspecified: Secondary | ICD-10-CM | POA: Diagnosis not present

## 2020-04-17 DIAGNOSIS — J309 Allergic rhinitis, unspecified: Secondary | ICD-10-CM | POA: Diagnosis not present

## 2020-04-17 DIAGNOSIS — Z131 Encounter for screening for diabetes mellitus: Secondary | ICD-10-CM | POA: Diagnosis not present

## 2020-04-17 DIAGNOSIS — G629 Polyneuropathy, unspecified: Secondary | ICD-10-CM | POA: Diagnosis not present

## 2020-04-18 MED FILL — PRAMIPEXOLE 0.125 MG TABLET: 0.125 | 30 days supply | Qty: 60 | Fill #1

## 2020-04-18 MED FILL — POTASSIUM CHLORIDE CRYS ER: 10 | 15 days supply | Qty: 30 | Fill #0

## 2020-04-20 ENCOUNTER — Other Ambulatory Visit: Payer: Self-pay

## 2020-04-20 DIAGNOSIS — I739 Peripheral vascular disease, unspecified: Secondary | ICD-10-CM

## 2020-04-20 NOTE — Progress Notes (Signed)
vas 

## 2020-04-25 MED FILL — traZODone HCL 50 MG TABS: 50 | 30 days supply | Qty: 30 | Fill #1

## 2020-04-25 MED FILL — DICLOFENAC SODIUM 75 MG TAB: 75 | 30 days supply | Qty: 30 | Fill #0

## 2020-04-27 ENCOUNTER — Encounter: Payer: 59 | Admitting: Vascular Surgery

## 2020-04-27 ENCOUNTER — Encounter (HOSPITAL_COMMUNITY): Payer: 59

## 2020-04-30 MED FILL — POTASSIUM CHLORIDE CRYS ER: 10 | 15 days supply | Qty: 30 | Fill #0

## 2020-05-02 MED FILL — BACLOFEN 20 MG TABS: 20 | 30 days supply | Qty: 60 | Fill #5

## 2020-05-08 ENCOUNTER — Other Ambulatory Visit: Payer: Self-pay

## 2020-05-08 ENCOUNTER — Encounter: Payer: Self-pay | Admitting: Vascular Surgery

## 2020-05-08 ENCOUNTER — Ambulatory Visit (HOSPITAL_COMMUNITY)
Admission: RE | Admit: 2020-05-08 | Discharge: 2020-05-08 | Disposition: A | Discharge status: Home / Self Care | Payer: 59 | Source: Ambulatory Visit | Attending: Vascular Surgery | Admitting: Vascular Surgery

## 2020-05-08 ENCOUNTER — Ambulatory Visit (INDEPENDENT_AMBULATORY_CARE_PROVIDER_SITE_OTHER): Payer: 59 | Admitting: Vascular Surgery

## 2020-05-08 VITALS — BP 101/59 | HR 69 | Temp 98.2°F | Resp 20 | Ht 62.0 in | Wt 263.0 lb

## 2020-05-08 DIAGNOSIS — G609 Hereditary and idiopathic neuropathy, unspecified: Secondary | ICD-10-CM

## 2020-05-08 DIAGNOSIS — I739 Peripheral vascular disease, unspecified: Secondary | ICD-10-CM | POA: Insufficient documentation

## 2020-05-08 NOTE — Progress Notes (Signed)
Vascular and Vein Specialist of [place]  Patient name: Kelly Hansen MRN: [id number] DOB: [birth year] Sex: female  REASON FOR CONSULT: Evaluation of lower extremity discomfort to rule out arterial insufficiency  HPI: Kelly Hansen is a 54 y.o. female, who is here today for evaluation.  She reports sensation of numbness and tingling in her feet legs and now also into her hands and occasionally even into her face.  She reports this is worse at night.  The discomfort in her legs can awaken her from sleep and make it difficult for her to get to sleep.  She has no history of lower extremity tissue loss and has no claudication type symptoms.  She is not a diabetic.  Past Medical History:  Diagnosis Date   Acid reflux    Asthma    Depression    Thyroid disease     Family History  Problem Relation Age of Onset   Dementia Mother    Dementia Sister    Dementia Maternal Aunt    Dementia Maternal Uncle     SOCIAL HISTORY: Social History   Socioeconomic History   Marital status: Married    Spouse name: Not on file   Number of children: Not on file   Years of education: Not on file   Highest education level: Not on file  Occupational History   Not on file  Tobacco Use   Smoking status: Former Smoker    Quit date: 03/10/2011    Years since quitting: 9.1   Smokeless tobacco: Never Used  Substance and Sexual Activity   Alcohol use: No   Drug use: No   Sexual activity: Not on file  Other Topics Concern   Not on file  Social History Narrative   Right handed   Lives with family    One story home few steps to get into home    Social Determinants of Health   Financial Resource Strain:    Difficulty of Paying Living Expenses: Not on file  Food Insecurity:    Worried About Programme researcher, broadcasting/film/video in the Last Year: Not on file   [employer] of Food in the Last Year: Not on file  Transportation Needs:    Lack of  Transportation (Medical): Not on file   Lack of Transportation (Non-Medical): Not on file  Physical Activity:    Days of Exercise per Week: Not on file   Minutes of Exercise per Session: Not on file  Stress:    Feeling of Stress : Not on file  Social Connections:    Frequency of Communication with Friends and Family: Not on file   Frequency of Social Gatherings with Friends and Family: Not on file   Attends Religious Services: Not on file   Active Member of Clubs or Organizations: Not on file   Attends Banker Meetings: Not on file   Marital Status: Not on file  Intimate Partner Violence:    Fear of Current or Ex-Partner: Not on file   Emotionally Abused: Not on file   Physically Abused: Not on file   Sexually Abused: Not on file    Allergies  Allergen Reactions   Penicillins    Sulfa Antibiotics     Current Outpatient Medications  Medication Sig Dispense Refill   baclofen (LIORESAL) 20 MG tablet Take 20 mg by mouth 2 (two) times daily as needed for mild pain.     diclofenac (VOLTAREN) 75 MG EC tablet Take 75 mg by  mouth 2 (two) times daily.     donepezil (ARICEPT) 10 MG tablet Take 10 mg by mouth daily.     DULoxetine (CYMBALTA) 60 MG capsule Take 60 mg by mouth daily.     ergocalciferol (VITAMIN D2) 1.25 MG (50000 UT) capsule Take 50,000 Units by mouth once a week. Thursday     furosemide (LASIX) 20 MG tablet Take 40 mg by mouth 2 (two) times daily.      gabapentin (NEURONTIN) 300 MG capsule Take 600 mg by mouth 3 (three) times daily.      levothyroxine (SYNTHROID) 25 MCG tablet Take 50 mcg by mouth daily.     loperamide (IMODIUM) 2 MG capsule Take 1 capsule (2 mg total) by mouth 4 (four) times daily as needed for diarrhea or loose stools. 12 capsule 0   Meclizine HCl 25 MG CHEW Chew 25 mg by mouth 3 (three) times daily as needed for dizziness.     memantine (NAMENDA) 10 MG tablet Take 10 mg by mouth daily. Taking it once a day      montelukast (SINGULAIR) 10 MG tablet Take 10 mg by mouth at bedtime.     pantoprazole (PROTONIX) 40 MG tablet Take 40 mg by mouth daily.     pramipexole (MIRAPEX) 0.125 MG tablet Take 0.125 mg by mouth 2 (two) times daily.     rosuvastatin (CRESTOR) 10 MG tablet Take 10 mg by mouth daily.     traZODone (DESYREL) 50 MG tablet Take 50 mg by mouth at bedtime. Take 1/2 tablet every night     UCERIS 9 MG TB24 Take 9 mg by mouth daily.     No current facility-administered medications for this visit.    REVIEW OF SYSTEMS:  [X]  denotes positive finding, [ ]  denotes negative finding Cardiac  Comments:  Chest pain or chest pressure:    Shortness of breath upon exertion:    Short of breath when lying flat:    Irregular heart rhythm:        Vascular    Pain in calf, thigh, or hip brought on by ambulation: x   Pain in feet at night that wakes you up from your sleep:  x   Blood clot in your veins:    Leg swelling:  x       Pulmonary    Oxygen at home:    Productive cough:     Wheezing:         Neurologic    Sudden weakness in arms or legs:     Sudden numbness in arms or legs:  x   Sudden onset of difficulty speaking or slurred speech:    Temporary loss of vision in one eye:     Problems with dizziness:         Gastrointestinal    Blood in stool:     Vomited blood:         Genitourinary    Burning when urinating:     Blood in urine: x       Psychiatric    Major depression:         Hematologic    Bleeding problems:    Problems with blood clotting too easily:        Skin    Rashes or ulcers:        Constitutional    Fever or chills:      PHYSICAL EXAM: Vitals:   05/08/20 1415  BP: (!) 101/59  Pulse: 69  Resp: 20  Temp: 98.2 F (36.8 C)  SpO2: 93%  Weight: 263 lb (119.3 kg)  Height: 5\' 2"  (1.575 m)    GENERAL: The patient is a well-nourished female, in no acute distress. The vital signs are documented above. CARDIOVASCULAR: 2+ radial and 2+ dorsalis pedis  pulses bilaterally PULMONARY: There is good air exchange  ABDOMEN: Soft and non-tender  MUSCULOSKELETAL: There are no major deformities or cyanosis. NEUROLOGIC: No focal weakness or paresthesias are detected. SKIN: There are no ulcers or rashes noted.  She does not have any significant swelling today.  Does have some thickening in the medial aspect of her skin associated with chronic swelling PSYCHIATRIC: The patient has a normal affect.  DATA:  Lower extremity arterial studies revealed completely normal lower extremity exam with normal pressures and triphasic waveforms  MEDICAL ISSUES: I discussed these findings with the patient.  I explained that do not see any evidence of arterial insufficiency or significant venous pathology to explain her symptoms.  This does appear to be associated with neuropathy.  She is getting some relief with an increased dose of Neurontin.  She was reassured with this discussion and will see again on an as-needed basis   [place], MD [facility] Vascular and Vein Specialists of [facility] Tel [phone] Pager [phone]

## 2020-05-09 DIAGNOSIS — M7989 Other specified soft tissue disorders: Secondary | ICD-10-CM | POA: Diagnosis not present

## 2020-05-09 DIAGNOSIS — Z6841 Body Mass Index (BMI) 40.0 and over, adult: Secondary | ICD-10-CM | POA: Diagnosis not present

## 2020-05-09 DIAGNOSIS — J019 Acute sinusitis, unspecified: Secondary | ICD-10-CM | POA: Diagnosis not present

## 2020-05-09 DIAGNOSIS — M79669 Pain in unspecified lower leg: Secondary | ICD-10-CM | POA: Diagnosis not present

## 2020-05-09 DIAGNOSIS — J9801 Acute bronchospasm: Secondary | ICD-10-CM | POA: Diagnosis not present

## 2020-05-09 MED FILL — predniSONE 10 MG (21) TBPK: 10 | 6 days supply | Qty: 21 | Fill #0

## 2020-05-09 MED FILL — AZITHROMYCIN 250 MG TABLET: 250 | 5 days supply | Qty: 6 | Fill #0

## 2020-05-10 MED FILL — ALBUTEROL SULFATE HFA 108 (: 108 (90 BAS | 33 days supply | Qty: 18 | Fill #0

## 2020-05-19 MED FILL — FUROSEMIDE 20 MG TABS: 20 | 15 days supply | Qty: 30 | Fill #0

## 2020-05-22 MED FILL — MEMANTINE HCL 10 MG TABS: 10 | 90 days supply | Qty: 180 | Fill #0

## 2020-05-22 MED FILL — POTASSIUM CHLORIDE CRYS ER: 10 | 15 days supply | Qty: 30 | Fill #0

## 2020-05-28 ENCOUNTER — Other Ambulatory Visit (HOSPITAL_COMMUNITY): Payer: Self-pay | Admitting: Internal Medicine

## 2020-05-28 MED FILL — traZODone HCL 50 MG TABS: 50 | 30 days supply | Qty: 30 | Fill #0

## 2020-05-30 MED FILL — DONEPEZIL HCL 10 MG TABLET: 10 | 90 days supply | Qty: 90 | Fill #0

## 2020-05-30 MED FILL — DICLOFENAC SODIUM 75 MG TAB: 75 | 30 days supply | Qty: 30 | Fill #1

## 2020-05-31 MED FILL — FUROSEMIDE 20 MG TABS: 20 | 22 days supply | Qty: 90 | Fill #0

## 2020-06-04 DIAGNOSIS — R0609 Other forms of dyspnea: Secondary | ICD-10-CM | POA: Diagnosis not present

## 2020-06-04 DIAGNOSIS — R0602 Shortness of breath: Secondary | ICD-10-CM | POA: Diagnosis not present

## 2020-06-04 DIAGNOSIS — J40 Bronchitis, not specified as acute or chronic: Secondary | ICD-10-CM | POA: Diagnosis not present

## 2020-06-04 DIAGNOSIS — Z6841 Body Mass Index (BMI) 40.0 and over, adult: Secondary | ICD-10-CM | POA: Diagnosis not present

## 2020-06-04 DIAGNOSIS — B948 Sequelae of other specified infectious and parasitic diseases: Secondary | ICD-10-CM | POA: Diagnosis not present

## 2020-06-04 DIAGNOSIS — G629 Polyneuropathy, unspecified: Secondary | ICD-10-CM | POA: Diagnosis not present

## 2020-06-04 DIAGNOSIS — J9801 Acute bronchospasm: Secondary | ICD-10-CM | POA: Diagnosis not present

## 2020-06-04 MED FILL — ADVAIR 250/50 DISKUS: 250-50 | 30 days supply | Qty: 60 | Fill #0

## 2020-06-04 MED FILL — PREGABALIN 75 MG CAPS: 75 | 30 days supply | Qty: 60 | Fill #0

## 2020-06-11 ENCOUNTER — Other Ambulatory Visit (HOSPITAL_COMMUNITY): Payer: Self-pay | Admitting: Internal Medicine

## 2020-06-11 MED FILL — PRAMIPEXOLE 0.125 MG TABLET: 0.125 | 30 days supply | Qty: 60 | Fill #0

## 2020-06-20 ENCOUNTER — Other Ambulatory Visit (HOSPITAL_COMMUNITY): Payer: Self-pay | Admitting: Internal Medicine

## 2020-06-20 MED FILL — GABAPENTIN 300 MG CAPSULE: 300 | 90 days supply | Qty: 540 | Fill #0

## 2020-06-20 MED FILL — ROSUVASTATIN CALCIUM 10 MG: 10 | 90 days supply | Qty: 90 | Fill #0

## 2020-06-20 MED FILL — BACLOFEN 20 MG TABS: 20 | 30 days supply | Qty: 60 | Fill #0

## 2020-06-22 ENCOUNTER — Other Ambulatory Visit (HOSPITAL_COMMUNITY): Payer: Self-pay | Admitting: Internal Medicine

## 2020-06-22 MED FILL — PROCTOZONE-HC 2.5 % CREA: 2.5 | 30 days supply | Qty: 60 | Fill #0

## 2020-06-27 MED FILL — traZODone HCL 50 MG TABS: 50 | 30 days supply | Qty: 30 | Fill #1

## 2020-06-28 ENCOUNTER — Other Ambulatory Visit (HOSPITAL_COMMUNITY): Payer: Self-pay | Admitting: Internal Medicine

## 2020-06-28 MED FILL — FUROSEMIDE 20 MG TABS: 20 | 22 days supply | Qty: 90 | Fill #0

## 2020-06-28 MED FILL — DICLOFENAC SODIUM 75 MG TAB: 75 | 30 days supply | Qty: 30 | Fill #0

## 2020-06-28 MED FILL — MONTELUKAST SOD 10 MG TAB: 10 | 90 days supply | Qty: 90 | Fill #0

## 2020-06-28 MED FILL — VIT D2 1.25 MG (50,000 UNIT: 1.25 MG | 84 days supply | Qty: 12 | Fill #0

## 2020-06-29 ENCOUNTER — Other Ambulatory Visit (HOSPITAL_COMMUNITY): Payer: Self-pay | Admitting: Internal Medicine

## 2020-06-29 MED FILL — POTASSIUM CHLORIDE CRYS ER: 10 | 15 days supply | Qty: 30 | Fill #0

## 2020-07-02 ENCOUNTER — Other Ambulatory Visit (HOSPITAL_COMMUNITY): Payer: Self-pay | Admitting: Internal Medicine

## 2020-07-02 DIAGNOSIS — H9193 Unspecified hearing loss, bilateral: Secondary | ICD-10-CM | POA: Diagnosis not present

## 2020-07-02 DIAGNOSIS — M7989 Other specified soft tissue disorders: Secondary | ICD-10-CM | POA: Diagnosis not present

## 2020-07-02 DIAGNOSIS — M79605 Pain in left leg: Secondary | ICD-10-CM | POA: Diagnosis not present

## 2020-07-02 DIAGNOSIS — F418 Other specified anxiety disorders: Secondary | ICD-10-CM | POA: Diagnosis not present

## 2020-07-02 DIAGNOSIS — J309 Allergic rhinitis, unspecified: Secondary | ICD-10-CM | POA: Diagnosis not present

## 2020-07-02 DIAGNOSIS — M79604 Pain in right leg: Secondary | ICD-10-CM | POA: Diagnosis not present

## 2020-07-02 MED FILL — PREGABALIN 75 MG CAPS: 75 | 30 days supply | Qty: 60 | Fill #0

## 2020-07-12 ENCOUNTER — Other Ambulatory Visit (HOSPITAL_COMMUNITY): Payer: Self-pay | Admitting: Internal Medicine

## 2020-07-12 MED FILL — PANTOPRAZOLE SOD DR 40 MG T: 40 | 90 days supply | Qty: 90 | Fill #0

## 2020-07-12 MED FILL — OXYBUTYNIN CL ER 10 MG TAB: 10 | 90 days supply | Qty: 90 | Fill #0

## 2020-07-18 MED FILL — BACLOFEN 20 MG TABS: 20 | 30 days supply | Qty: 60 | Fill #1

## 2020-07-19 MED FILL — PRAMIPEXOLE 0.125 MG TABLET: 0.125 | 30 days supply | Qty: 60 | Fill #1

## 2020-07-25 ENCOUNTER — Other Ambulatory Visit (HOSPITAL_COMMUNITY): Payer: Self-pay | Admitting: Internal Medicine

## 2020-07-25 MED FILL — traZODone HCL 50 MG TABS: 50 | 30 days supply | Qty: 30 | Fill #0

## 2020-08-06 ENCOUNTER — Other Ambulatory Visit (HOSPITAL_COMMUNITY): Payer: Self-pay | Admitting: Internal Medicine

## 2020-08-06 DIAGNOSIS — M25561 Pain in right knee: Secondary | ICD-10-CM | POA: Diagnosis not present

## 2020-08-06 DIAGNOSIS — M47816 Spondylosis without myelopathy or radiculopathy, lumbar region: Secondary | ICD-10-CM | POA: Diagnosis not present

## 2020-08-06 DIAGNOSIS — M25511 Pain in right shoulder: Secondary | ICD-10-CM | POA: Diagnosis not present

## 2020-08-06 DIAGNOSIS — M25562 Pain in left knee: Secondary | ICD-10-CM | POA: Diagnosis not present

## 2020-08-06 DIAGNOSIS — G8929 Other chronic pain: Secondary | ICD-10-CM | POA: Diagnosis not present

## 2020-08-06 DIAGNOSIS — Z6841 Body Mass Index (BMI) 40.0 and over, adult: Secondary | ICD-10-CM | POA: Diagnosis not present

## 2020-08-06 DIAGNOSIS — M25552 Pain in left hip: Secondary | ICD-10-CM | POA: Diagnosis not present

## 2020-08-06 DIAGNOSIS — M25551 Pain in right hip: Secondary | ICD-10-CM | POA: Diagnosis not present

## 2020-08-06 DIAGNOSIS — M159 Polyosteoarthritis, unspecified: Secondary | ICD-10-CM | POA: Diagnosis not present

## 2020-08-06 DIAGNOSIS — E785 Hyperlipidemia, unspecified: Secondary | ICD-10-CM | POA: Diagnosis not present

## 2020-08-06 MED FILL — PREGABALIN 75 MG CAPS: 75 | 30 days supply | Qty: 60 | Fill #0

## 2020-08-07 MED FILL — DICLOFENAC SODIUM 75 MG TAB: 75 | 30 days supply | Qty: 30 | Fill #1

## 2020-08-08 ENCOUNTER — Other Ambulatory Visit (HOSPITAL_COMMUNITY): Payer: Self-pay | Admitting: Internal Medicine

## 2020-08-08 MED FILL — FUROSEMIDE 20 MG TABS: 20 | 45 days supply | Qty: 180 | Fill #0

## 2020-08-13 ENCOUNTER — Other Ambulatory Visit (HOSPITAL_COMMUNITY): Payer: Self-pay | Admitting: Internal Medicine

## 2020-08-13 DIAGNOSIS — J019 Acute sinusitis, unspecified: Secondary | ICD-10-CM | POA: Diagnosis not present

## 2020-08-13 DIAGNOSIS — Z6841 Body Mass Index (BMI) 40.0 and over, adult: Secondary | ICD-10-CM | POA: Diagnosis not present

## 2020-08-13 DIAGNOSIS — J40 Bronchitis, not specified as acute or chronic: Secondary | ICD-10-CM | POA: Diagnosis not present

## 2020-08-14 MED FILL — predniSONE 10 MG (21) TBPK: 10 | 6 days supply | Qty: 21 | Fill #0

## 2020-08-14 MED FILL — AZITHROMYCIN 250 MG TABLET: 250 | 5 days supply | Qty: 6 | Fill #0

## 2020-08-16 ENCOUNTER — Other Ambulatory Visit (HOSPITAL_COMMUNITY): Payer: Self-pay | Admitting: Internal Medicine

## 2020-08-16 MED FILL — DULOXETINE HCL 60 MG CPEP: 60 | 90 days supply | Qty: 90 | Fill #0

## 2020-08-23 ENCOUNTER — Other Ambulatory Visit (HOSPITAL_COMMUNITY): Payer: Self-pay | Admitting: Internal Medicine

## 2020-08-23 MED FILL — BACLOFEN 20 MG TABS: 20 | 30 days supply | Qty: 60 | Fill #2

## 2020-08-23 MED FILL — MEMANTINE HCL 10 MG TABS: 10 | 90 days supply | Qty: 180 | Fill #0

## 2020-08-23 MED FILL — traZODone HCL 50 MG TABS: 50 | 30 days supply | Qty: 30 | Fill #1

## 2020-08-23 MED FILL — PRAMIPEXOLE 0.125 MG TABLET: 0.125 | 30 days supply | Qty: 60 | Fill #0

## 2020-08-24 DIAGNOSIS — R058 Other specified cough: Secondary | ICD-10-CM | POA: Diagnosis not present

## 2020-08-24 DIAGNOSIS — R059 Cough, unspecified: Secondary | ICD-10-CM | POA: Diagnosis not present

## 2020-08-28 MED FILL — PROMETHAZINE W/COD SYRUP: 6.25-10 | 20 days supply | Qty: 200 | Fill #0

## 2020-09-05 ENCOUNTER — Other Ambulatory Visit (HOSPITAL_COMMUNITY): Payer: Self-pay | Admitting: Internal Medicine

## 2020-09-05 DIAGNOSIS — K219 Gastro-esophageal reflux disease without esophagitis: Secondary | ICD-10-CM | POA: Diagnosis not present

## 2020-09-05 DIAGNOSIS — G4700 Insomnia, unspecified: Secondary | ICD-10-CM | POA: Diagnosis not present

## 2020-09-05 DIAGNOSIS — E559 Vitamin D deficiency, unspecified: Secondary | ICD-10-CM | POA: Diagnosis not present

## 2020-09-05 DIAGNOSIS — M5440 Lumbago with sciatica, unspecified side: Secondary | ICD-10-CM | POA: Diagnosis not present

## 2020-09-05 DIAGNOSIS — R413 Other amnesia: Secondary | ICD-10-CM | POA: Diagnosis not present

## 2020-09-05 DIAGNOSIS — N3281 Overactive bladder: Secondary | ICD-10-CM | POA: Diagnosis not present

## 2020-09-05 MED FILL — PREGABALIN 100 MG CAPS: 100 | 30 days supply | Qty: 60 | Fill #0

## 2020-09-05 MED FILL — ZOLPIDEM TARTRATE 5 MG TABS: 5 | 30 days supply | Qty: 30 | Fill #0

## 2020-09-06 ENCOUNTER — Other Ambulatory Visit (HOSPITAL_COMMUNITY): Payer: Self-pay | Admitting: Internal Medicine

## 2020-09-06 MED FILL — DICLOFENAC SODIUM 75 MG TAB: 75 | 30 days supply | Qty: 30 | Fill #0

## 2020-09-06 MED FILL — DONEPEZIL HCL 10 MG TABLET: 10 | 90 days supply | Qty: 90 | Fill #0

## 2020-09-06 MED FILL — POTASSIUM CHLORIDE CRYS ER: 10 | 15 days supply | Qty: 30 | Fill #0

## 2020-09-18 DIAGNOSIS — R059 Cough, unspecified: Secondary | ICD-10-CM | POA: Diagnosis not present

## 2020-09-18 DIAGNOSIS — Z20822 Contact with and (suspected) exposure to covid-19: Secondary | ICD-10-CM | POA: Diagnosis not present

## 2020-09-18 DIAGNOSIS — J209 Acute bronchitis, unspecified: Secondary | ICD-10-CM | POA: Diagnosis not present

## 2020-09-20 ENCOUNTER — Other Ambulatory Visit (HOSPITAL_COMMUNITY): Payer: Self-pay | Admitting: Internal Medicine

## 2020-09-20 MED FILL — ROSUVASTATIN CALCIUM 10 MG: 10 | 90 days supply | Qty: 90 | Fill #0

## 2020-09-27 ENCOUNTER — Other Ambulatory Visit (HOSPITAL_COMMUNITY): Payer: Self-pay | Admitting: Internal Medicine

## 2020-09-27 MED FILL — PRAMIPEXOLE 0.125 MG TABLET: 0.125 | 30 days supply | Qty: 60 | Fill #1

## 2020-09-27 MED FILL — BACLOFEN 20 MG TABS: 20 | 30 days supply | Qty: 60 | Fill #3

## 2020-09-27 MED FILL — MONTELUKAST SOD 10 MG TAB: 10 | 90 days supply | Qty: 90 | Fill #0

## 2020-10-04 ENCOUNTER — Other Ambulatory Visit (HOSPITAL_COMMUNITY): Payer: Self-pay | Admitting: Internal Medicine

## 2020-10-04 DIAGNOSIS — J309 Allergic rhinitis, unspecified: Secondary | ICD-10-CM | POA: Diagnosis not present

## 2020-10-04 DIAGNOSIS — E559 Vitamin D deficiency, unspecified: Secondary | ICD-10-CM | POA: Diagnosis not present

## 2020-10-04 DIAGNOSIS — M5440 Lumbago with sciatica, unspecified side: Secondary | ICD-10-CM | POA: Diagnosis not present

## 2020-10-04 DIAGNOSIS — G4700 Insomnia, unspecified: Secondary | ICD-10-CM | POA: Diagnosis not present

## 2020-10-04 MED FILL — VIT D2 1.25 MG (50,000 UNIT: 1.25 MG | 84 days supply | Qty: 12 | Fill #0

## 2020-10-04 MED FILL — PREGABALIN 100 MG CAPS: 100 | 30 days supply | Qty: 60 | Fill #0

## 2020-10-04 MED FILL — ZOLPIDEM TARTRATE 5 MG TAB: 5 | 30 days supply | Qty: 30 | Fill #0

## 2020-10-16 ENCOUNTER — Other Ambulatory Visit (HOSPITAL_COMMUNITY): Payer: Self-pay | Admitting: Internal Medicine

## 2020-10-16 MED FILL — POTASSIUM CHLORIDE CRYS ER: 10 | 15 days supply | Qty: 30 | Fill #0

## 2020-10-17 ENCOUNTER — Other Ambulatory Visit (HOSPITAL_COMMUNITY): Payer: Self-pay | Admitting: Internal Medicine

## 2020-10-17 MED FILL — GABAPENTIN 300 MG CAPSULE: 300 | 90 days supply | Qty: 540 | Fill #0

## 2020-10-17 MED FILL — DICLOFENAC SODIUM 75 MG TAB: 75 | 30 days supply | Qty: 30 | Fill #1

## 2020-10-22 ENCOUNTER — Other Ambulatory Visit (HOSPITAL_COMMUNITY): Payer: Self-pay | Admitting: Internal Medicine

## 2020-10-22 MED FILL — PANTOPRAZOLE SOD DR 40 MG T: 40 | 90 days supply | Qty: 90 | Fill #0

## 2020-10-22 MED FILL — PRAMIPEXOLE 0.125 MG TABLET: 0.125 | 30 days supply | Qty: 60 | Fill #0

## 2020-10-22 MED FILL — OXYBUTYNIN CL ER 10 MG TAB: 10 | 90 days supply | Qty: 90 | Fill #0

## 2020-10-29 MED FILL — BACLOFEN 20 MG TABS: 20 | 30 days supply | Qty: 60 | Fill #4

## 2020-11-05 ENCOUNTER — Other Ambulatory Visit (HOSPITAL_COMMUNITY): Payer: Self-pay | Admitting: Internal Medicine

## 2020-11-05 DIAGNOSIS — E559 Vitamin D deficiency, unspecified: Secondary | ICD-10-CM | POA: Diagnosis not present

## 2020-11-05 DIAGNOSIS — Z79899 Other long term (current) drug therapy: Secondary | ICD-10-CM | POA: Diagnosis not present

## 2020-11-05 DIAGNOSIS — M159 Polyosteoarthritis, unspecified: Secondary | ICD-10-CM | POA: Diagnosis not present

## 2020-11-05 DIAGNOSIS — M238X1 Other internal derangements of right knee: Secondary | ICD-10-CM | POA: Diagnosis not present

## 2020-11-05 DIAGNOSIS — G4700 Insomnia, unspecified: Secondary | ICD-10-CM | POA: Diagnosis not present

## 2020-11-05 DIAGNOSIS — M7989 Other specified soft tissue disorders: Secondary | ICD-10-CM | POA: Diagnosis not present

## 2020-11-05 DIAGNOSIS — G8929 Other chronic pain: Secondary | ICD-10-CM | POA: Diagnosis not present

## 2020-11-05 DIAGNOSIS — M25561 Pain in right knee: Secondary | ICD-10-CM | POA: Diagnosis not present

## 2020-11-05 DIAGNOSIS — E039 Hypothyroidism, unspecified: Secondary | ICD-10-CM | POA: Diagnosis not present

## 2020-11-05 DIAGNOSIS — M25562 Pain in left knee: Secondary | ICD-10-CM | POA: Diagnosis not present

## 2020-11-05 MED FILL — ZOLPIDEM TARTRATE 5 MG TABS: 5 | 30 days supply | Qty: 30 | Fill #0

## 2020-11-05 MED FILL — FUROSEMIDE 20 MG TABS: 20 | 45 days supply | Qty: 180 | Fill #0

## 2020-11-05 MED FILL — LEVOTHYROXINE SODIUM 50 MCG: 50 | 90 days supply | Qty: 90 | Fill #0

## 2020-11-05 MED FILL — PREGABALIN 100 MG CAPS: 100 | 30 days supply | Qty: 60 | Fill #0

## 2020-11-05 MED FILL — POTASSIUM CHLORIDE CRYS ER: 10 | 45 days supply | Qty: 90 | Fill #0

## 2020-11-06 ENCOUNTER — Other Ambulatory Visit (HOSPITAL_COMMUNITY): Payer: Self-pay | Admitting: Physician Assistant

## 2020-11-09 DIAGNOSIS — Z79899 Other long term (current) drug therapy: Secondary | ICD-10-CM | POA: Diagnosis not present

## 2020-11-09 DIAGNOSIS — E559 Vitamin D deficiency, unspecified: Secondary | ICD-10-CM | POA: Diagnosis not present

## 2020-11-14 ENCOUNTER — Other Ambulatory Visit (HOSPITAL_COMMUNITY): Payer: Self-pay | Admitting: Internal Medicine

## 2020-11-19 ENCOUNTER — Other Ambulatory Visit (HOSPITAL_COMMUNITY): Payer: Self-pay | Admitting: Internal Medicine

## 2020-11-19 MED FILL — DICLOFENAC SODIUM 75 MG TAB: 75 | 30 days supply | Qty: 30 | Fill #0

## 2020-11-19 MED FILL — DULOXETINE HCL 60 MG CPEP: 60 | 90 days supply | Qty: 90 | Fill #0

## 2020-11-22 DIAGNOSIS — M25561 Pain in right knee: Secondary | ICD-10-CM | POA: Diagnosis not present

## 2020-11-28 DIAGNOSIS — M25561 Pain in right knee: Secondary | ICD-10-CM | POA: Diagnosis not present

## 2020-12-03 ENCOUNTER — Other Ambulatory Visit (HOSPITAL_COMMUNITY): Payer: Self-pay | Admitting: Internal Medicine

## 2020-12-03 DIAGNOSIS — J309 Allergic rhinitis, unspecified: Secondary | ICD-10-CM | POA: Diagnosis not present

## 2020-12-03 DIAGNOSIS — F320 Major depressive disorder, single episode, mild: Secondary | ICD-10-CM | POA: Diagnosis not present

## 2020-12-03 DIAGNOSIS — G4700 Insomnia, unspecified: Secondary | ICD-10-CM | POA: Diagnosis not present

## 2020-12-03 DIAGNOSIS — M797 Fibromyalgia: Secondary | ICD-10-CM | POA: Diagnosis not present

## 2020-12-03 DIAGNOSIS — E785 Hyperlipidemia, unspecified: Secondary | ICD-10-CM | POA: Diagnosis not present

## 2020-12-03 DIAGNOSIS — K219 Gastro-esophageal reflux disease without esophagitis: Secondary | ICD-10-CM | POA: Diagnosis not present

## 2020-12-03 DIAGNOSIS — R413 Other amnesia: Secondary | ICD-10-CM | POA: Diagnosis not present

## 2020-12-03 MED FILL — ZOLPIDEM TARTRATE 5 MG TABS: 5 | 30 days supply | Qty: 30 | Fill #0

## 2020-12-03 MED FILL — PREGABALIN 100 MG CAPS: 100 | 30 days supply | Qty: 60 | Fill #0

## 2020-12-07 ENCOUNTER — Other Ambulatory Visit (HOSPITAL_BASED_OUTPATIENT_CLINIC_OR_DEPARTMENT_OTHER): Payer: Self-pay

## 2020-12-10 MED FILL — BACLOFEN 20 MG TABS: 20 | 30 days supply | Qty: 60 | Fill #5

## 2020-12-23 ENCOUNTER — Other Ambulatory Visit (HOSPITAL_COMMUNITY): Payer: Self-pay

## 2020-12-25 ENCOUNTER — Other Ambulatory Visit (HOSPITAL_COMMUNITY): Payer: Self-pay

## 2020-12-25 DIAGNOSIS — M5440 Lumbago with sciatica, unspecified side: Secondary | ICD-10-CM | POA: Diagnosis not present

## 2020-12-25 DIAGNOSIS — J309 Allergic rhinitis, unspecified: Secondary | ICD-10-CM | POA: Diagnosis not present

## 2020-12-25 DIAGNOSIS — E559 Vitamin D deficiency, unspecified: Secondary | ICD-10-CM | POA: Diagnosis not present

## 2020-12-25 DIAGNOSIS — G4700 Insomnia, unspecified: Secondary | ICD-10-CM | POA: Diagnosis not present

## 2020-12-25 MED ORDER — ERGOCALCIFEROL 1.25 MG (50000 UT) PO CAPS
ORAL_CAPSULE | ORAL | 0 refills | Status: DC
  Filled 2020-12-25: qty 12, 84d supply, fill #0

## 2020-12-25 MED ORDER — ZOLPIDEM TARTRATE 5 MG PO TABS
ORAL_TABLET | ORAL | 0 refills | Status: DC
  Filled 2020-12-25: qty 30, 30d supply, fill #0

## 2020-12-25 MED ORDER — PREGABALIN 100 MG PO CAPS
ORAL_CAPSULE | ORAL | 0 refills | Status: DC
  Filled 2020-12-25: qty 60, 30d supply, fill #0

## 2020-12-25 MED ORDER — FLUTICASONE-SALMETEROL 250-50 MCG/DOSE IN AEPB
INHALATION_SPRAY | RESPIRATORY_TRACT | 0 refills | Status: DC
  Filled 2020-12-25: qty 180, 90d supply, fill #0

## 2020-12-25 MED ORDER — ALBUTEROL SULFATE HFA 108 (90 BASE) MCG/ACT IN AERS
INHALATION_SPRAY | RESPIRATORY_TRACT | 0 refills | Status: DC
  Filled 2020-12-25: qty 18, 30d supply, fill #0

## 2020-12-26 ENCOUNTER — Other Ambulatory Visit (HOSPITAL_COMMUNITY): Payer: Self-pay

## 2020-12-28 ENCOUNTER — Other Ambulatory Visit (HOSPITAL_COMMUNITY): Payer: Self-pay

## 2020-12-31 ENCOUNTER — Other Ambulatory Visit (HOSPITAL_COMMUNITY): Payer: Self-pay

## 2020-12-31 MED ORDER — DONEPEZIL HCL 10 MG PO TABS
10.0000 mg | ORAL_TABLET | Freq: Every day | ORAL | 0 refills | Status: DC
  Filled 2020-12-31: qty 90, 90d supply, fill #0

## 2020-12-31 MED ORDER — MONTELUKAST SODIUM 10 MG PO TABS
10.0000 mg | ORAL_TABLET | Freq: Every evening | ORAL | 0 refills | Status: DC
  Filled 2020-12-31: qty 90, 90d supply, fill #0

## 2021-01-01 ENCOUNTER — Other Ambulatory Visit (HOSPITAL_COMMUNITY): Payer: Self-pay

## 2021-01-10 ENCOUNTER — Other Ambulatory Visit (HOSPITAL_COMMUNITY): Payer: Self-pay

## 2021-01-10 MED ORDER — ZOLPIDEM TARTRATE 5 MG PO TABS
5.0000 mg | ORAL_TABLET | Freq: Every day | ORAL | 0 refills | Status: DC
  Filled 2021-01-10: qty 30, 30d supply, fill #0

## 2021-01-11 ENCOUNTER — Other Ambulatory Visit (HOSPITAL_COMMUNITY): Payer: Self-pay

## 2021-01-11 MED ORDER — ROSUVASTATIN CALCIUM 10 MG PO TABS
ORAL_TABLET | ORAL | 0 refills | Status: DC
  Filled 2021-01-11: qty 90, 90d supply, fill #0

## 2021-01-18 ENCOUNTER — Other Ambulatory Visit (HOSPITAL_COMMUNITY): Payer: Self-pay

## 2021-02-06 ENCOUNTER — Other Ambulatory Visit (HOSPITAL_COMMUNITY): Payer: Self-pay

## 2021-02-08 ENCOUNTER — Other Ambulatory Visit (HOSPITAL_COMMUNITY): Payer: Self-pay

## 2021-02-08 ENCOUNTER — Encounter: Payer: Self-pay | Admitting: Cardiology

## 2021-02-08 DIAGNOSIS — F32A Depression, unspecified: Secondary | ICD-10-CM | POA: Insufficient documentation

## 2021-02-08 DIAGNOSIS — M159 Polyosteoarthritis, unspecified: Secondary | ICD-10-CM | POA: Diagnosis not present

## 2021-02-08 DIAGNOSIS — E079 Disorder of thyroid, unspecified: Secondary | ICD-10-CM | POA: Insufficient documentation

## 2021-02-08 DIAGNOSIS — J45909 Unspecified asthma, uncomplicated: Secondary | ICD-10-CM | POA: Insufficient documentation

## 2021-02-08 DIAGNOSIS — R079 Chest pain, unspecified: Secondary | ICD-10-CM | POA: Diagnosis not present

## 2021-02-08 DIAGNOSIS — G4700 Insomnia, unspecified: Secondary | ICD-10-CM | POA: Diagnosis not present

## 2021-02-08 DIAGNOSIS — M7989 Other specified soft tissue disorders: Secondary | ICD-10-CM | POA: Diagnosis not present

## 2021-02-08 DIAGNOSIS — K219 Gastro-esophageal reflux disease without esophagitis: Secondary | ICD-10-CM | POA: Insufficient documentation

## 2021-02-08 MED ORDER — NITROGLYCERIN 0.4 MG SL SUBL
SUBLINGUAL_TABLET | SUBLINGUAL | 0 refills | Status: AC
  Filled 2021-02-08: qty 25, 10d supply, fill #0

## 2021-02-08 MED ORDER — ZOLPIDEM TARTRATE 10 MG PO TABS
10.0000 mg | ORAL_TABLET | Freq: Every day | ORAL | 0 refills | Status: DC
  Filled 2021-02-08: qty 30, 30d supply, fill #0

## 2021-02-08 MED ORDER — PREGABALIN 100 MG PO CAPS
100.0000 mg | ORAL_CAPSULE | Freq: Two times a day (BID) | ORAL | 0 refills | Status: DC | PRN
  Filled 2021-02-08: qty 60, 30d supply, fill #0

## 2021-02-09 ENCOUNTER — Other Ambulatory Visit (HOSPITAL_COMMUNITY): Payer: Self-pay

## 2021-02-12 ENCOUNTER — Encounter: Payer: Self-pay | Admitting: Cardiology

## 2021-02-12 ENCOUNTER — Telehealth: Payer: Self-pay

## 2021-02-12 ENCOUNTER — Other Ambulatory Visit: Payer: Self-pay

## 2021-02-12 ENCOUNTER — Ambulatory Visit (INDEPENDENT_AMBULATORY_CARE_PROVIDER_SITE_OTHER): Payer: Medicare HMO | Admitting: Cardiology

## 2021-02-12 ENCOUNTER — Other Ambulatory Visit (HOSPITAL_COMMUNITY): Payer: Self-pay

## 2021-02-12 VITALS — BP 118/70 | HR 75 | Ht 62.0 in | Wt 265.0 lb

## 2021-02-12 DIAGNOSIS — I209 Angina pectoris, unspecified: Secondary | ICD-10-CM

## 2021-02-12 DIAGNOSIS — M5136 Other intervertebral disc degeneration, lumbar region: Secondary | ICD-10-CM | POA: Diagnosis not present

## 2021-02-12 HISTORY — DX: Angina pectoris, unspecified: I20.9

## 2021-02-12 HISTORY — DX: Morbid (severe) obesity due to excess calories: E66.01

## 2021-02-12 MED ORDER — ASPIRIN EC 81 MG PO TBEC: 81.0000 mg | DELAYED_RELEASE_TABLET | Freq: Every day | ORAL | 3 refills | Status: DC

## 2021-02-12 NOTE — Telephone Encounter (Signed)
PA for cath is [id number] 02/13/21 expires 03/15/21 for [facility] Case # [id number]

## 2021-02-12 NOTE — H&P (View-Only) (Signed)
Cardiology Office Note:    Date:  02/12/2021   ID:  Kelly Hansen, DOB 21-Sep-1965, MRN [id number]  PCP:  Lucianne Lei, MD  Cardiologist:  Garwin Brothers, MD   Referring MD: Lucianne Lei, MD    ASSESSMENT:    1. Degeneration of lumbar intervertebral disc   2. Angina pectoris (HCC)   3. Morbid obesity (HCC)    PLAN:    In order of problems listed above:  1. Primary prevention stressed with the patient.  Importance of compliance with diet medication stressed and she vocalized understanding. 2. Angina pectoris:In view of the patient's symptoms, I discussed with the patient options for evaluation. Invasive and noninvasive options were given to the patient. I discussed stress testing and coronary angiography and left heart catheterization at length. Benefits, pros and cons of each approach were discussed at length. Patient had multiple questions which were answered to the patient's satisfaction. Patient opted for invasive evaluation and we will set up for coronary angiography and left heart catheterization. Further recommendations will be made based on the findings with coronary angiography. In the interim if the patient has any significant symptoms in hospital to the nearest emergency room.  I discussed with patient also CT coronary angiography but she is keen on evaluation with invasive angiography and wants it to done quickly.  She knows to go to the nearest emergency room for any concerning symptoms.  Sublingual nitroglycerin prescription was sent, its protocol and 911 protocol explained and the patient vocalized understanding questions were answered to the patient's satisfaction 3. Morbid obesity: Weight reduction was stressed.  Diet emphasized.  Risks of obesity discussed with her at length.  She vocalized understanding.  Questions were answered to her satisfaction.   Medication Adjustments/Labs and Tests Ordered: Current medicines are reviewed at length with the patient today.   Concerns regarding medicines are outlined above.  Orders Placed This Encounter  Procedures  . EKG 12-Lead   No orders of the defined types were placed in this encounter.    History of Present Illness:    Kelly Hansen is a 55 y.o. female who is being seen today for the evaluation of chest pain on exertion and paroxysmal nocturnal dyspnea at the request of Lucianne Lei, MD.  Patient is a pleasant 55 year old female.  She has no significant past medical history.  Patient mentions to me that in the past month or so whenever she walks to her mailbox she has to she will stop because of chest tightness radiating to the neck.  This has been affecting her quality of life.  This is concerning to her.  She also tells me that she when she lies down she has to get up because of orthopnea.  Occasionally she has had to get up in the night for some shortness of breath but this happened several weeks ago.  At the time of my evaluation, the patient is alert awake oriented and in no distress.  No radiation of the symptoms to the neck or to the arms.  Past Medical History:  Diagnosis Date  . Acid reflux   . Asthma   . CIN III with severe dysplasia 10/06/2016   Formatting of this note might be different from the original. Reports [facility] between 1991-2004 for abnormal Pap followed by Pap q87mos then q7mos She then had TVH in 2004 with Dr. Thamas Jaegers Needs Pap smears for 20-25y post-hysterectomy due to presumed h/o CIN 2-3  . Degeneration of lumbar intervertebral disc 03/30/2020  .  Depression   . Increased body mass index 03/30/2020  . Thyroid disease     Past Surgical History:  Procedure Laterality Date  . ABDOMINAL HYSTERECTOMY    . APPENDECTOMY      Current Medications: Current Meds  Medication Sig  . albuterol (VENTOLIN HFA) 108 (90 Base) MCG/ACT inhaler Inhale 2 puffs by mouth into the lungs 3 times a day as needed  . baclofen (LIORESAL) 20 MG tablet Take 20 mg by mouth 2 (two) times daily as needed for  mild pain.  Marland Kitchen diclofenac (VOLTAREN) 75 MG EC tablet TAKE 1 TABLET BY MOUTH EVERY DAY AS NEEDED FOR PAIN. TAKE WITH FOOD  . donepezil (ARICEPT) 10 MG tablet Take 10 mg by mouth daily.  . DULoxetine (CYMBALTA) 60 MG capsule Take 60 mg by mouth daily.  . ergocalciferol (VITAMIN D2) 1.25 MG (50000 UT) capsule Take 1 capsule by mouth once a week  . Fluticasone-Salmeterol (ADVAIR) 250-50 MCG/DOSE AEPB Inhale 2 puffs by mouth into the lungs 2 times a day  . furosemide (LASIX) 20 MG tablet Take 40 mg by mouth 2 (two) times daily.   Marland Kitchen gabapentin (NEURONTIN) 300 MG capsule Take 600 mg by mouth 3 (three) times daily.   Marland Kitchen levothyroxine (SYNTHROID) 25 MCG tablet Take 50 mcg by mouth daily.  . memantine (NAMENDA) 10 MG tablet Take 10 mg by mouth daily. Taking it once a day  . montelukast (SINGULAIR) 10 MG tablet Take 10 mg by mouth at bedtime.  . nitroGLYCERIN (NITROSTAT) 0.4 MG SL tablet Take 1 (one) Tablet as needed for chest pain-If not better in 5 minutes repeat x 2--If still not better--seek attention at ER/ Call 911  . oxybutynin (DITROPAN-XL) 10 MG 24 hr tablet Take 10 mg by mouth at bedtime.  . pantoprazole (PROTONIX) 40 MG tablet Take 40 mg by mouth daily.  . potassium chloride (KLOR-CON) 10 MEQ tablet Take 10 mEq by mouth 2 (two) times daily.  . pramipexole (MIRAPEX) 0.125 MG tablet Take 0.125 mg by mouth 2 (two) times daily.  . pregabalin (LYRICA) 100 MG capsule Take 1 capsule by mouth 2 times daily as needed  . pregabalin (LYRICA) 100 MG capsule Take 1 capsule (100 mg total) by mouth 2 (two) times daily as needed.  . rosuvastatin (CRESTOR) 10 MG tablet Take 10 mg by mouth daily.  . traZODone (DESYREL) 50 MG tablet Take 50 mg by mouth at bedtime. Take 1/2 tablet every night  . zolpidem (AMBIEN) 10 MG tablet Take 1 tablet by mouth at bedtime  . zolpidem (AMBIEN) 5 MG tablet Take 1 tablet by mouth at bedtime     Allergies:   Penicillins and Sulfa antibiotics   Social History   Socioeconomic  History  . Marital status: Married    Spouse name: Not on file  . Number of children: Not on file  . Years of education: Not on file  . Highest education level: Not on file  Occupational History  . Not on file  Tobacco Use  . Smoking status: Former Smoker    Quit date: 03/10/2011    Years since quitting: 9.9  . Smokeless tobacco: Never Used  Substance and Sexual Activity  . Alcohol use: No  . Drug use: No  . Sexual activity: Not on file  Other Topics Concern  . Not on file  Social History Narrative   Right handed   Lives with family    One story home few steps to get into home  Social Determinants of Health   Financial Resource Strain: Not on file  Food Insecurity: Not on file  Transportation Needs: Not on file  Physical Activity: Not on file  Stress: Not on file  Social Connections: Not on file     Family History: The patient's family history includes Dementia in her maternal aunt, maternal uncle, mother, and sister.  ROS:   Please see the history of present illness.    All other systems reviewed and are negative.  EKGs/Labs/Other Studies Reviewed:    The following studies were reviewed today: EKG reveals sinus rhythm and was unremarkable   Recent Labs: No results found for requested labs within last 8760 hours.  Recent Lipid Panel No results found for: CHOL, TRIG, HDL, CHOLHDL, VLDL, LDLCALC, LDLDIRECT  Physical Exam:    VS:  BP 118/70   Pulse 75   Ht 5\' 2"  (1.575 m)   Wt 265 lb (120.2 kg)   SpO2 95%   BMI 48.47 kg/m     Wt Readings from Last 3 Encounters:  02/12/21 265 lb (120.2 kg)  05/08/20 263 lb (119.3 kg)  11/29/19 251 lb 9.6 oz (114.1 kg)     GEN: Patient is in no acute distress HEENT: Normal NECK: No JVD; No carotid bruits LYMPHATICS: No lymphadenopathy CARDIAC: S1 S2 regular, 2/6 systolic murmur at the apex. RESPIRATORY:  Clear to auscultation without rales, wheezing or rhonchi  ABDOMEN: Soft, non-tender,  non-distended MUSCULOSKELETAL:  No edema; No deformity  SKIN: Warm and dry NEUROLOGIC:  Alert and oriented x 3 PSYCHIATRIC:  Normal affect    Signed, 12/01/19, MD  02/12/2021 11:23 AM    [facility]

## 2021-02-12 NOTE — Progress Notes (Signed)
Cardiology Office Note:    Date:  02/12/2021   ID:  Kelly Hansen, DOB 21-Sep-1965, MRN [id number]  PCP:  Lucianne Lei, MD  Cardiologist:  Garwin Brothers, MD   Referring MD: Lucianne Lei, MD    ASSESSMENT:    1. Degeneration of lumbar intervertebral disc   2. Angina pectoris (HCC)   3. Morbid obesity (HCC)    PLAN:    In order of problems listed above:  1. Primary prevention stressed with the patient.  Importance of compliance with diet medication stressed and she vocalized understanding. 2. Angina pectoris:In view of the patient's symptoms, I discussed with the patient options for evaluation. Invasive and noninvasive options were given to the patient. I discussed stress testing and coronary angiography and left heart catheterization at length. Benefits, pros and cons of each approach were discussed at length. Patient had multiple questions which were answered to the patient's satisfaction. Patient opted for invasive evaluation and we will set up for coronary angiography and left heart catheterization. Further recommendations will be made based on the findings with coronary angiography. In the interim if the patient has any significant symptoms in hospital to the nearest emergency room.  I discussed with patient also CT coronary angiography but she is keen on evaluation with invasive angiography and wants it to done quickly.  She knows to go to the nearest emergency room for any concerning symptoms.  Sublingual nitroglycerin prescription was sent, its protocol and 911 protocol explained and the patient vocalized understanding questions were answered to the patient's satisfaction 3. Morbid obesity: Weight reduction was stressed.  Diet emphasized.  Risks of obesity discussed with her at length.  She vocalized understanding.  Questions were answered to her satisfaction.   Medication Adjustments/Labs and Tests Ordered: Current medicines are reviewed at length with the patient today.   Concerns regarding medicines are outlined above.  Orders Placed This Encounter  Procedures  . EKG 12-Lead   No orders of the defined types were placed in this encounter.    History of Present Illness:    Kelly Hansen is a 55 y.o. female who is being seen today for the evaluation of chest pain on exertion and paroxysmal nocturnal dyspnea at the request of Lucianne Lei, MD.  Patient is a pleasant 55 year old female.  She has no significant past medical history.  Patient mentions to me that in the past month or so whenever she walks to her mailbox she has to she will stop because of chest tightness radiating to the neck.  This has been affecting her quality of life.  This is concerning to her.  She also tells me that she when she lies down she has to get up because of orthopnea.  Occasionally she has had to get up in the night for some shortness of breath but this happened several weeks ago.  At the time of my evaluation, the patient is alert awake oriented and in no distress.  No radiation of the symptoms to the neck or to the arms.  Past Medical History:  Diagnosis Date  . Acid reflux   . Asthma   . CIN III with severe dysplasia 10/06/2016   Formatting of this note might be different from the original. Reports [facility] between 1991-2004 for abnormal Pap followed by Pap q87mos then q7mos She then had TVH in 2004 with Dr. Thamas Jaegers Needs Pap smears for 20-25y post-hysterectomy due to presumed h/o CIN 2-3  . Degeneration of lumbar intervertebral disc 03/30/2020  .  Depression   . Increased body mass index 03/30/2020  . Thyroid disease     Past Surgical History:  Procedure Laterality Date  . ABDOMINAL HYSTERECTOMY    . APPENDECTOMY      Current Medications: Current Meds  Medication Sig  . albuterol (VENTOLIN HFA) 108 (90 Base) MCG/ACT inhaler Inhale 2 puffs by mouth into the lungs 3 times a day as needed  . baclofen (LIORESAL) 20 MG tablet Take 20 mg by mouth 2 (two) times daily as needed for  mild pain.  Marland Kitchen diclofenac (VOLTAREN) 75 MG EC tablet TAKE 1 TABLET BY MOUTH EVERY DAY AS NEEDED FOR PAIN. TAKE WITH FOOD  . donepezil (ARICEPT) 10 MG tablet Take 10 mg by mouth daily.  . DULoxetine (CYMBALTA) 60 MG capsule Take 60 mg by mouth daily.  . ergocalciferol (VITAMIN D2) 1.25 MG (50000 UT) capsule Take 1 capsule by mouth once a week  . Fluticasone-Salmeterol (ADVAIR) 250-50 MCG/DOSE AEPB Inhale 2 puffs by mouth into the lungs 2 times a day  . furosemide (LASIX) 20 MG tablet Take 40 mg by mouth 2 (two) times daily.   Marland Kitchen gabapentin (NEURONTIN) 300 MG capsule Take 600 mg by mouth 3 (three) times daily.   Marland Kitchen levothyroxine (SYNTHROID) 25 MCG tablet Take 50 mcg by mouth daily.  . memantine (NAMENDA) 10 MG tablet Take 10 mg by mouth daily. Taking it once a day  . montelukast (SINGULAIR) 10 MG tablet Take 10 mg by mouth at bedtime.  . nitroGLYCERIN (NITROSTAT) 0.4 MG SL tablet Take 1 (one) Tablet as needed for chest pain-If not better in 5 minutes repeat x 2--If still not better--seek attention at ER/ Call 911  . oxybutynin (DITROPAN-XL) 10 MG 24 hr tablet Take 10 mg by mouth at bedtime.  . pantoprazole (PROTONIX) 40 MG tablet Take 40 mg by mouth daily.  . potassium chloride (KLOR-CON) 10 MEQ tablet Take 10 mEq by mouth 2 (two) times daily.  . pramipexole (MIRAPEX) 0.125 MG tablet Take 0.125 mg by mouth 2 (two) times daily.  . pregabalin (LYRICA) 100 MG capsule Take 1 capsule by mouth 2 times daily as needed  . pregabalin (LYRICA) 100 MG capsule Take 1 capsule (100 mg total) by mouth 2 (two) times daily as needed.  . rosuvastatin (CRESTOR) 10 MG tablet Take 10 mg by mouth daily.  . traZODone (DESYREL) 50 MG tablet Take 50 mg by mouth at bedtime. Take 1/2 tablet every night  . zolpidem (AMBIEN) 10 MG tablet Take 1 tablet by mouth at bedtime  . zolpidem (AMBIEN) 5 MG tablet Take 1 tablet by mouth at bedtime     Allergies:   Penicillins and Sulfa antibiotics   Social History   Socioeconomic  History  . Marital status: Married    Spouse name: Not on file  . Number of children: Not on file  . Years of education: Not on file  . Highest education level: Not on file  Occupational History  . Not on file  Tobacco Use  . Smoking status: Former Smoker    Quit date: 03/10/2011    Years since quitting: 9.9  . Smokeless tobacco: Never Used  Substance and Sexual Activity  . Alcohol use: No  . Drug use: No  . Sexual activity: Not on file  Other Topics Concern  . Not on file  Social History Narrative   Right handed   Lives with family    One story home few steps to get into home  Social Determinants of Health   Financial Resource Strain: Not on file  Food Insecurity: Not on file  Transportation Needs: Not on file  Physical Activity: Not on file  Stress: Not on file  Social Connections: Not on file     Family History: The patient's family history includes Dementia in her maternal aunt, maternal uncle, mother, and sister.  ROS:   Please see the history of present illness.    All other systems reviewed and are negative.  EKGs/Labs/Other Studies Reviewed:    The following studies were reviewed today: EKG reveals sinus rhythm and was unremarkable   Recent Labs: No results found for requested labs within last 8760 hours.  Recent Lipid Panel No results found for: CHOL, TRIG, HDL, CHOLHDL, VLDL, LDLCALC, LDLDIRECT  Physical Exam:    VS:  BP 118/70   Pulse 75   Ht 5\' 2"  (1.575 m)   Wt 265 lb (120.2 kg)   SpO2 95%   BMI 48.47 kg/m     Wt Readings from Last 3 Encounters:  02/12/21 265 lb (120.2 kg)  05/08/20 263 lb (119.3 kg)  11/29/19 251 lb 9.6 oz (114.1 kg)     GEN: Patient is in no acute distress HEENT: Normal NECK: No JVD; No carotid bruits LYMPHATICS: No lymphadenopathy CARDIAC: S1 S2 regular, 2/6 systolic murmur at the apex. RESPIRATORY:  Clear to auscultation without rales, wheezing or rhonchi  ABDOMEN: Soft, non-tender,  non-distended MUSCULOSKELETAL:  No edema; No deformity  SKIN: Warm and dry NEUROLOGIC:  Alert and oriented x 3 PSYCHIATRIC:  Normal affect    Signed, 12/01/19, MD  02/12/2021 11:23 AM    [facility]

## 2021-02-12 NOTE — Pre-Procedure Instructions (Signed)
Pt contacted pre-catheterization scheduled at [facility] for: Heart Catheterization Verified arrival time and place: [facility] Main Entrance [facility]) at:9:00  No solid food after midnight prior to cath, clear liquids until 5 AM day of procedure. CONTRAST ALLERGY: No allergy to dye  AM meds can be  taken pre-cath with sips of water including: ASA 81 mg  Do not take Lasix tomorrow AM   Confirmed patient has responsible adult to drive home post procedure and be with patient first 24 hours after arriving home:  You are allowed ONE visitor in the waiting room during the time you are at the hospital for your procedure. Both you and your visitor must wear a mask once you enter the hospital.   Patient reports does not currently have any symptoms concerning for COVID-19 and no household members with COVID-19 like illness.   No symptoms of Covid

## 2021-02-12 NOTE — Patient Instructions (Signed)
Medication Instructions:  Your physician has recommended you make the following change in your medication:   Take 81 mg coated aspirin daily. Use nitroglycerin as needed for chest pain.  *If you need a refill on your cardiac medications before your next appointment, please call your pharmacy*   Lab Work: Your physician recommends that you have a BMET and CBC today in the office for your upcoming procedure.  If you have labs (blood work) drawn today and your tests are completely normal, you will receive your results only by: Marland Kitchen MyChart Message (if you have MyChart) OR . A paper copy in the mail If you have any lab test that is abnormal or we need to change your treatment, we will call you to review the results.   Testing/Procedures:    [facility] CARDIOVASCULAR DIVISION [facility] [place] [address] [place] [address] Dept: [phone] Loc: [phone]  Kelly Hansen  02/12/2021  You are scheduled for a Cardiac Catheterization on Wednesday, June 1 with Dr. Tonny Bollman.  1. Please arrive at the [facility] (Main Entrance A) at [facility]: [address] [place], [place] [address] at 6:30 AM (This time is two hours before your procedure to ensure your preparation). Free valet parking service is available.   Special note: Every effort is made to have your procedure done on time. Please understand that emergencies sometimes delay scheduled procedures.  2. Diet: Do not eat solid foods after midnight.  The patient may have clear liquids until 5am upon the day of the procedure.  3. Labs: Your labs were done in the office today.  4. Medication instructions in preparation for your procedure:   Contrast Allergy: No  *For reference purposes while preparing patient instructions.   Delete this med list prior to printing instructions for patient.*   Stop taking, Lasix (Furosemide)  Wednesday, June 1, and Voltaren  June 1.      On the morning of your procedure, take your Aspirin and any morning medicines NOT listed above.  You may use sips of water.  5. Plan for one night stay--bring personal belongings. 6. Bring a current list of your medications and current insurance cards. 7. You MUST have a responsible person to drive you home. 8. Someone MUST be with you the first 24 hours after you arrive home or your discharge will be delayed. 9. Please wear clothes that are easy to get on and off and wear slip-on shoes.  Thank you for allowing [place] to care for you!   -- [facility]    Follow-Up: At [facility], you and your health needs are our priority.  As part of our continuing mission to provide you with exceptional heart care, we have created designated Provider Care Teams.  These Care Teams include your primary Cardiologist (physician) and Advanced Practice Providers (APPs -  Physician Assistants and Nurse Practitioners) who all work together to provide you with the care you need, when you need it.  We recommend signing up for the patient portal called "MyChart".  Sign up information is provided on this After Visit Summary.  MyChart is used to connect with patients for Virtual Visits (Telemedicine).  Patients are able to view lab/test results, encounter notes, upcoming appointments, etc.  Non-urgent messages can be sent to your provider as well.   To learn more about what you can do with MyChart, go to [email or web address].    Your next  appointment:   2 month(s)  The format for your next appointment:   In Person  Provider:   Belva Crome, MD   Other Instructions  Coronary Angiogram With Stent Coronary angiogram with stent placement is a procedure to widen or open a narrow blood vessel of the heart (coronary artery). Arteries may become blocked by cholesterol buildup (plaques) in the lining of the artery wall. When a coronary artery becomes partially  blocked, blood flow to that area decreases. This may lead to chest pain or a heart attack (myocardial infarction). A stent is a small piece of metal that looks like mesh or spring. Stent placement may be done as treatment after a heart attack, or to prevent a heart attack if a blocked artery is found by a coronary angiogram. Let your health care provider know about:  Any allergies you have, including allergies to medicines or contrast dye.  All medicines you are taking, including vitamins, herbs, eye drops, creams, and over-the-counter medicines.  Any problems you or family members have had with anesthetic medicines.  Any blood disorders you have.  Any surgeries you have had.  Any medical conditions you have, including kidney problems or kidney failure.  Whether you are pregnant or may be pregnant.  Whether you are breastfeeding. What are the risks? Generally, this is a safe procedure. However, serious problems may occur, including: 1. Damage to nearby structures or organs, such as the heart, blood vessels, or kidneys. 2. A return of blockage. 3. Bleeding, infection, or bruising at the insertion site. 4. A collection of blood under the skin (hematoma) at the insertion site. 5. A blood clot in another part of the body. 6. Allergic reaction to medicines or dyes. 7. Bleeding into the abdomen (retroperitoneal bleeding). 8. Stroke (rare). 9. Heart attack (rare). What happens before the procedure? Staying hydrated Follow instructions from your health care provider about hydration, which may include: 1. Up to 2 hours before the procedure - you may continue to drink clear liquids, such as water, clear fruit juice, black coffee, and plain tea.    Eating and drinking restrictions Follow instructions from your health care provider about eating and drinking, which may include: 1. 8 hours before the procedure - stop eating heavy meals or foods, such as meat, fried foods, or fatty foods. 2. 6  hours before the procedure - stop eating light meals or foods, such as toast or cereal. 3. 2 hours before the procedure - stop drinking clear liquids. Medicines Ask your health care provider about: 1. Changing or stopping your regular medicines. This is especially important if you are taking diabetes medicines or blood thinners. 2. Taking medicines such as aspirin and ibuprofen. These medicines can thin your blood. Do not take these medicines unless your health care provider tells you to take them. ? Generally, aspirin is recommended before a thin tube, called a catheter, is passed through a blood vessel and inserted into the heart (cardiac catheterization). 3. Taking over-the-counter medicines, vitamins, herbs, and supplements. General instructions 1. Do not use any products that contain nicotine or tobacco for at least 4 weeks before the procedure. These products include cigarettes, e-cigarettes, and chewing tobacco. If you need help quitting, ask your health care provider. 2. Plan to have someone take you home from the hospital or clinic. 3. If you will be going home right after the procedure, plan to have someone with you for 24 hours. 4. You may have tests and imaging procedures. 5. Ask your health care  provider: 1. How your insertion site will be marked. Ask which artery will be used for the procedure. 2. What steps will be taken to help prevent infection. These may include:  Removing hair at the insertion site.  Washing skin with a germ-killing soap.  Taking antibiotic medicine. What happens during the procedure? 1. An IV will be inserted into one of your veins. 2. Electrodes may be placed on your chest to monitor your heart rate during the procedure. 3. You will be given one or more of the following: ? A medicine to help you relax (sedative). ? A medicine to numb the area (local anesthetic) for catheter insertion. 4. A small incision will be made for catheter insertion. 5. The  catheter will be inserted into an artery using a guide wire. The location may be in your groin, your wrist, or the fold of your arm (near your elbow). 6. An X-ray procedure (fluoroscopy) will be used to help guide the catheter to the opening of the heart arteries. 7. A dye will be injected into the catheter. X-rays will be taken. The dye helps to show where any narrowing or blockages are located in the arteries. 8. Tell your health care provider if you have chest pain or trouble breathing. 9. A tiny wire will be guided to the blocked spot, and a balloon will be inflated to make the artery wider. 10. The stent will be expanded to crush the plaques into the wall of the vessel. The stent will hold the area open and improve the blood flow. Most stents have a drug coating to reduce the risk of the stent narrowing over time. 11. The artery may be made wider using a drill, laser, or other tools that remove plaques. 12. The catheter will be removed when the blood flow improves. The stent will stay where it was placed, and the lining of the artery will grow over it. 13. A bandage (dressing) will be placed on the insertion site. Pressure will be applied to stop bleeding. 14. The IV will be removed. This procedure may vary among health care providers and hospitals.    What happens after the procedure?  Your blood pressure, heart rate, breathing rate, and blood oxygen level will be monitored until you leave the hospital or clinic.  If the procedure is done through the leg, you will lie flat in bed for a few hours or for as long as told by your health care provider. You will be instructed not to bend or cross your legs.  The insertion site and the pulse in your foot or wrist will be checked often.  You may have more blood tests, X-rays, and a test that records the electrical activity of your heart (electrocardiogram, or ECG).  Do not drive for 24 hours if you were given a sedative during your  procedure. Summary  Coronary angiogram with stent placement is a procedure to widen or open a narrowed coronary artery. This is done to treat heart problems.  Before the procedure, let your health care provider know about all the medical conditions and surgeries you have or have had.  This is a safe procedure. However, some problems may occur, including damage to nearby structures or organs, bleeding, blood clots, or allergies.  Follow your health care provider's instructions about eating, drinking, medicines, and other lifestyle changes, such as quitting tobacco use before the procedure. This information is not intended to replace advice given to you by your health care provider. Make sure you  discuss any questions you have with your health care provider. Document Revised: 03/23/2019 Document Reviewed: 03/23/2019 Elsevier Patient Education  2021 Elsevier Inc.  Aspirin and Your Heart Aspirin is a medicine that prevents the platelets in your blood from sticking together. Platelets are the cells that your blood uses for clotting. Aspirin can be used to help reduce the risk of blood clots, heart attacks, and other heart-related problems. What are the risks? Daily use of aspirin can cause side effects. Some of these include:  Bleeding. Bleeding can be minor or serious. An example of minor bleeding is bleeding from a cut, and the bleeding does not stop. An example of more serious bleeding is stomach bleeding or, rarely, bleeding into the brain. Your risk of bleeding increases if you are also taking NSAIDs, such as ibuprofen.  Increased bruising.  Upset stomach.  An allergic reaction. People who have growths inside the nose (nasal polyps) have an increased risk of developing an aspirin allergy. How to use aspirin to care for your heart 10. Take aspirin only as told by your health care provider. Make sure that you understand how much to take and what form to take. The two forms of aspirin  are: 1. Non-enteric-coated.This type of aspirin does not have a coating and is absorbed quickly. This type of aspirin also comes in a chewable form. 2. Enteric-coated. This type of aspirin has a coating that releases the medicine very slowly. Enteric-coated aspirin might cause less stomach upset than non-enteric-coated aspirin. This type of aspirin should not be chewed or crushed. 11. Work with your health care provider to find out whether it is safe and beneficial for you to take aspirin daily. Taking aspirin daily may be helpful if: 1. You have had a heart attack or chest pain, or you are at risk for a heart attack. 2. You have a condition in which certain heart vessels are blocked (coronary artery disease), and you have had a procedure to treat it. Examples are:  Open-heart surgery, such as coronary artery bypass surgery (CABG).  Coronary angioplasty,which is done to widen a blood vessel of your heart.  Having a small mesh tube, or stent, placed in your coronary artery. 3. You have had certain types of stroke or a mini-stroke known as a transient ischemic attack (TIA). 4. You have a narrowing of the arteries that supply the limbs (peripheral artery disease, or PAD). 5. You have long-term (chronic) heart rhythm problems, such as atrial fibrillation, and your health care provider thinks aspirin may help. 6. You have valve disease or have had surgery on a valve. 7. You are considered at increased risk of developing coronary artery disease or PAD.    Follow these instructions at home Medicines 2. Take over-the-counter and prescription medicines only as told by your health care provider. 3. If you are taking blood thinners: ? Talk with your health care provider before you take any medicines that contain aspirin or NSAIDs, such as ibuprofen. These medicines increase your risk for dangerous bleeding. ? Take your medicine exactly as told, at the same time every day. ? Avoid activities that could  cause injury or bruising, and follow instructions about how to prevent falls. ? Wear a medical alert bracelet or carry a card that lists what medicines you take. General instructions 4. Do not drink alcohol if: 1. Your health care provider tells you not to drink. 2. You are pregnant, may be pregnant, or are planning to become pregnant. 5. If you drink alcohol:  1. Limit how much you use to:  0-1 drink a day for women.  0-2 drinks a day for men. 2. Be aware of how much alcohol is in your drink. In the [place] one drink equals one 12 oz bottle of beer (355 mL), one 5 oz glass of wine (148 mL), or one 1 oz glass of hard liquor (44 mL). 6. Keep all follow-up visits as told by your health care provider. This is important. Where to find more information 4. The American Heart Association: www.heart.org Contact a health care provider if you have: 6. Unusual bleeding or bruising. 7. Stomach pain or nausea. 8. Ringing in your ears. 9. An allergic reaction that causes hives, itchy skin, or swelling of the lips, tongue, or face. Get help right away if: 15. You notice that your bowel movements are bloody, or dark red or black in color. 16. You vomit or cough up blood. 17. You have blood in your urine. 18. You cough, breathe loudly (wheeze), or feel short of breath. 19. You have chest pain, especially if the pain spreads to your arms, back, neck, or jaw. 20. You have a headache with confusion. You have any symptoms of a stroke. "BE FAST" is an easy way to remember the main warning signs of a stroke:  B - Balance. Signs are dizziness, sudden trouble walking, or loss of balance.  E - Eyes. Signs are trouble seeing or a sudden change in vision.  F - Face. Signs are sudden weakness or numbness of the face, or the face or eyelid drooping on one side.  A - Arms. Signs are weakness or numbness in an arm. This happens suddenly and usually on one side of the body.  S - Speech. Signs are sudden trouble  speaking, slurred speech, or trouble understanding what people say.  T - Time. Time to call emergency services. Write down what time symptoms started. You have other signs of a stroke, such as:  A sudden, severe headache with no known cause.  Nausea or vomiting.  Seizure. These symptoms may represent a serious problem that is an emergency. Do not wait to see if the symptoms will go away. Get medical help right away. Call your local emergency services (911 in the [place] Do not drive yourself to the hospital. Summary  Aspirin use can help reduce the risk of blood clots, heart attacks, and other heart-related problems.  Daily use of aspirin can cause side effects.  Take aspirin only as told by your health care provider. Make sure that you understand how much to take and what form to take.  Your health care provider will help you determine whether it is safe and beneficial for you to take aspirin daily. This information is not intended to replace advice given to you by your health care provider. Make sure you discuss any questions you have with your health care provider. Document Revised: 06/06/2019 Document Reviewed: 06/06/2019 Elsevier Patient Education  2021 Elsevier Inc. Nitroglycerin sublingual tablets What is this medicine? NITROGLYCERIN (nye troe GLI ser in) is a type of vasodilator. It relaxes blood vessels, increasing the blood and oxygen supply to your heart. This medicine is used to relieve chest pain caused by angina. It is also used to prevent chest pain before activities like climbing stairs, going outdoors in cold weather, or sexual activity. This medicine may be used for other purposes; ask your health care provider or pharmacist if you have questions. COMMON BRAND NAME(S): Nitroquick, Nitrostat, Nitrotab What should  I tell my health care provider before I take this medicine? They need to know if you have any of these conditions:  anemia  head injury, recent stroke, or  bleeding in the brain  liver disease  previous heart attack  an unusual or allergic reaction to nitroglycerin, other medicines, foods, dyes, or preservatives  pregnant or trying to get pregnant  breast-feeding How should I use this medicine? Take this medicine by mouth as needed. Use at the first sign of an angina attack (chest pain or tightness). You can also take this medicine 5 to 10 minutes before an event likely to produce chest pain. Follow the directions exactly as written on the prescription label. Place one tablet under your tongue and let it dissolve. Do not swallow whole. Replace the dose if you accidentally swallow it. It will help if your mouth is not dry. Saliva around the tablet will help it to dissolve more quickly. Do not eat or drink, smoke or chew tobacco while a tablet is dissolving. Sit down when taking this medicine. In an angina attack, you should feel better within 5 minutes after your first dose. You can take a dose every 5 minutes up to a total of 3 doses. If you do not feel better or feel worse after 1 dose, call 9-1-1 at once. Do not take more than 3 doses in 15 minutes. Your health care provider might give you other directions. Follow those directions if he or she does. Do not take your medicine more often than directed. Talk to your health care provider about the use of this medicine in children. Special care may be needed. Overdosage: If you think you have taken too much of this medicine contact a poison control center or emergency room at once. NOTE: This medicine is only for you. Do not share this medicine with others. What if I miss a dose? This does not apply. This medicine is only used as needed. What may interact with this medicine? Do not take this medicine with any of the following medications: 12. certain migraine medicines like ergotamine and dihydroergotamine (DHE) 13. medicines used to treat erectile dysfunction like sildenafil, tadalafil, and  vardenafil 14. riociguat This medicine may also interact with the following medications: 4. alteplase 5. aspirin 6. heparin 7. medicines for high blood pressure 8. medicines for mental depression 9. other medicines used to treat angina 10. phenothiazines like chlorpromazine, mesoridazine, prochlorperazine, thioridazine This list may not describe all possible interactions. Give your health care provider a list of all the medicines, herbs, non-prescription drugs, or dietary supplements you use. Also tell them if you smoke, drink alcohol, or use illegal drugs. Some items may interact with your medicine. What should I watch for while using this medicine? Tell your doctor or health care professional if you feel your medicine is no longer working. Keep this medicine with you at all times. Sit or lie down when you take your medicine to prevent falling if you feel dizzy or faint after using it. Try to remain calm. This will help you to feel better faster. If you feel dizzy, take several deep breaths and lie down with your feet propped up, or bend forward with your head resting between your knees. You may get drowsy or dizzy. Do not drive, use machinery, or do anything that needs mental alertness until you know how this drug affects you. Do not stand or sit up quickly, especially if you are an older patient. This reduces the risk of dizzy  or fainting spells. Alcohol can make you more drowsy and dizzy. Avoid alcoholic drinks. Do not treat yourself for coughs, colds, or pain while you are taking this medicine without asking your doctor or health care professional for advice. Some ingredients may increase your blood pressure. What side effects may I notice from receiving this medicine? Side effects that you should report to your doctor or health care professional as soon as possible: 7. allergic reactions (skin rash, itching or hives; swelling of the face, lips, or tongue) 8. low blood pressure (dizziness;  feeling faint or lightheaded, falls; unusually weak or tired) 9. low red blood cell counts (trouble breathing; feeling faint; lightheaded, falls; unusually weak or tired) Side effects that usually do not require medical attention (report to your doctor or health care professional if they continue or are bothersome): 5. facial flushing (redness) 6. headache 7. nausea, vomiting This list may not describe all possible side effects. Call your doctor for medical advice about side effects. You may report side effects to FDA at 1-800-FDA-1088. Where should I keep my medicine? Keep out of the reach of children. Store at room temperature between 20 and 25 degrees C (68 and 77 degrees F). Store in Retail buyer. Protect from light and moisture. Keep tightly closed. Throw away any unused medicine after the expiration date. NOTE: This sheet is a summary. It may not cover all possible information. If you have questions about this medicine, talk to your doctor, pharmacist, or health care provider.  2021 Elsevier/Gold Standard (2018-06-02 16:46:32)

## 2021-02-13 ENCOUNTER — Other Ambulatory Visit: Payer: Self-pay

## 2021-02-13 ENCOUNTER — Encounter (HOSPITAL_COMMUNITY): Payer: Self-pay | Admitting: Cardiovascular Disease

## 2021-02-13 ENCOUNTER — Ambulatory Visit (HOSPITAL_COMMUNITY)
Admission: RE | Admit: 2021-02-13 | Discharge: 2021-02-13 | Disposition: A | Discharge status: Home / Self Care | Payer: Medicare HMO | Source: Ambulatory Visit | Attending: Cardiovascular Disease | Admitting: Cardiovascular Disease

## 2021-02-13 ENCOUNTER — Encounter (HOSPITAL_COMMUNITY)
Admission: RE | Disposition: A | Discharge status: Home / Self Care | Payer: Self-pay | Source: Ambulatory Visit | Attending: Cardiovascular Disease

## 2021-02-13 DIAGNOSIS — Z79899 Other long term (current) drug therapy: Secondary | ICD-10-CM | POA: Diagnosis not present

## 2021-02-13 DIAGNOSIS — Z6841 Body Mass Index (BMI) 40.0 and over, adult: Secondary | ICD-10-CM | POA: Insufficient documentation

## 2021-02-13 DIAGNOSIS — Z7951 Long term (current) use of inhaled steroids: Secondary | ICD-10-CM | POA: Insufficient documentation

## 2021-02-13 DIAGNOSIS — M5136 Other intervertebral disc degeneration, lumbar region: Secondary | ICD-10-CM | POA: Diagnosis not present

## 2021-02-13 DIAGNOSIS — Z79890 Hormone replacement therapy (postmenopausal): Secondary | ICD-10-CM | POA: Insufficient documentation

## 2021-02-13 DIAGNOSIS — Z87891 Personal history of nicotine dependence: Secondary | ICD-10-CM | POA: Diagnosis not present

## 2021-02-13 DIAGNOSIS — Z882 Allergy status to sulfonamides status: Secondary | ICD-10-CM | POA: Diagnosis not present

## 2021-02-13 DIAGNOSIS — Z9049 Acquired absence of other specified parts of digestive tract: Secondary | ICD-10-CM | POA: Diagnosis not present

## 2021-02-13 DIAGNOSIS — I25119 Atherosclerotic heart disease of native coronary artery with unspecified angina pectoris: Secondary | ICD-10-CM | POA: Diagnosis not present

## 2021-02-13 DIAGNOSIS — I209 Angina pectoris, unspecified: Secondary | ICD-10-CM | POA: Diagnosis present

## 2021-02-13 DIAGNOSIS — Z880 Allergy status to penicillin: Secondary | ICD-10-CM | POA: Diagnosis not present

## 2021-02-13 DIAGNOSIS — E079 Disorder of thyroid, unspecified: Secondary | ICD-10-CM | POA: Insufficient documentation

## 2021-02-13 HISTORY — PX: LEFT HEART CATH AND CORONARY ANGIOGRAPHY: CATH118249

## 2021-02-13 LAB — CBC WITH DIFFERENTIAL/PLATELET
Basophils Absolute: 0.1 10*3/uL (ref 0.0–0.2)
Basos: 1 %
EOS (ABSOLUTE): 0.4 10*3/uL (ref 0.0–0.4)
Eos: 4 %
Hematocrit: 43.9 % (ref 34.0–46.6)
Hemoglobin: 14.5 g/dL (ref 11.1–15.9)
Immature Grans (Abs): 0 10*3/uL (ref 0.0–0.1)
Immature Granulocytes: 0 %
Lymphocytes Absolute: 3.2 10*3/uL — ABNORMAL HIGH (ref 0.7–3.1)
Lymphs: 38 %
MCH: 30.1 pg (ref 26.6–33.0)
MCHC: 33 g/dL (ref 31.5–35.7)
MCV: 91 fL (ref 79–97)
Monocytes Absolute: 0.9 10*3/uL (ref 0.1–0.9)
Monocytes: 11 %
Neutrophils Absolute: 3.9 10*3/uL (ref 1.4–7.0)
Neutrophils: 46 %
Platelets: 302 10*3/uL (ref 150–450)
RBC: 4.81 x10E6/uL (ref 3.77–5.28)
RDW: 13.3 % (ref 11.7–15.4)
WBC: 8.4 10*3/uL (ref 3.4–10.8)

## 2021-02-13 LAB — BASIC METABOLIC PANEL
BUN/Creatinine Ratio: 13 (ref 9–23)
BUN: 14 mg/dL (ref 6–24)
CO2: 24 mmol/L (ref 20–29)
Calcium: 9.7 mg/dL (ref 8.7–10.2)
Chloride: 100 mmol/L (ref 96–106)
Creatinine, Ser: 1.11 mg/dL — ABNORMAL HIGH (ref 0.57–1.00)
Glucose: 82 mg/dL (ref 65–99)
Potassium: 4.4 mmol/L (ref 3.5–5.2)
Sodium: 140 mmol/L (ref 134–144)
eGFR: 59 mL/min/{1.73_m2} — ABNORMAL LOW (ref >59)

## 2021-02-13 SURGERY — LEFT HEART CATH AND CORONARY ANGIOGRAPHY
Anesthesia: LOCAL

## 2021-02-13 MED ORDER — LABETALOL HCL 5 MG/ML IV SOLN: 10.0000 mg | INTRAVENOUS | Status: DC | PRN

## 2021-02-13 MED ORDER — SODIUM CHLORIDE 0.9 % WEIGHT BASED INFUSION: 1.0000 mL/kg/h | INTRAVENOUS | Status: DC

## 2021-02-13 MED ORDER — MIDAZOLAM HCL 2 MG/2ML IJ SOLN
INTRAMUSCULAR | Status: DC | PRN
  Administered 2021-02-13: 1 mg via INTRAVENOUS

## 2021-02-13 MED ORDER — LIDOCAINE HCL (PF) 1 % IJ SOLN
INTRAMUSCULAR | Status: AC
  Filled 2021-02-13: qty 30

## 2021-02-13 MED ORDER — SODIUM CHLORIDE 0.9% FLUSH: 3.0000 mL | INTRAVENOUS | Status: DC | PRN

## 2021-02-13 MED ORDER — ONDANSETRON HCL 4 MG/2ML IJ SOLN: 4.0000 mg | Freq: Four times a day (QID) | INTRAMUSCULAR | Status: DC | PRN

## 2021-02-13 MED ORDER — SODIUM CHLORIDE 0.9% FLUSH: 3.0000 mL | Freq: Two times a day (BID) | INTRAVENOUS | Status: DC

## 2021-02-13 MED ORDER — VERAPAMIL HCL 2.5 MG/ML IV SOLN
INTRAVENOUS | Status: DC | PRN
  Administered 2021-02-13: 10 mL via INTRA_ARTERIAL

## 2021-02-13 MED ORDER — SODIUM CHLORIDE 0.9 % WEIGHT BASED INFUSION
3.0000 mL/kg/h | INTRAVENOUS | Status: AC
  Administered 2021-02-13: 3 mL/kg/h via INTRAVENOUS

## 2021-02-13 MED ORDER — FENTANYL CITRATE (PF) 100 MCG/2ML IJ SOLN
INTRAMUSCULAR | Status: DC | PRN
  Administered 2021-02-13: 25 ug via INTRAVENOUS

## 2021-02-13 MED ORDER — HEPARIN SODIUM (PORCINE) 1000 UNIT/ML IJ SOLN
INTRAMUSCULAR | Status: DC | PRN
  Administered 2021-02-13: 5000 [IU] via INTRAVENOUS

## 2021-02-13 MED ORDER — HEPARIN (PORCINE) IN NACL 1000-0.9 UT/500ML-% IV SOLN
INTRAVENOUS | Status: AC
  Filled 2021-02-13: qty 1000

## 2021-02-13 MED ORDER — ACETAMINOPHEN 325 MG PO TABS: 650.0000 mg | ORAL_TABLET | ORAL | Status: DC | PRN

## 2021-02-13 MED ORDER — VERAPAMIL HCL 2.5 MG/ML IV SOLN
INTRAVENOUS | Status: AC
  Filled 2021-02-13: qty 2

## 2021-02-13 MED ORDER — SODIUM CHLORIDE 0.9 % IV SOLN: 250.0000 mL | INTRAVENOUS | Status: DC | PRN

## 2021-02-13 MED ORDER — HEPARIN SODIUM (PORCINE) 1000 UNIT/ML IJ SOLN
INTRAMUSCULAR | Status: AC
  Filled 2021-02-13: qty 1

## 2021-02-13 MED ORDER — HYDRALAZINE HCL 20 MG/ML IJ SOLN: 10.0000 mg | INTRAMUSCULAR | Status: DC | PRN

## 2021-02-13 MED ORDER — IOHEXOL 350 MG/ML SOLN
INTRAVENOUS | Status: DC | PRN
Start: 2021-02-13 — End: 2021-02-13
  Administered 2021-02-13: 45 mL via INTRA_ARTERIAL

## 2021-02-13 MED ORDER — MIDAZOLAM HCL 2 MG/2ML IJ SOLN
INTRAMUSCULAR | Status: AC
  Filled 2021-02-13: qty 2

## 2021-02-13 MED ORDER — LIDOCAINE HCL (PF) 1 % IJ SOLN
INTRAMUSCULAR | Status: DC | PRN
  Administered 2021-02-13: 2 mL

## 2021-02-13 MED ORDER — HEPARIN (PORCINE) IN NACL 1000-0.9 UT/500ML-% IV SOLN
INTRAVENOUS | Status: DC | PRN
  Administered 2021-02-13 (×2): 500 mL

## 2021-02-13 MED ORDER — ASPIRIN 81 MG PO CHEW: 81.0000 mg | CHEWABLE_TABLET | ORAL | Status: DC

## 2021-02-13 MED ORDER — FENTANYL CITRATE (PF) 100 MCG/2ML IJ SOLN
INTRAMUSCULAR | Status: AC
  Filled 2021-02-13: qty 2

## 2021-02-13 SURGICAL SUPPLY — 9 items
CATH 5FR JL3.5 JR4 ANG PIG MP (CATHETERS) ×2 IMPLANT
DEVICE RAD COMP TR BAND LRG (VASCULAR PRODUCTS) ×2 IMPLANT
GLIDESHEATH SLEND SS 6F .021 (SHEATH) ×2 IMPLANT
GUIDEWIRE INQWIRE 1.5J.035X260 (WIRE) IMPLANT
INQWIRE 1.5J .035X260CM (WIRE) ×4
KIT HEART LEFT (KITS) ×2 IMPLANT
PACK CARDIAC CATHETERIZATION (CUSTOM PROCEDURE TRAY) ×2 IMPLANT
TRANSDUCER W/STOPCOCK (MISCELLANEOUS) ×2 IMPLANT
TUBING CIL FLEX 10 FLL-RA (TUBING) ×2 IMPLANT

## 2021-02-13 NOTE — Interval H&P Note (Signed)
History and Physical Interval Note:  02/13/2021 10:28 AM  Kelly Hansen  has presented today for surgery, with the diagnosis of angina.  The various methods of treatment have been discussed with the patient and family. After consideration of risks, benefits and other options for treatment, the patient has consented to  Procedure(s): LEFT HEART CATH AND CORONARY ANGIOGRAPHY (N/A) as a surgical intervention.  The patient's history has been reviewed, patient examined, no change in status, stable for surgery.  I have reviewed the patient's chart and labs.  Questions were answered to the patient's satisfaction.     Tonny Bollman

## 2021-02-14 ENCOUNTER — Other Ambulatory Visit (HOSPITAL_COMMUNITY): Payer: Self-pay

## 2021-02-16 IMAGING — DX DG CHEST 1V PORT
1 series · 1 of 1 positions shown · non-contrast
Comparison: November 18, 2015

CLINICAL DATA: Shortness of breath.  Recent KJAXE-X0 positive

EXAM:
PORTABLE CHEST 1 VIEW

[chest ap]
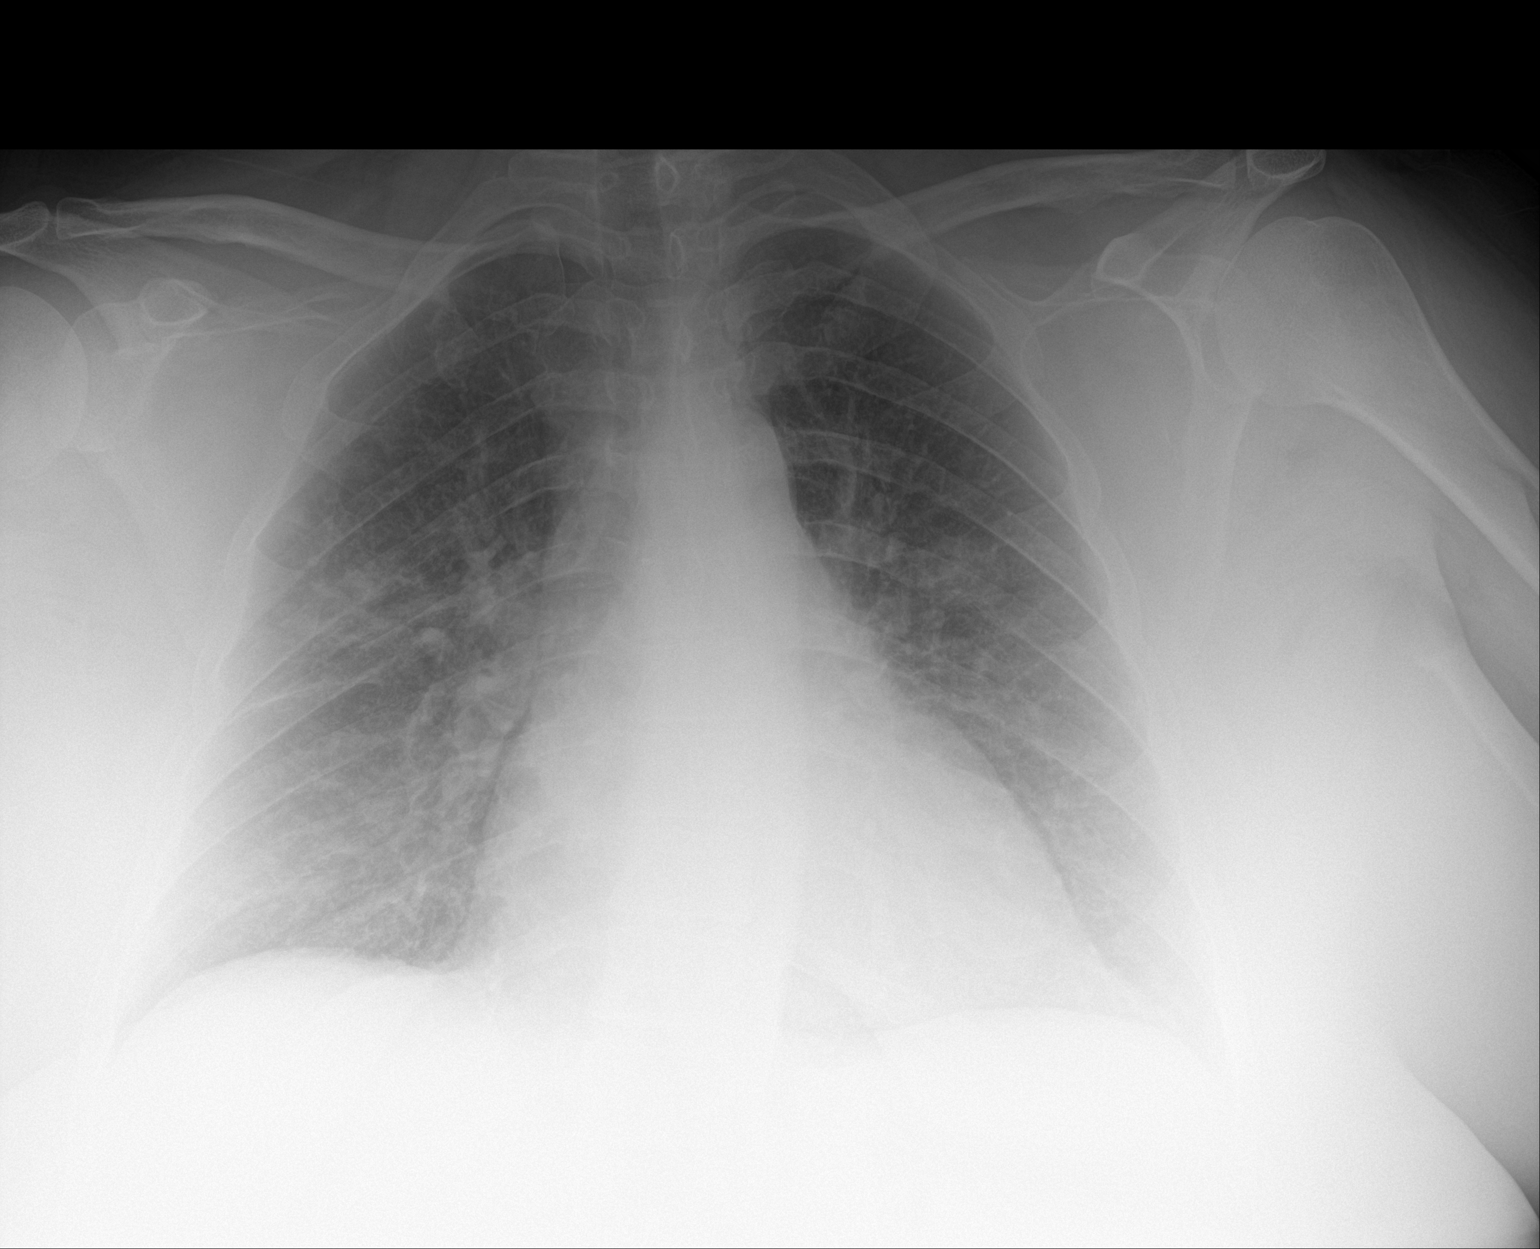

[1 of 1 positions shown; findings below may reference images not displayed]

FINDINGS: There is a focal area of airspace opacity in the right upper lobe.
Lungs elsewhere clear. Heart is upper normal in size with pulmonary
vascularity normal. No adenopathy. No bone lesions.
IMPRESSION: Focal area of apparent pneumonia right upper lobe. Lungs elsewhere
clear. Heart upper normal in size. No evident adenopathy.

## 2021-02-18 DIAGNOSIS — G4733 Obstructive sleep apnea (adult) (pediatric): Secondary | ICD-10-CM | POA: Diagnosis not present

## 2021-02-22 ENCOUNTER — Other Ambulatory Visit (HOSPITAL_COMMUNITY): Payer: Self-pay

## 2021-03-11 ENCOUNTER — Other Ambulatory Visit (HOSPITAL_COMMUNITY): Payer: Self-pay

## 2021-03-11 DIAGNOSIS — M25562 Pain in left knee: Secondary | ICD-10-CM | POA: Diagnosis not present

## 2021-03-11 DIAGNOSIS — M25561 Pain in right knee: Secondary | ICD-10-CM | POA: Diagnosis not present

## 2021-03-12 ENCOUNTER — Other Ambulatory Visit (HOSPITAL_COMMUNITY): Payer: Self-pay

## 2021-03-13 ENCOUNTER — Other Ambulatory Visit (HOSPITAL_COMMUNITY): Payer: Self-pay

## 2021-03-13 DIAGNOSIS — F320 Major depressive disorder, single episode, mild: Secondary | ICD-10-CM | POA: Diagnosis not present

## 2021-03-13 DIAGNOSIS — H1010 Acute atopic conjunctivitis, unspecified eye: Secondary | ICD-10-CM | POA: Diagnosis not present

## 2021-03-13 DIAGNOSIS — J309 Allergic rhinitis, unspecified: Secondary | ICD-10-CM | POA: Diagnosis not present

## 2021-03-13 DIAGNOSIS — G4700 Insomnia, unspecified: Secondary | ICD-10-CM | POA: Diagnosis not present

## 2021-03-13 DIAGNOSIS — K219 Gastro-esophageal reflux disease without esophagitis: Secondary | ICD-10-CM | POA: Diagnosis not present

## 2021-03-13 DIAGNOSIS — R413 Other amnesia: Secondary | ICD-10-CM | POA: Diagnosis not present

## 2021-03-13 DIAGNOSIS — M797 Fibromyalgia: Secondary | ICD-10-CM | POA: Diagnosis not present

## 2021-03-13 DIAGNOSIS — E785 Hyperlipidemia, unspecified: Secondary | ICD-10-CM | POA: Diagnosis not present

## 2021-03-13 MED ORDER — PREGABALIN 100 MG PO CAPS
100.0000 mg | ORAL_CAPSULE | Freq: Two times a day (BID) | ORAL | 0 refills | Status: DC | PRN
  Filled 2021-03-23: qty 60, 30d supply, fill #0

## 2021-03-13 MED ORDER — ZOLPIDEM TARTRATE 10 MG PO TABS
10.0000 mg | ORAL_TABLET | Freq: Every day | ORAL | 0 refills | Status: DC
  Filled 2021-03-13: qty 30, 30d supply, fill #0

## 2021-03-13 MED ORDER — OLOPATADINE HCL 0.1 % OP SOLN
OPHTHALMIC | 0 refills | Status: AC
  Filled 2021-03-13: qty 10, 90d supply, fill #0

## 2021-03-23 ENCOUNTER — Other Ambulatory Visit (HOSPITAL_COMMUNITY): Payer: Self-pay

## 2021-04-03 ENCOUNTER — Other Ambulatory Visit (HOSPITAL_COMMUNITY): Payer: Self-pay

## 2021-04-03 DIAGNOSIS — J309 Allergic rhinitis, unspecified: Secondary | ICD-10-CM | POA: Diagnosis not present

## 2021-04-03 DIAGNOSIS — R252 Cramp and spasm: Secondary | ICD-10-CM | POA: Diagnosis not present

## 2021-04-03 DIAGNOSIS — M5440 Lumbago with sciatica, unspecified side: Secondary | ICD-10-CM | POA: Diagnosis not present

## 2021-04-03 DIAGNOSIS — G4700 Insomnia, unspecified: Secondary | ICD-10-CM | POA: Diagnosis not present

## 2021-04-03 DIAGNOSIS — E559 Vitamin D deficiency, unspecified: Secondary | ICD-10-CM | POA: Diagnosis not present

## 2021-04-03 MED ORDER — ZOLPIDEM TARTRATE 10 MG PO TABS
10.0000 mg | ORAL_TABLET | Freq: Every day | ORAL | 0 refills | Status: DC
  Filled 2021-04-03 – 2021-04-10 (×3): qty 30, 30d supply, fill #0

## 2021-04-03 MED ORDER — PREGABALIN 100 MG PO CAPS
ORAL_CAPSULE | ORAL | 0 refills | Status: DC
  Filled 2021-04-03 – 2021-04-04 (×2): qty 60, 30d supply, fill #0

## 2021-04-04 ENCOUNTER — Other Ambulatory Visit (HOSPITAL_COMMUNITY): Payer: Self-pay

## 2021-04-04 MED ORDER — PREDNISONE 10 MG PO TABS
ORAL_TABLET | ORAL | 0 refills | Status: DC
  Filled 2021-04-04: qty 21, 6d supply, fill #0

## 2021-04-06 ENCOUNTER — Other Ambulatory Visit (HOSPITAL_COMMUNITY): Payer: Self-pay

## 2021-04-08 ENCOUNTER — Other Ambulatory Visit (HOSPITAL_COMMUNITY): Payer: Self-pay

## 2021-04-08 MED ORDER — MONTELUKAST SODIUM 10 MG PO TABS
10.0000 mg | ORAL_TABLET | Freq: Every evening | ORAL | 0 refills | Status: DC
  Filled 2021-04-08: qty 90, 90d supply, fill #0

## 2021-04-10 ENCOUNTER — Other Ambulatory Visit (HOSPITAL_COMMUNITY): Payer: Self-pay

## 2021-04-11 ENCOUNTER — Encounter: Payer: Self-pay | Admitting: Cardiology

## 2021-04-11 ENCOUNTER — Ambulatory Visit: Payer: Medicare HMO | Admitting: Cardiology

## 2021-04-11 ENCOUNTER — Other Ambulatory Visit: Payer: Self-pay

## 2021-04-11 DIAGNOSIS — I2510 Atherosclerotic heart disease of native coronary artery without angina pectoris: Secondary | ICD-10-CM | POA: Insufficient documentation

## 2021-04-11 DIAGNOSIS — R079 Chest pain, unspecified: Secondary | ICD-10-CM | POA: Diagnosis not present

## 2021-04-11 HISTORY — DX: Chest pain, unspecified: R07.9

## 2021-04-11 HISTORY — DX: Atherosclerotic heart disease of native coronary artery without angina pectoris: I25.10

## 2021-04-11 NOTE — Progress Notes (Signed)
Cardiology Office Note:    Date:  04/11/2021   ID:  Kelly Hansen, DOB 07/17/1966, MRN [id number]  PCP:  Lucianne Lei, MD  Cardiologist:  Garwin Brothers, MD   Referring MD: Lucianne Lei, MD    ASSESSMENT:    No diagnosis found. PLAN:    In order of problems listed above:  Mild coronary artery disease: Secondary prevention stressed with the patient.  Importance of compliance with diet medication stressed and she vocalized understanding.  She was advised to walk at least half an hour a day 5 days a week and she promises to do so. Mixed dyslipidemia: On statins.  Lipids followed by primary care doctor.  Diet was emphasized. Morbid obesity: Weight reduction was stressed diet was emphasized.  Risks of obesity explained.  She promises to do better. Acid peptic disease with reflux: She will discuss this with her primary care physician and initiate therapy to see if she feels better with her chest pain symptoms.  She will be also evaluated by primary care for noncardiac chest pain.  She will be seen in follow-up appointment on a as needed basis.   Medication Adjustments/Labs and Tests Ordered: Current medicines are reviewed at length with the patient today.  Concerns regarding medicines are outlined above.  No orders of the defined types were placed in this encounter.  No orders of the defined types were placed in this encounter.    No chief complaint on file.    History of Present Illness:    Kelly Hansen is a 55 y.o. female.  Patient was evaluated by me for significant symptoms concerning angina.  Fortunately coronary angiography has revealed nonobstructive disease and the details are mentioned below.  She is morbidly obese.  She denies any chest pain orthopnea or PND.  She has mild nonobstructive disease.  She tells me that the symptoms may not be related to reflux acid peptic disease.  At the time of my evaluation, the patient is alert awake oriented and in no distress.  Past  Medical History:  Diagnosis Date   Acid reflux    Asthma    CIN III with severe dysplasia 10/06/2016   Formatting of this note might be different from the original. Reports [facility] between 1991-2004 for abnormal Pap followed by Pap q48mos then q15mos She then had [facility] in 2004 with Dr. Thamas Jaegers Needs Pap smears for 20-25y post-hysterectomy due to presumed h/o CIN 2-3   Degeneration of lumbar intervertebral disc 03/30/2020   Depression    Increased body mass index 03/30/2020   Thyroid disease     Past Surgical History:  Procedure Laterality Date   ABDOMINAL HYSTERECTOMY     APPENDECTOMY     LEFT HEART CATH AND CORONARY ANGIOGRAPHY N/A 02/13/2021   Procedure: LEFT HEART CATH AND CORONARY ANGIOGRAPHY;  Surgeon: Tonny Bollman, MD;  Location: [facility] INVASIVE CV LAB;  Service: Cardiovascular;  Laterality: N/A;    Current Medications: Current Meds  Medication Sig   albuterol (VENTOLIN HFA) 108 (90 Base) MCG/ACT inhaler Inhale 2 puffs by mouth into the lungs 3 times a day as needed (Patient taking differently: Inhale 2 puffs into the lungs 3 (three) times daily as needed for shortness of breath or wheezing.)   aspirin EC 81 MG tablet Take 1 tablet (81 mg total) by mouth daily. Swallow whole.   baclofen (LIORESAL) 20 MG tablet Take 20 mg by mouth 2 (two) times daily.   diclofenac (VOLTAREN) 75 MG EC tablet TAKE 1 TABLET BY  MOUTH EVERY DAY AS NEEDED FOR PAIN. TAKE WITH FOOD (Patient taking differently: Take 75 mg by mouth in the morning.)   donepezil (ARICEPT) 10 MG tablet Take 10 mg by mouth at bedtime.   DULoxetine (CYMBALTA) 60 MG capsule Take 60 mg by mouth in the morning.   ergocalciferol (VITAMIN D2) 1.25 MG (50000 UT) capsule Take 1 capsule by mouth once a week (Patient taking differently: Take 50,000 Units by mouth every Thursday.)   Fluticasone-Salmeterol (ADVAIR) 250-50 MCG/DOSE AEPB Inhale 2 puffs by mouth into the lungs 2 times a day (Patient taking differently: Inhale 1 puff into the lungs 2 (two)  times daily as needed (respiratory issues).)   furosemide (LASIX) 20 MG tablet Take 40 mg by mouth 2 (two) times daily.    gabapentin (NEURONTIN) 300 MG capsule Take 600 mg by mouth 3 (three) times daily.    levothyroxine (SYNTHROID) 50 MCG tablet Take 50 mcg by mouth daily before breakfast.   memantine (NAMENDA) 10 MG tablet Take 10 mg by mouth 2 (two) times daily.   montelukast (SINGULAIR) 10 MG tablet Take 1 tablet by mouth at bedtime.   nitroGLYCERIN (NITROSTAT) 0.4 MG SL tablet Take 1 (one) Tablet as needed for chest pain-If not better in 5 minutes repeat x 2--If still not better--seek attention at ER/ Call 911 (Patient taking differently: Place 0.4 mg under the tongue every 5 (five) minutes x 3 doses as needed for chest pain.)   olopatadine (PATANOL) 0.1 % ophthalmic solution Place 1 drop into each eye as directed   oxybutynin (DITROPAN-XL) 10 MG 24 hr tablet Take 10 mg by mouth at bedtime.   pantoprazole (PROTONIX) 40 MG tablet Take 40 mg by mouth in the morning.   potassium chloride (KLOR-CON) 10 MEQ tablet Take 10 mEq by mouth 2 (two) times daily.   pramipexole (MIRAPEX) 0.125 MG tablet Take 0.125 mg by mouth 2 (two) times daily.   predniSONE (DELTASONE) 10 MG tablet Take 6 tablets by mouth daily and decrease by 1 tablet daily for 6 days.   pregabalin (LYRICA) 100 MG capsule Take 1 capsule by mouth two times daily as needed (Patient taking differently: Take 100 mg by mouth daily.)   rosuvastatin (CRESTOR) 10 MG tablet Take 10 mg by mouth at bedtime.   zolpidem (AMBIEN) 10 MG tablet Take 1 tablet by mouth at bedtime     Allergies:   Penicillins and Sulfa antibiotics   Social History   Socioeconomic History   Marital status: Married    Spouse name: Not on file   Number of children: Not on file   Years of education: Not on file   Highest education level: Not on file  Occupational History   Not on file  Tobacco Use   Smoking status: Former    Types: Cigarettes    Quit date:  03/10/2011    Years since quitting: 10.0   Smokeless tobacco: Never  Substance and Sexual Activity   Alcohol use: No   Drug use: No   Sexual activity: Not on file  Other Topics Concern   Not on file  Social History Narrative   Right handed   Lives with family    One story home few steps to get into home    Social Determinants of Health   Financial Resource Strain: Not on file  Food Insecurity: Not on file  Transportation Needs: Not on file  Physical Activity: Not on file  Stress: Not on file  Social Connections: Not on file  Family History: The patient's family history includes Dementia in her maternal aunt, maternal uncle, mother, and sister.  ROS:   Please see the history of present illness.    All other systems reviewed and are negative.  EKGs/Labs/Other Studies Reviewed:    The following studies were reviewed today: Tonny Bollman, MD (Primary)      Procedures  LEFT HEART CATH AND CORONARY ANGIOGRAPHY    Conclusion  1.  Patent coronary arteries with mild nonobstructive plaquing of the mid RCA, otherwise minimal CAD with widely patent vessels throughout 2.  Normal LV size and systolic function with LVEF estimated at 55 to 60%   Suspect noncardiac chest pain.  Risk reduction measures indicated in this patient with mild nonobstructive CAD.   Recent Labs: 02/12/2021: BUN 14; Creatinine, Ser 1.11; Hemoglobin 14.5; Platelets 302; Potassium 4.4; Sodium 140  Recent Lipid Panel No results found for: CHOL, TRIG, HDL, CHOLHDL, VLDL, LDLCALC, LDLDIRECT  Physical Exam:    VS:  BP 113/75 (BP Location: Right Arm, Patient Position: Sitting, Cuff Size: Large)   Pulse 70   Ht 5' 1.75" (1.568 m)   Wt 260 lb (117.9 kg)   SpO2 95%   BMI 47.94 kg/m     Wt Readings from Last 3 Encounters:  04/11/21 260 lb (117.9 kg)  02/13/21 265 lb (120.2 kg)  02/12/21 265 lb (120.2 kg)     GEN: Patient is in no acute distress HEENT: Normal NECK: No JVD; No carotid  bruits LYMPHATICS: No lymphadenopathy CARDIAC: Hear sounds regular, 2/6 systolic murmur at the apex. RESPIRATORY:  Clear to auscultation without rales, wheezing or rhonchi  ABDOMEN: Soft, non-tender, non-distended MUSCULOSKELETAL:  No edema; No deformity  SKIN: Warm and dry NEUROLOGIC:  Alert and oriented x 3 PSYCHIATRIC:  Normal affect   Signed, Garwin Brothers, MD  04/11/2021 12:12 PM    [facility]

## 2021-04-11 NOTE — Patient Instructions (Signed)

## 2021-04-19 IMAGING — MR MR HEAD WO/W CM
8 series · 48 of 48 positions shown · IV contrast (multihance)
Comparison: Head CT 10/29/2017

CLINICAL DATA: Memory loss.

EXAM:
MRI HEAD WITHOUT AND WITH CONTRAST
TECHNIQUE: Multiplanar, multiecho pulse sequences of the brain and surrounding
structures were obtained without and with intravenous contrast.
CONTRAST:  20mL MULTIHANCE GADOBENATE DIMEGLUMINE 529 MG/ML IV SOLN

[Series 5: T1 · sagittal · 4.0mm · 0.75mm/px · 4 of 29 slices shown (1 of 2)]
[im 1/29]
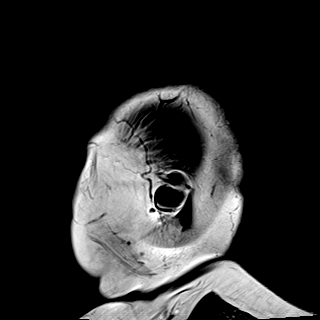
[im 10/29]
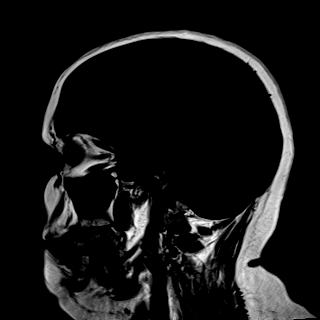
[im 19/29]
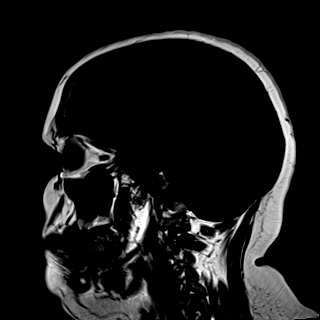
[im 29/29]
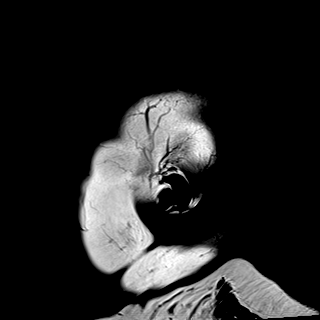

[Series 7: DWI · axial · 3.0mm · 1.44mm/px · z∈[-56,+78]mm · 6 of 42 slices shown]
[im 1/42]
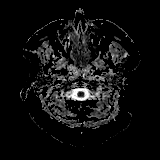
[im 9/42]
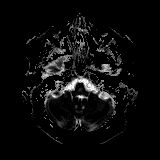
[im 17/42]
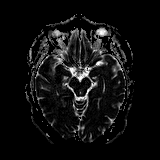
[im 25/42]
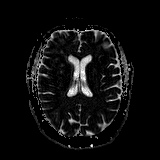
[im 33/42]
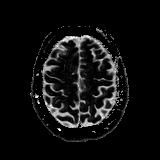
[im 42/42]
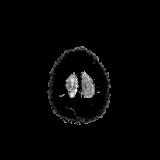

[Series 8: T2 · axial · 4.0mm · 0.36mm/px · z∈[-56,+78]mm · 3 of 27 slices shown]
[im 1/27]
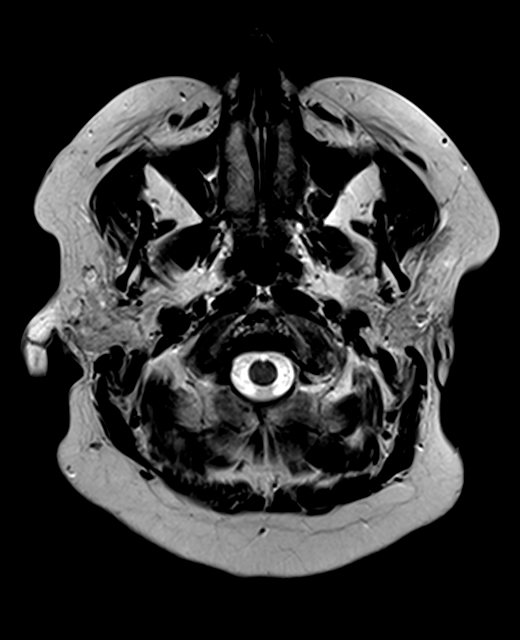
[im 14/27]
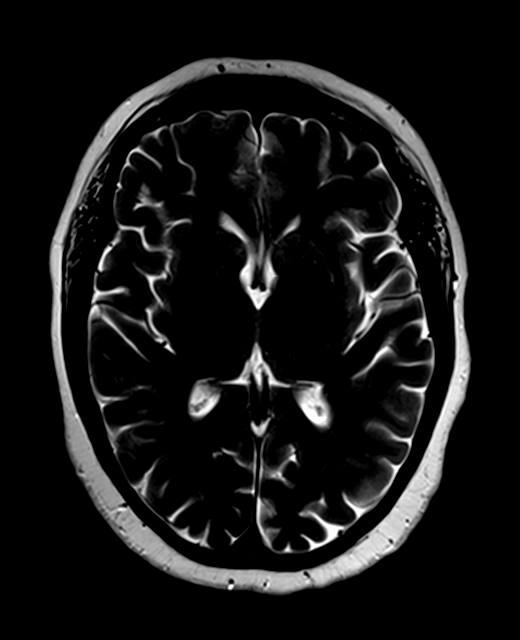
[im 27/27]
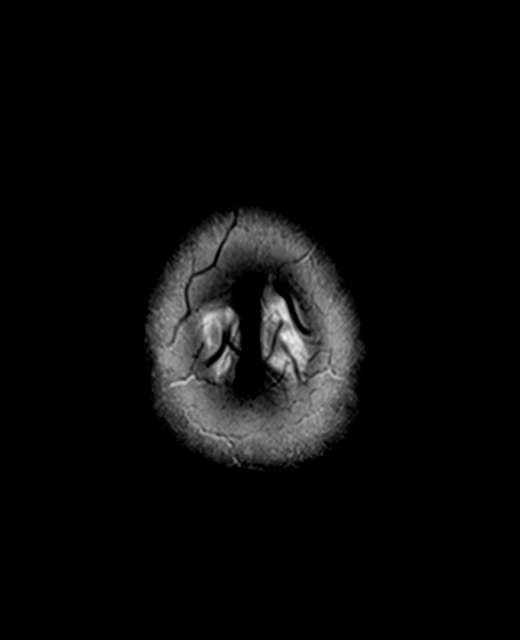

[Series 9: FLAIR · axial · 3.0mm · 0.72mm/px · z∈[-56,+78]mm · 3 of 26 slices shown]
[im 1/26]
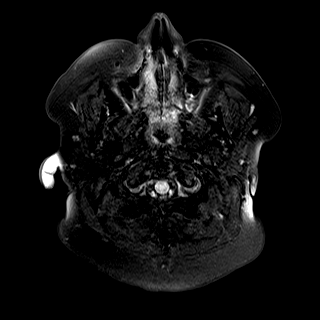
[im 13/26]
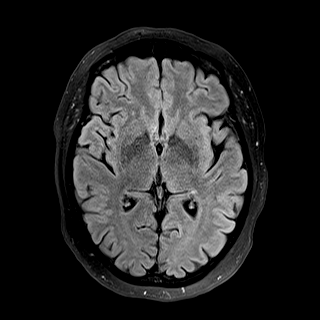
[im 26/26]
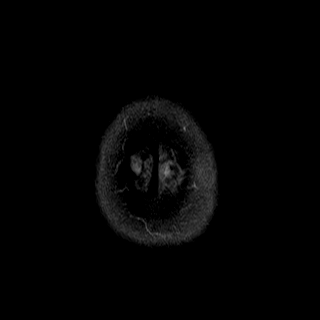

[Series 11: swi_images · axial · 2.0mm · 0.90mm/px · z∈[-59,+81]mm · 8 of 72 slices shown]
[im 1/72]
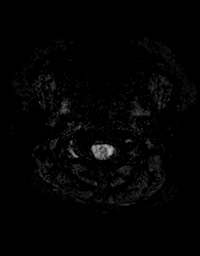
[im 11/72]
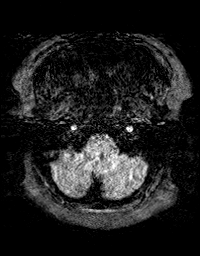
[im 21/72]
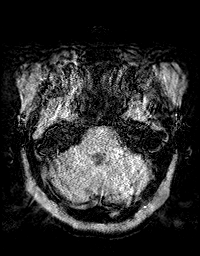
[im 31/72]
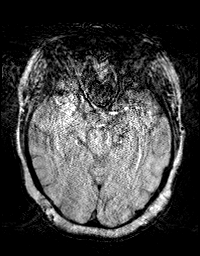
[im 41/72]
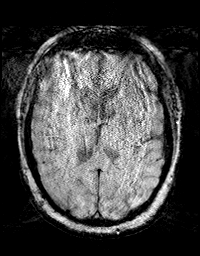
[im 51/72]
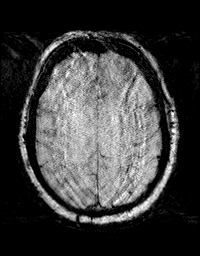
[im 61/72]
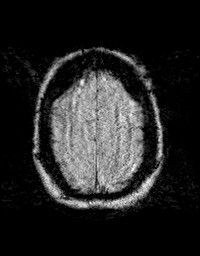
[im 72/72]
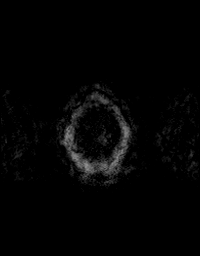

[Series 15: T2 post-contrast · coronal · 4.0mm · 0.36mm/px · 3 of 30 slices shown]
[im 1/30]
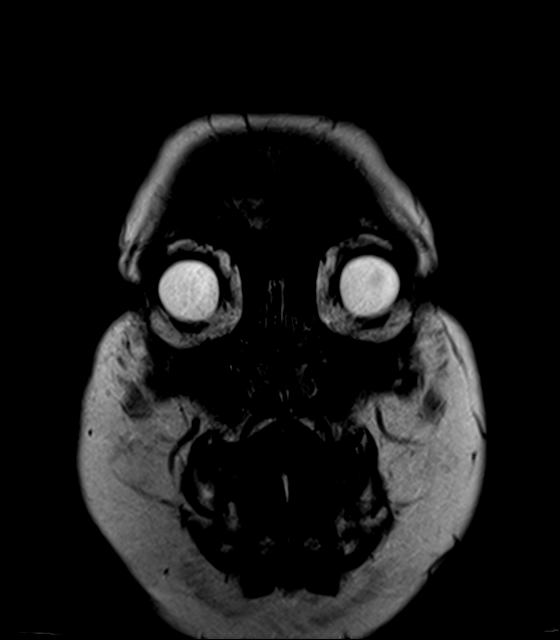
[im 15/30]
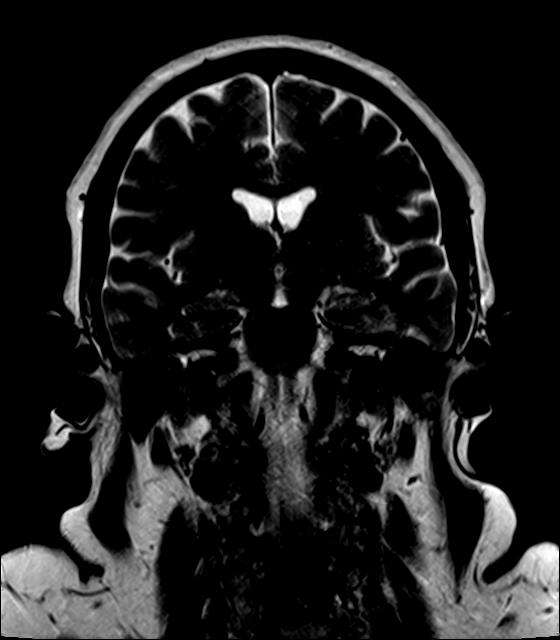
[im 30/30]
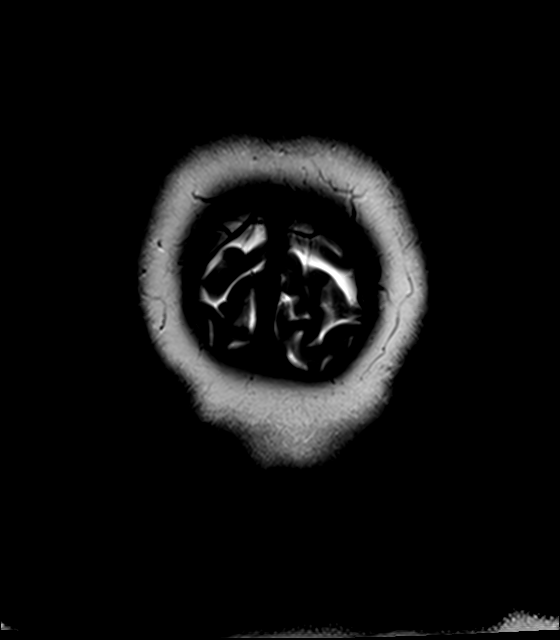

[Series 16: T1 · axial · 1.0mm · 0.94mm/px · z∈[-67,+90]mm · 18 of 160 slices shown (2 of 2)]
[im 1/160]
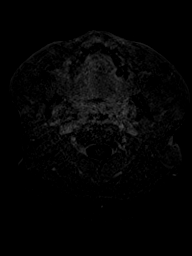
[im 10/160]
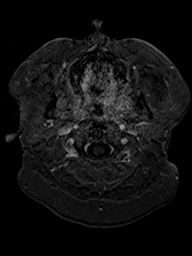
[im 19/160]
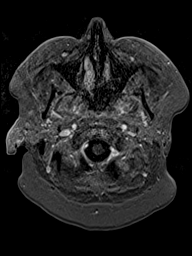
[im 29/160]
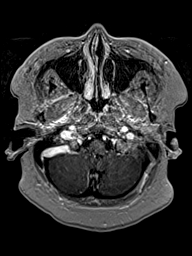
[im 38/160]
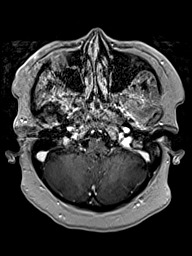
[im 47/160]
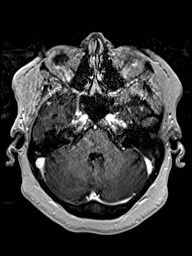
[im 57/160]
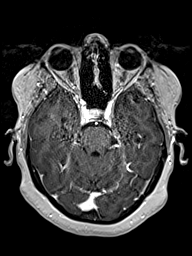
[im 66/160]
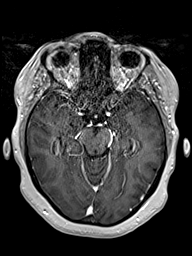
[im 75/160]
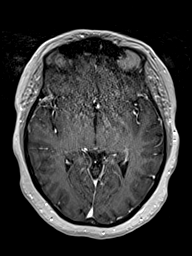
[im 85/160]
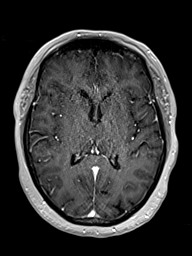
[im 94/160]
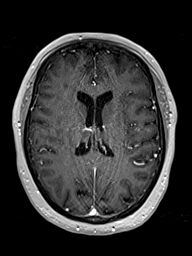
[im 103/160]
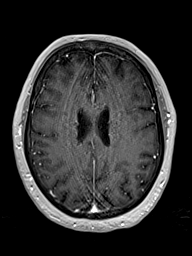
[im 113/160]
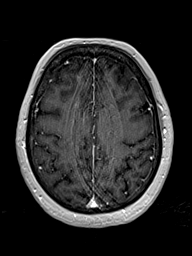
[im 122/160]
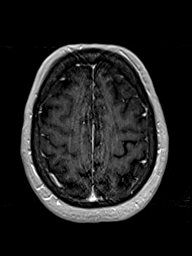
[im 131/160]
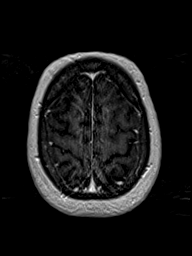
[im 141/160]
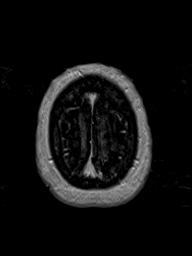
[im 150/160]
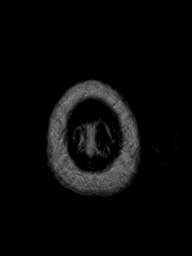
[im 160/160]
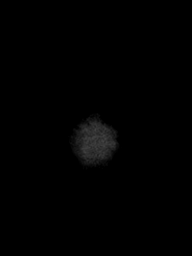

[Series 17: T1 post-contrast · coronal · 4.0mm · 0.72mm/px · 3 of 30 slices shown]
[im 1/30]
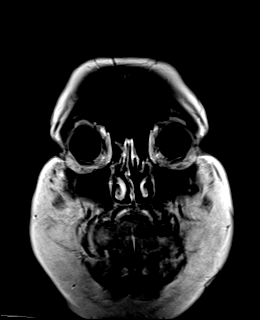
[im 15/30]
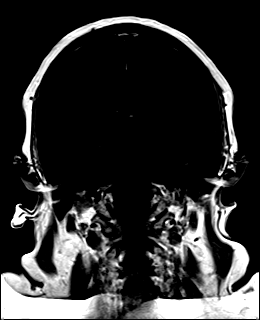
[im 30/30]
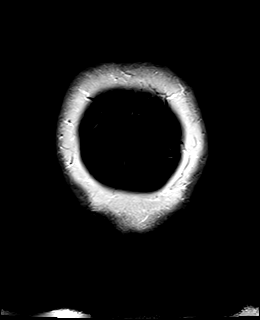

[48 of 48 positions shown; findings below may reference images not displayed]

FINDINGS: The study is mildly motion degraded despite repeated imaging
attempts.

Brain: There is no evidence of acute infarct, intracranial
hemorrhage, mass, midline shift, or extra-axial fluid collection.
The ventricles and sulci are within normal limits for age. Small
foci of T2 hyperintensity are present in the juxtacortical and deep
white matter of both frontal lobes, mild for age. No abnormal
enhancement is identified.

Vascular: Major intracranial vascular flow voids are preserved.

Skull and upper cervical spine: Unremarkable bone marrow signal.

Sinuses/Orbits: Unremarkable orbits. Clear paranasal sinuses. Small
left mastoid effusion.

Other: None.
IMPRESSION: 1. No acute intracranial abnormality.
2. Mild cerebral white matter T2 signal changes, nonspecific though
most often seen with chronic small vessel ischemia.

## 2021-04-24 ENCOUNTER — Other Ambulatory Visit (HOSPITAL_COMMUNITY): Payer: Self-pay

## 2021-04-25 ENCOUNTER — Other Ambulatory Visit (HOSPITAL_COMMUNITY): Payer: Self-pay

## 2021-04-25 DIAGNOSIS — J019 Acute sinusitis, unspecified: Secondary | ICD-10-CM | POA: Diagnosis not present

## 2021-04-25 DIAGNOSIS — R0981 Nasal congestion: Secondary | ICD-10-CM | POA: Diagnosis not present

## 2021-04-25 DIAGNOSIS — Z20828 Contact with and (suspected) exposure to other viral communicable diseases: Secondary | ICD-10-CM | POA: Diagnosis not present

## 2021-04-25 MED ORDER — ROSUVASTATIN CALCIUM 10 MG PO TABS
ORAL_TABLET | ORAL | 0 refills | Status: DC
  Filled 2021-04-25: qty 90, 90d supply, fill #0

## 2021-04-25 MED ORDER — DOXYCYCLINE HYCLATE 100 MG PO CAPS
ORAL_CAPSULE | ORAL | 0 refills | Status: DC
  Filled 2021-04-25: qty 14, 7d supply, fill #0

## 2021-04-30 ENCOUNTER — Other Ambulatory Visit (HOSPITAL_COMMUNITY): Payer: Self-pay

## 2021-04-30 DIAGNOSIS — M797 Fibromyalgia: Secondary | ICD-10-CM | POA: Diagnosis not present

## 2021-04-30 DIAGNOSIS — G4700 Insomnia, unspecified: Secondary | ICD-10-CM | POA: Diagnosis not present

## 2021-04-30 DIAGNOSIS — U071 COVID-19: Secondary | ICD-10-CM | POA: Diagnosis not present

## 2021-04-30 MED ORDER — PREGABALIN 100 MG PO CAPS
ORAL_CAPSULE | ORAL | 0 refills | Status: DC
  Filled 2021-04-30: qty 60, 30d supply, fill #0

## 2021-04-30 MED ORDER — ZOLPIDEM TARTRATE 10 MG PO TABS
10.0000 mg | ORAL_TABLET | Freq: Every day | ORAL | 0 refills | Status: DC
  Filled 2021-04-30 – 2021-05-11 (×2): qty 30, 30d supply, fill #0

## 2021-05-04 ENCOUNTER — Other Ambulatory Visit (HOSPITAL_COMMUNITY): Payer: Self-pay

## 2021-05-11 ENCOUNTER — Other Ambulatory Visit (HOSPITAL_COMMUNITY): Payer: Self-pay

## 2021-05-13 ENCOUNTER — Other Ambulatory Visit (HOSPITAL_COMMUNITY): Payer: Self-pay

## 2021-05-13 MED ORDER — ERGOCALCIFEROL 1.25 MG (50000 UT) PO CAPS
ORAL_CAPSULE | ORAL | 0 refills | Status: DC
  Filled 2021-05-13: qty 12, 84d supply, fill #0

## 2021-06-02 ENCOUNTER — Other Ambulatory Visit (HOSPITAL_COMMUNITY): Payer: Self-pay

## 2021-06-03 ENCOUNTER — Other Ambulatory Visit (HOSPITAL_COMMUNITY): Payer: Self-pay

## 2021-06-03 DIAGNOSIS — E559 Vitamin D deficiency, unspecified: Secondary | ICD-10-CM | POA: Diagnosis not present

## 2021-06-03 DIAGNOSIS — K219 Gastro-esophageal reflux disease without esophagitis: Secondary | ICD-10-CM | POA: Diagnosis not present

## 2021-06-03 DIAGNOSIS — G4700 Insomnia, unspecified: Secondary | ICD-10-CM | POA: Diagnosis not present

## 2021-06-03 DIAGNOSIS — M159 Polyosteoarthritis, unspecified: Secondary | ICD-10-CM | POA: Diagnosis not present

## 2021-06-03 DIAGNOSIS — Z79899 Other long term (current) drug therapy: Secondary | ICD-10-CM | POA: Diagnosis not present

## 2021-06-03 DIAGNOSIS — E876 Hypokalemia: Secondary | ICD-10-CM | POA: Diagnosis not present

## 2021-06-03 DIAGNOSIS — M7989 Other specified soft tissue disorders: Secondary | ICD-10-CM | POA: Diagnosis not present

## 2021-06-03 DIAGNOSIS — E039 Hypothyroidism, unspecified: Secondary | ICD-10-CM | POA: Diagnosis not present

## 2021-06-03 MED ORDER — PREGABALIN 100 MG PO CAPS
ORAL_CAPSULE | ORAL | 0 refills | Status: DC
  Filled 2021-06-03: qty 60, 30d supply, fill #0

## 2021-06-03 MED ORDER — FAMOTIDINE 20 MG PO TABS
ORAL_TABLET | ORAL | 0 refills | Status: DC
  Filled 2021-06-03: qty 180, 90d supply, fill #0

## 2021-06-03 MED ORDER — ZOLPIDEM TARTRATE 10 MG PO TABS
10.0000 mg | ORAL_TABLET | Freq: Every day | ORAL | 0 refills | Status: DC
  Filled 2021-06-03: qty 30, 30d supply, fill #0

## 2021-06-06 DIAGNOSIS — E559 Vitamin D deficiency, unspecified: Secondary | ICD-10-CM | POA: Diagnosis not present

## 2021-06-06 DIAGNOSIS — Z79899 Other long term (current) drug therapy: Secondary | ICD-10-CM | POA: Diagnosis not present

## 2021-06-06 DIAGNOSIS — E876 Hypokalemia: Secondary | ICD-10-CM | POA: Diagnosis not present

## 2021-06-06 DIAGNOSIS — E039 Hypothyroidism, unspecified: Secondary | ICD-10-CM | POA: Diagnosis not present

## 2021-06-11 DIAGNOSIS — Z6841 Body Mass Index (BMI) 40.0 and over, adult: Secondary | ICD-10-CM | POA: Diagnosis not present

## 2021-06-11 DIAGNOSIS — J019 Acute sinusitis, unspecified: Secondary | ICD-10-CM | POA: Diagnosis not present

## 2021-06-11 DIAGNOSIS — J40 Bronchitis, not specified as acute or chronic: Secondary | ICD-10-CM | POA: Diagnosis not present

## 2021-06-11 DIAGNOSIS — M545 Low back pain, unspecified: Secondary | ICD-10-CM | POA: Diagnosis not present

## 2021-06-11 DIAGNOSIS — G4700 Insomnia, unspecified: Secondary | ICD-10-CM | POA: Diagnosis not present

## 2021-07-02 DIAGNOSIS — E785 Hyperlipidemia, unspecified: Secondary | ICD-10-CM | POA: Diagnosis not present

## 2021-07-02 DIAGNOSIS — Z6841 Body Mass Index (BMI) 40.0 and over, adult: Secondary | ICD-10-CM | POA: Diagnosis not present

## 2021-07-02 DIAGNOSIS — G4730 Sleep apnea, unspecified: Secondary | ICD-10-CM | POA: Diagnosis not present

## 2021-07-02 DIAGNOSIS — G4700 Insomnia, unspecified: Secondary | ICD-10-CM | POA: Diagnosis not present

## 2021-07-02 DIAGNOSIS — G2581 Restless legs syndrome: Secondary | ICD-10-CM | POA: Diagnosis not present

## 2021-07-02 DIAGNOSIS — E039 Hypothyroidism, unspecified: Secondary | ICD-10-CM | POA: Diagnosis not present

## 2021-07-02 DIAGNOSIS — I739 Peripheral vascular disease, unspecified: Secondary | ICD-10-CM | POA: Diagnosis not present

## 2021-07-02 DIAGNOSIS — R69 Illness, unspecified: Secondary | ICD-10-CM | POA: Diagnosis not present

## 2021-07-04 DIAGNOSIS — R197 Diarrhea, unspecified: Secondary | ICD-10-CM | POA: Diagnosis not present

## 2021-07-04 DIAGNOSIS — J309 Allergic rhinitis, unspecified: Secondary | ICD-10-CM | POA: Diagnosis not present

## 2021-07-04 DIAGNOSIS — R252 Cramp and spasm: Secondary | ICD-10-CM | POA: Diagnosis not present

## 2021-07-04 DIAGNOSIS — G4700 Insomnia, unspecified: Secondary | ICD-10-CM | POA: Diagnosis not present

## 2021-07-04 DIAGNOSIS — M5440 Lumbago with sciatica, unspecified side: Secondary | ICD-10-CM | POA: Diagnosis not present

## 2021-07-04 DIAGNOSIS — E559 Vitamin D deficiency, unspecified: Secondary | ICD-10-CM | POA: Diagnosis not present

## 2021-07-04 DIAGNOSIS — N3281 Overactive bladder: Secondary | ICD-10-CM | POA: Diagnosis not present

## 2021-07-04 DIAGNOSIS — K921 Melena: Secondary | ICD-10-CM | POA: Diagnosis not present

## 2021-07-04 DIAGNOSIS — E785 Hyperlipidemia, unspecified: Secondary | ICD-10-CM | POA: Diagnosis not present

## 2021-07-04 DIAGNOSIS — R413 Other amnesia: Secondary | ICD-10-CM | POA: Diagnosis not present

## 2021-07-05 DIAGNOSIS — J309 Allergic rhinitis, unspecified: Secondary | ICD-10-CM | POA: Diagnosis not present

## 2021-07-15 ENCOUNTER — Other Ambulatory Visit (HOSPITAL_COMMUNITY): Payer: Self-pay

## 2021-07-15 MED ORDER — OLOPATADINE HCL 0.1 % OP SOLN
OPHTHALMIC | 0 refills | Status: DC
  Filled 2021-07-15: qty 5, 40d supply, fill #0

## 2021-07-17 DIAGNOSIS — K625 Hemorrhage of anus and rectum: Secondary | ICD-10-CM | POA: Diagnosis not present

## 2021-07-17 DIAGNOSIS — R197 Diarrhea, unspecified: Secondary | ICD-10-CM | POA: Diagnosis not present

## 2021-07-17 DIAGNOSIS — Z800 Family history of malignant neoplasm of digestive organs: Secondary | ICD-10-CM | POA: Diagnosis not present

## 2021-07-17 DIAGNOSIS — K52839 Microscopic colitis, unspecified: Secondary | ICD-10-CM | POA: Diagnosis not present

## 2021-07-17 DIAGNOSIS — K921 Melena: Secondary | ICD-10-CM | POA: Diagnosis not present

## 2021-07-17 LAB — HM COLONOSCOPY

## 2021-08-05 DIAGNOSIS — M797 Fibromyalgia: Secondary | ICD-10-CM | POA: Diagnosis not present

## 2021-08-05 DIAGNOSIS — E559 Vitamin D deficiency, unspecified: Secondary | ICD-10-CM | POA: Diagnosis not present

## 2021-08-05 DIAGNOSIS — G4700 Insomnia, unspecified: Secondary | ICD-10-CM | POA: Diagnosis not present

## 2021-08-05 DIAGNOSIS — J309 Allergic rhinitis, unspecified: Secondary | ICD-10-CM | POA: Diagnosis not present

## 2021-09-11 DIAGNOSIS — M797 Fibromyalgia: Secondary | ICD-10-CM | POA: Diagnosis not present

## 2021-09-11 DIAGNOSIS — G4700 Insomnia, unspecified: Secondary | ICD-10-CM | POA: Diagnosis not present

## 2021-09-11 DIAGNOSIS — Z79899 Other long term (current) drug therapy: Secondary | ICD-10-CM | POA: Diagnosis not present

## 2021-09-18 DIAGNOSIS — K649 Unspecified hemorrhoids: Secondary | ICD-10-CM | POA: Diagnosis not present

## 2021-09-18 DIAGNOSIS — K219 Gastro-esophageal reflux disease without esophagitis: Secondary | ICD-10-CM | POA: Diagnosis not present

## 2021-09-18 DIAGNOSIS — G4700 Insomnia, unspecified: Secondary | ICD-10-CM | POA: Diagnosis not present

## 2021-09-18 DIAGNOSIS — M5440 Lumbago with sciatica, unspecified side: Secondary | ICD-10-CM | POA: Diagnosis not present

## 2021-09-18 DIAGNOSIS — Z79899 Other long term (current) drug therapy: Secondary | ICD-10-CM | POA: Diagnosis not present

## 2021-09-18 DIAGNOSIS — E039 Hypothyroidism, unspecified: Secondary | ICD-10-CM | POA: Diagnosis not present

## 2021-09-18 DIAGNOSIS — M1990 Unspecified osteoarthritis, unspecified site: Secondary | ICD-10-CM | POA: Diagnosis not present

## 2021-09-18 DIAGNOSIS — R252 Cramp and spasm: Secondary | ICD-10-CM | POA: Diagnosis not present

## 2021-09-18 DIAGNOSIS — E559 Vitamin D deficiency, unspecified: Secondary | ICD-10-CM | POA: Diagnosis not present

## 2021-09-18 DIAGNOSIS — Z6841 Body Mass Index (BMI) 40.0 and over, adult: Secondary | ICD-10-CM | POA: Diagnosis not present

## 2021-09-19 DIAGNOSIS — Z79899 Other long term (current) drug therapy: Secondary | ICD-10-CM | POA: Diagnosis not present

## 2021-09-19 DIAGNOSIS — E559 Vitamin D deficiency, unspecified: Secondary | ICD-10-CM | POA: Diagnosis not present

## 2021-09-24 DIAGNOSIS — Z6841 Body Mass Index (BMI) 40.0 and over, adult: Secondary | ICD-10-CM | POA: Diagnosis not present

## 2021-09-24 DIAGNOSIS — M25562 Pain in left knee: Secondary | ICD-10-CM | POA: Diagnosis not present

## 2021-09-24 DIAGNOSIS — M25561 Pain in right knee: Secondary | ICD-10-CM | POA: Diagnosis not present

## 2021-09-24 DIAGNOSIS — Z1331 Encounter for screening for depression: Secondary | ICD-10-CM | POA: Diagnosis not present

## 2021-09-24 DIAGNOSIS — E669 Obesity, unspecified: Secondary | ICD-10-CM | POA: Diagnosis not present

## 2021-09-24 DIAGNOSIS — Z0000 Encounter for general adult medical examination without abnormal findings: Secondary | ICD-10-CM | POA: Diagnosis not present

## 2021-10-01 DIAGNOSIS — R109 Unspecified abdominal pain: Secondary | ICD-10-CM | POA: Diagnosis not present

## 2021-10-01 DIAGNOSIS — R197 Diarrhea, unspecified: Secondary | ICD-10-CM | POA: Diagnosis not present

## 2021-10-01 DIAGNOSIS — K921 Melena: Secondary | ICD-10-CM | POA: Diagnosis not present

## 2021-10-02 DIAGNOSIS — M1711 Unilateral primary osteoarthritis, right knee: Secondary | ICD-10-CM | POA: Diagnosis not present

## 2021-10-02 DIAGNOSIS — M545 Low back pain, unspecified: Secondary | ICD-10-CM | POA: Diagnosis not present

## 2021-10-02 DIAGNOSIS — Z01818 Encounter for other preprocedural examination: Secondary | ICD-10-CM | POA: Diagnosis not present

## 2021-10-07 ENCOUNTER — Ambulatory Visit: Payer: Medicare HMO | Admitting: Physician Assistant

## 2021-10-14 DIAGNOSIS — M797 Fibromyalgia: Secondary | ICD-10-CM | POA: Diagnosis not present

## 2021-10-14 DIAGNOSIS — G4700 Insomnia, unspecified: Secondary | ICD-10-CM | POA: Diagnosis not present

## 2021-10-14 DIAGNOSIS — J309 Allergic rhinitis, unspecified: Secondary | ICD-10-CM | POA: Diagnosis not present

## 2021-10-14 DIAGNOSIS — E559 Vitamin D deficiency, unspecified: Secondary | ICD-10-CM | POA: Diagnosis not present

## 2021-10-14 DIAGNOSIS — R69 Illness, unspecified: Secondary | ICD-10-CM | POA: Diagnosis not present

## 2021-10-14 DIAGNOSIS — Z79899 Other long term (current) drug therapy: Secondary | ICD-10-CM | POA: Diagnosis not present

## 2021-10-16 DIAGNOSIS — L600 Ingrowing nail: Secondary | ICD-10-CM | POA: Diagnosis not present

## 2021-10-16 DIAGNOSIS — L03032 Cellulitis of left toe: Secondary | ICD-10-CM | POA: Diagnosis not present

## 2021-10-23 DIAGNOSIS — R69 Illness, unspecified: Secondary | ICD-10-CM | POA: Diagnosis not present

## 2021-10-23 DIAGNOSIS — Z6841 Body Mass Index (BMI) 40.0 and over, adult: Secondary | ICD-10-CM | POA: Diagnosis not present

## 2021-10-23 DIAGNOSIS — E785 Hyperlipidemia, unspecified: Secondary | ICD-10-CM | POA: Diagnosis not present

## 2021-10-23 DIAGNOSIS — G4700 Insomnia, unspecified: Secondary | ICD-10-CM | POA: Diagnosis not present

## 2021-10-23 DIAGNOSIS — L03032 Cellulitis of left toe: Secondary | ICD-10-CM | POA: Diagnosis not present

## 2021-10-31 ENCOUNTER — Ambulatory Visit: Payer: Medicare HMO | Admitting: Podiatry

## 2021-11-11 ENCOUNTER — Ambulatory Visit: Payer: 59 | Admitting: Podiatry

## 2021-11-11 DIAGNOSIS — J309 Allergic rhinitis, unspecified: Secondary | ICD-10-CM | POA: Diagnosis not present

## 2021-11-11 DIAGNOSIS — Z6841 Body Mass Index (BMI) 40.0 and over, adult: Secondary | ICD-10-CM | POA: Diagnosis not present

## 2021-11-11 DIAGNOSIS — G4700 Insomnia, unspecified: Secondary | ICD-10-CM | POA: Diagnosis not present

## 2021-11-11 DIAGNOSIS — E559 Vitamin D deficiency, unspecified: Secondary | ICD-10-CM | POA: Diagnosis not present

## 2021-11-11 DIAGNOSIS — M797 Fibromyalgia: Secondary | ICD-10-CM | POA: Diagnosis not present

## 2021-11-13 DIAGNOSIS — J309 Allergic rhinitis, unspecified: Secondary | ICD-10-CM | POA: Diagnosis not present

## 2021-11-14 ENCOUNTER — Ambulatory Visit (INDEPENDENT_AMBULATORY_CARE_PROVIDER_SITE_OTHER): Payer: Medicare HMO | Admitting: Podiatry

## 2021-11-14 ENCOUNTER — Ambulatory Visit: Payer: Medicare HMO | Admitting: Podiatry

## 2021-11-14 ENCOUNTER — Other Ambulatory Visit: Payer: Self-pay

## 2021-11-14 DIAGNOSIS — L600 Ingrowing nail: Secondary | ICD-10-CM

## 2021-11-14 NOTE — Patient Instructions (Signed)

## 2021-11-15 NOTE — Progress Notes (Signed)
Subjective:  ? ?Patient ID: Kelly Hansen, female   DOB: 56 y.o.   MRN: [id number]  ? ?HPI ?Patient presents with an incurvated border of the left big toe that is been sore and she does not remember trauma.  States its been hurting for several months and she did have some redness in it and is on antibiotic from her physician which has been successful in reducing but not pain reduction.  Patient does not smoke likes to be active ? ? ?Review of Systems  ?All other systems reviewed and are negative. ? ? ?   ?Objective:  ?Physical Exam ?Vitals and nursing note reviewed.  ?Constitutional:   ?   Appearance: She is well-developed.  ?Pulmonary:  ?   Effort: Pulmonary effort is normal.  ?Musculoskeletal:     ?   General: Normal range of motion.  ?Skin: ?   General: Skin is warm.  ?Neurological:  ?   Mental Status: She is alert.  ?  ?Neurovascular status intact muscle strength found to be adequate range of motion adequate with incurvation of the medial border of the left hallux that is painful when pressed and there is no drainage no distal redness slight proximal redness but localized ? ?   ?Assessment:  ?Ingrown toenail deformity left hallux medial border with overall damage to the overall nailbed and also possibility for low-grade paronychia infection ? ?   ?Plan:  ?H&P reviewed condition explained to patient.  At this point I went ahead and I did discuss correction of the big toenail medial side explained procedure risk and patient wants surgery.  I had her sign consent form I infiltrated the left hallux 60 mg Xylocaine Marcaine mixture sterile prep done and using sterile instrumentation removed the medial border exposed the matrix applied phenol 3 applications 30 seconds followed by alcohol lavage sterile dressing gave instructions on soaks applied sterile dressing instructed on leaving it on 24 hours but take it off earlier if throbbing were to occur and encouraged her to call with questions concerns which may occur ?    ? ? ?

## 2021-11-25 DIAGNOSIS — Y999 Unspecified external cause status: Secondary | ICD-10-CM | POA: Diagnosis not present

## 2021-11-25 DIAGNOSIS — S83281A Other tear of lateral meniscus, current injury, right knee, initial encounter: Secondary | ICD-10-CM | POA: Diagnosis not present

## 2021-11-25 DIAGNOSIS — G8918 Other acute postprocedural pain: Secondary | ICD-10-CM | POA: Diagnosis not present

## 2021-11-25 DIAGNOSIS — X58XXXA Exposure to other specified factors, initial encounter: Secondary | ICD-10-CM | POA: Diagnosis not present

## 2021-11-25 DIAGNOSIS — M1711 Unilateral primary osteoarthritis, right knee: Secondary | ICD-10-CM | POA: Diagnosis not present

## 2021-11-25 DIAGNOSIS — M2341 Loose body in knee, right knee: Secondary | ICD-10-CM | POA: Diagnosis not present

## 2021-12-02 ENCOUNTER — Ambulatory Visit: Payer: 59 | Admitting: Podiatry

## 2021-12-11 DIAGNOSIS — G4700 Insomnia, unspecified: Secondary | ICD-10-CM | POA: Diagnosis not present

## 2021-12-11 DIAGNOSIS — R413 Other amnesia: Secondary | ICD-10-CM | POA: Diagnosis not present

## 2021-12-11 DIAGNOSIS — M797 Fibromyalgia: Secondary | ICD-10-CM | POA: Diagnosis not present

## 2021-12-11 DIAGNOSIS — N3281 Overactive bladder: Secondary | ICD-10-CM | POA: Diagnosis not present

## 2021-12-11 DIAGNOSIS — M179 Osteoarthritis of knee, unspecified: Secondary | ICD-10-CM | POA: Diagnosis not present

## 2022-01-07 DIAGNOSIS — Z6841 Body Mass Index (BMI) 40.0 and over, adult: Secondary | ICD-10-CM | POA: Diagnosis not present

## 2022-01-07 DIAGNOSIS — E785 Hyperlipidemia, unspecified: Secondary | ICD-10-CM | POA: Diagnosis not present

## 2022-01-07 DIAGNOSIS — G4700 Insomnia, unspecified: Secondary | ICD-10-CM | POA: Diagnosis not present

## 2022-01-07 DIAGNOSIS — M797 Fibromyalgia: Secondary | ICD-10-CM | POA: Diagnosis not present

## 2022-02-04 DIAGNOSIS — Z1231 Encounter for screening mammogram for malignant neoplasm of breast: Secondary | ICD-10-CM | POA: Diagnosis not present

## 2022-02-05 DIAGNOSIS — J309 Allergic rhinitis, unspecified: Secondary | ICD-10-CM | POA: Diagnosis not present

## 2022-02-05 DIAGNOSIS — M159 Polyosteoarthritis, unspecified: Secondary | ICD-10-CM | POA: Diagnosis not present

## 2022-02-05 DIAGNOSIS — R69 Illness, unspecified: Secondary | ICD-10-CM | POA: Diagnosis not present

## 2022-02-05 DIAGNOSIS — G4700 Insomnia, unspecified: Secondary | ICD-10-CM | POA: Diagnosis not present

## 2022-02-05 DIAGNOSIS — M7989 Other specified soft tissue disorders: Secondary | ICD-10-CM | POA: Diagnosis not present

## 2022-02-05 DIAGNOSIS — E039 Hypothyroidism, unspecified: Secondary | ICD-10-CM | POA: Diagnosis not present

## 2022-02-05 DIAGNOSIS — K219 Gastro-esophageal reflux disease without esophagitis: Secondary | ICD-10-CM | POA: Diagnosis not present

## 2022-02-05 DIAGNOSIS — Z6841 Body Mass Index (BMI) 40.0 and over, adult: Secondary | ICD-10-CM | POA: Diagnosis not present

## 2022-02-11 DIAGNOSIS — J019 Acute sinusitis, unspecified: Secondary | ICD-10-CM | POA: Diagnosis not present

## 2022-02-11 DIAGNOSIS — Z6841 Body Mass Index (BMI) 40.0 and over, adult: Secondary | ICD-10-CM | POA: Diagnosis not present

## 2022-02-17 DIAGNOSIS — R42 Dizziness and giddiness: Secondary | ICD-10-CM | POA: Diagnosis not present

## 2022-02-17 DIAGNOSIS — H68003 Unspecified Eustachian salpingitis, bilateral: Secondary | ICD-10-CM | POA: Diagnosis not present

## 2022-02-17 DIAGNOSIS — Z6841 Body Mass Index (BMI) 40.0 and over, adult: Secondary | ICD-10-CM | POA: Diagnosis not present

## 2022-03-10 DIAGNOSIS — M797 Fibromyalgia: Secondary | ICD-10-CM | POA: Diagnosis not present

## 2022-03-10 DIAGNOSIS — G4730 Sleep apnea, unspecified: Secondary | ICD-10-CM | POA: Diagnosis not present

## 2022-03-10 DIAGNOSIS — N3281 Overactive bladder: Secondary | ICD-10-CM | POA: Diagnosis not present

## 2022-03-10 DIAGNOSIS — R413 Other amnesia: Secondary | ICD-10-CM | POA: Diagnosis not present

## 2022-03-10 DIAGNOSIS — Z6841 Body Mass Index (BMI) 40.0 and over, adult: Secondary | ICD-10-CM | POA: Diagnosis not present

## 2022-03-10 DIAGNOSIS — R42 Dizziness and giddiness: Secondary | ICD-10-CM | POA: Diagnosis not present

## 2022-03-10 DIAGNOSIS — M179 Osteoarthritis of knee, unspecified: Secondary | ICD-10-CM | POA: Diagnosis not present

## 2022-03-10 DIAGNOSIS — G4700 Insomnia, unspecified: Secondary | ICD-10-CM | POA: Diagnosis not present

## 2022-03-10 DIAGNOSIS — Z79899 Other long term (current) drug therapy: Secondary | ICD-10-CM | POA: Diagnosis not present

## 2022-04-07 DIAGNOSIS — H8110 Benign paroxysmal vertigo, unspecified ear: Secondary | ICD-10-CM | POA: Diagnosis not present

## 2022-04-07 DIAGNOSIS — J310 Chronic rhinitis: Secondary | ICD-10-CM | POA: Diagnosis not present

## 2022-04-07 DIAGNOSIS — H9191 Unspecified hearing loss, right ear: Secondary | ICD-10-CM | POA: Diagnosis not present

## 2022-04-07 DIAGNOSIS — J342 Deviated nasal septum: Secondary | ICD-10-CM | POA: Diagnosis not present

## 2022-04-07 DIAGNOSIS — H9042 Sensorineural hearing loss, unilateral, left ear, with unrestricted hearing on the contralateral side: Secondary | ICD-10-CM | POA: Diagnosis not present

## 2022-04-07 DIAGNOSIS — R42 Dizziness and giddiness: Secondary | ICD-10-CM | POA: Diagnosis not present

## 2022-04-07 DIAGNOSIS — H9311 Tinnitus, right ear: Secondary | ICD-10-CM | POA: Diagnosis not present

## 2022-04-09 DIAGNOSIS — M545 Low back pain, unspecified: Secondary | ICD-10-CM | POA: Diagnosis not present

## 2022-04-09 DIAGNOSIS — G4700 Insomnia, unspecified: Secondary | ICD-10-CM | POA: Diagnosis not present

## 2022-04-09 DIAGNOSIS — G4730 Sleep apnea, unspecified: Secondary | ICD-10-CM | POA: Diagnosis not present

## 2022-04-09 DIAGNOSIS — R42 Dizziness and giddiness: Secondary | ICD-10-CM | POA: Diagnosis not present

## 2022-04-09 DIAGNOSIS — Z6841 Body Mass Index (BMI) 40.0 and over, adult: Secondary | ICD-10-CM | POA: Diagnosis not present

## 2022-04-09 DIAGNOSIS — E559 Vitamin D deficiency, unspecified: Secondary | ICD-10-CM | POA: Diagnosis not present

## 2022-04-09 DIAGNOSIS — E785 Hyperlipidemia, unspecified: Secondary | ICD-10-CM | POA: Diagnosis not present

## 2022-04-30 DIAGNOSIS — M5451 Vertebrogenic low back pain: Secondary | ICD-10-CM | POA: Diagnosis not present

## 2022-04-30 DIAGNOSIS — M5459 Other low back pain: Secondary | ICD-10-CM | POA: Diagnosis not present

## 2022-05-04 DIAGNOSIS — M5451 Vertebrogenic low back pain: Secondary | ICD-10-CM | POA: Diagnosis not present

## 2022-05-12 DIAGNOSIS — G4700 Insomnia, unspecified: Secondary | ICD-10-CM | POA: Diagnosis not present

## 2022-05-12 DIAGNOSIS — M545 Low back pain, unspecified: Secondary | ICD-10-CM | POA: Diagnosis not present

## 2022-05-12 DIAGNOSIS — Z6841 Body Mass Index (BMI) 40.0 and over, adult: Secondary | ICD-10-CM | POA: Diagnosis not present

## 2022-05-12 DIAGNOSIS — M7989 Other specified soft tissue disorders: Secondary | ICD-10-CM | POA: Diagnosis not present

## 2022-05-15 DIAGNOSIS — M5451 Vertebrogenic low back pain: Secondary | ICD-10-CM | POA: Diagnosis not present

## 2022-06-12 DIAGNOSIS — G4730 Sleep apnea, unspecified: Secondary | ICD-10-CM | POA: Diagnosis not present

## 2022-06-12 DIAGNOSIS — Z6841 Body Mass Index (BMI) 40.0 and over, adult: Secondary | ICD-10-CM | POA: Diagnosis not present

## 2022-06-12 DIAGNOSIS — M797 Fibromyalgia: Secondary | ICD-10-CM | POA: Diagnosis not present

## 2022-06-12 DIAGNOSIS — R413 Other amnesia: Secondary | ICD-10-CM | POA: Diagnosis not present

## 2022-06-12 DIAGNOSIS — M545 Low back pain, unspecified: Secondary | ICD-10-CM | POA: Diagnosis not present

## 2022-06-12 DIAGNOSIS — N3281 Overactive bladder: Secondary | ICD-10-CM | POA: Diagnosis not present

## 2022-06-12 DIAGNOSIS — G4700 Insomnia, unspecified: Secondary | ICD-10-CM | POA: Diagnosis not present

## 2022-06-17 DIAGNOSIS — M5451 Vertebrogenic low back pain: Secondary | ICD-10-CM | POA: Diagnosis not present

## 2022-06-17 DIAGNOSIS — M5136 Other intervertebral disc degeneration, lumbar region: Secondary | ICD-10-CM | POA: Diagnosis not present

## 2022-06-17 DIAGNOSIS — M47816 Spondylosis without myelopathy or radiculopathy, lumbar region: Secondary | ICD-10-CM | POA: Insufficient documentation

## 2022-06-17 HISTORY — DX: Spondylosis without myelopathy or radiculopathy, lumbar region: M47.816

## 2022-06-19 ENCOUNTER — Other Ambulatory Visit (HOSPITAL_COMMUNITY): Payer: Self-pay

## 2022-06-19 DIAGNOSIS — M5451 Vertebrogenic low back pain: Secondary | ICD-10-CM | POA: Diagnosis not present

## 2022-06-19 DIAGNOSIS — M5416 Radiculopathy, lumbar region: Secondary | ICD-10-CM | POA: Diagnosis not present

## 2022-06-20 DIAGNOSIS — J019 Acute sinusitis, unspecified: Secondary | ICD-10-CM | POA: Diagnosis not present

## 2022-06-20 DIAGNOSIS — J209 Acute bronchitis, unspecified: Secondary | ICD-10-CM | POA: Diagnosis not present

## 2022-06-20 DIAGNOSIS — Z6841 Body Mass Index (BMI) 40.0 and over, adult: Secondary | ICD-10-CM | POA: Diagnosis not present

## 2022-06-24 DIAGNOSIS — M5451 Vertebrogenic low back pain: Secondary | ICD-10-CM | POA: Diagnosis not present

## 2022-06-24 DIAGNOSIS — M5416 Radiculopathy, lumbar region: Secondary | ICD-10-CM

## 2022-06-24 HISTORY — DX: Radiculopathy, lumbar region: M54.16

## 2022-07-01 DIAGNOSIS — M5416 Radiculopathy, lumbar region: Secondary | ICD-10-CM | POA: Diagnosis not present

## 2022-07-01 DIAGNOSIS — M5451 Vertebrogenic low back pain: Secondary | ICD-10-CM | POA: Diagnosis not present

## 2022-07-10 DIAGNOSIS — M5451 Vertebrogenic low back pain: Secondary | ICD-10-CM | POA: Diagnosis not present

## 2022-07-10 DIAGNOSIS — M5416 Radiculopathy, lumbar region: Secondary | ICD-10-CM | POA: Diagnosis not present

## 2022-07-15 DIAGNOSIS — M5416 Radiculopathy, lumbar region: Secondary | ICD-10-CM | POA: Diagnosis not present

## 2022-07-15 DIAGNOSIS — M5451 Vertebrogenic low back pain: Secondary | ICD-10-CM | POA: Diagnosis not present

## 2022-07-17 DIAGNOSIS — M5416 Radiculopathy, lumbar region: Secondary | ICD-10-CM | POA: Diagnosis not present

## 2022-07-17 DIAGNOSIS — M5451 Vertebrogenic low back pain: Secondary | ICD-10-CM | POA: Diagnosis not present

## 2022-07-18 DIAGNOSIS — K219 Gastro-esophageal reflux disease without esophagitis: Secondary | ICD-10-CM | POA: Diagnosis not present

## 2022-07-18 DIAGNOSIS — Z6841 Body Mass Index (BMI) 40.0 and over, adult: Secondary | ICD-10-CM | POA: Diagnosis not present

## 2022-07-18 DIAGNOSIS — M159 Polyosteoarthritis, unspecified: Secondary | ICD-10-CM | POA: Diagnosis not present

## 2022-07-18 DIAGNOSIS — J019 Acute sinusitis, unspecified: Secondary | ICD-10-CM | POA: Diagnosis not present

## 2022-07-22 DIAGNOSIS — M5416 Radiculopathy, lumbar region: Secondary | ICD-10-CM | POA: Diagnosis not present

## 2022-07-22 DIAGNOSIS — M5451 Vertebrogenic low back pain: Secondary | ICD-10-CM | POA: Diagnosis not present

## 2022-07-23 DIAGNOSIS — M47816 Spondylosis without myelopathy or radiculopathy, lumbar region: Secondary | ICD-10-CM | POA: Diagnosis not present

## 2022-08-01 DIAGNOSIS — G4700 Insomnia, unspecified: Secondary | ICD-10-CM | POA: Diagnosis not present

## 2022-08-01 DIAGNOSIS — E039 Hypothyroidism, unspecified: Secondary | ICD-10-CM | POA: Diagnosis not present

## 2022-08-01 DIAGNOSIS — G309 Alzheimer's disease, unspecified: Secondary | ICD-10-CM | POA: Diagnosis not present

## 2022-08-01 DIAGNOSIS — E785 Hyperlipidemia, unspecified: Secondary | ICD-10-CM | POA: Diagnosis not present

## 2022-08-01 DIAGNOSIS — R42 Dizziness and giddiness: Secondary | ICD-10-CM | POA: Diagnosis not present

## 2022-08-01 DIAGNOSIS — R609 Edema, unspecified: Secondary | ICD-10-CM | POA: Diagnosis not present

## 2022-08-01 DIAGNOSIS — I739 Peripheral vascular disease, unspecified: Secondary | ICD-10-CM | POA: Diagnosis not present

## 2022-08-01 DIAGNOSIS — M1990 Unspecified osteoarthritis, unspecified site: Secondary | ICD-10-CM | POA: Diagnosis not present

## 2022-08-01 DIAGNOSIS — J45909 Unspecified asthma, uncomplicated: Secondary | ICD-10-CM | POA: Diagnosis not present

## 2022-08-01 DIAGNOSIS — K219 Gastro-esophageal reflux disease without esophagitis: Secondary | ICD-10-CM | POA: Diagnosis not present

## 2022-08-01 DIAGNOSIS — G2581 Restless legs syndrome: Secondary | ICD-10-CM | POA: Diagnosis not present

## 2022-08-15 DIAGNOSIS — G4730 Sleep apnea, unspecified: Secondary | ICD-10-CM | POA: Diagnosis not present

## 2022-08-15 DIAGNOSIS — M545 Low back pain, unspecified: Secondary | ICD-10-CM | POA: Diagnosis not present

## 2022-08-15 DIAGNOSIS — Z6841 Body Mass Index (BMI) 40.0 and over, adult: Secondary | ICD-10-CM | POA: Diagnosis not present

## 2022-09-16 DIAGNOSIS — Z6841 Body Mass Index (BMI) 40.0 and over, adult: Secondary | ICD-10-CM | POA: Diagnosis not present

## 2022-09-16 DIAGNOSIS — J019 Acute sinusitis, unspecified: Secondary | ICD-10-CM | POA: Diagnosis not present

## 2022-09-17 DIAGNOSIS — M797 Fibromyalgia: Secondary | ICD-10-CM | POA: Diagnosis not present

## 2022-09-17 DIAGNOSIS — J45909 Unspecified asthma, uncomplicated: Secondary | ICD-10-CM | POA: Diagnosis not present

## 2022-09-17 DIAGNOSIS — G4700 Insomnia, unspecified: Secondary | ICD-10-CM | POA: Diagnosis not present

## 2022-09-19 DIAGNOSIS — J45909 Unspecified asthma, uncomplicated: Secondary | ICD-10-CM | POA: Diagnosis not present

## 2022-09-20 DIAGNOSIS — J45909 Unspecified asthma, uncomplicated: Secondary | ICD-10-CM | POA: Diagnosis not present

## 2022-09-20 DIAGNOSIS — J209 Acute bronchitis, unspecified: Secondary | ICD-10-CM | POA: Diagnosis not present

## 2022-09-20 DIAGNOSIS — J069 Acute upper respiratory infection, unspecified: Secondary | ICD-10-CM | POA: Diagnosis not present

## 2022-09-20 DIAGNOSIS — R051 Acute cough: Secondary | ICD-10-CM | POA: Diagnosis not present

## 2022-09-20 DIAGNOSIS — R0981 Nasal congestion: Secondary | ICD-10-CM | POA: Diagnosis not present

## 2022-10-09 ENCOUNTER — Other Ambulatory Visit: Payer: Self-pay

## 2022-10-09 ENCOUNTER — Encounter (HOSPITAL_BASED_OUTPATIENT_CLINIC_OR_DEPARTMENT_OTHER): Payer: Self-pay

## 2022-10-09 ENCOUNTER — Emergency Department (HOSPITAL_BASED_OUTPATIENT_CLINIC_OR_DEPARTMENT_OTHER)
Admission: EM | Admit: 2022-10-09 | Discharge: 2022-10-09 | Disposition: A | Discharge status: Home / Self Care | Payer: Medicare HMO | Attending: Emergency Medicine | Admitting: Emergency Medicine

## 2022-10-09 ENCOUNTER — Emergency Department (HOSPITAL_BASED_OUTPATIENT_CLINIC_OR_DEPARTMENT_OTHER): Payer: Medicare HMO | Admitting: Radiology

## 2022-10-09 DIAGNOSIS — J45909 Unspecified asthma, uncomplicated: Secondary | ICD-10-CM | POA: Diagnosis not present

## 2022-10-09 DIAGNOSIS — Z7982 Long term (current) use of aspirin: Secondary | ICD-10-CM | POA: Diagnosis not present

## 2022-10-09 DIAGNOSIS — Z20822 Contact with and (suspected) exposure to covid-19: Secondary | ICD-10-CM | POA: Diagnosis not present

## 2022-10-09 DIAGNOSIS — R059 Cough, unspecified: Secondary | ICD-10-CM | POA: Diagnosis not present

## 2022-10-09 DIAGNOSIS — B338 Other specified viral diseases: Secondary | ICD-10-CM

## 2022-10-09 DIAGNOSIS — B974 Respiratory syncytial virus as the cause of diseases classified elsewhere: Secondary | ICD-10-CM | POA: Insufficient documentation

## 2022-10-09 DIAGNOSIS — R051 Acute cough: Secondary | ICD-10-CM | POA: Diagnosis not present

## 2022-10-09 DIAGNOSIS — J069 Acute upper respiratory infection, unspecified: Secondary | ICD-10-CM | POA: Insufficient documentation

## 2022-10-09 DIAGNOSIS — Z7951 Long term (current) use of inhaled steroids: Secondary | ICD-10-CM | POA: Diagnosis not present

## 2022-10-09 LAB — RESP PANEL BY RT-PCR (RSV, FLU A&B, COVID)  RVPGX2
Influenza A by PCR: NEGATIVE
Influenza B by PCR: NEGATIVE
Resp Syncytial Virus by PCR: POSITIVE — AB
SARS Coronavirus 2 by RT PCR: NEGATIVE

## 2022-10-09 MED ORDER — BENZONATATE 100 MG PO CAPS: 100.0000 mg | ORAL_CAPSULE | Freq: Three times a day (TID) | ORAL | 0 refills | Status: DC

## 2022-10-09 MED ORDER — PREDNISONE 20 MG PO TABS: 40.0000 mg | ORAL_TABLET | Freq: Every day | ORAL | 0 refills | Status: DC

## 2022-10-09 NOTE — ED Provider Triage Note (Signed)
Emergency Medicine Provider Triage Evaluation Note  Kelly Hansen , a 57 y.o. female  was evaluated in triage.  Pt complains of cough and congestion over the past 1.5 months.  She has a history of asthma and bronchitis.  No known history of COPD and she does not smoke.  States that she has been given a steroid which she completed about 2 weeks ago but symptoms persist.  No fevers.  She felt worse today prompting ED visit.  She has been staying home albuterol inhaler about twice a day.  Review of Systems  Positive: Cough Negative: Fever  Physical Exam  BP (!) 116/55 (BP Location: Right Arm)   Pulse 93   Temp 98.2 F (36.8 C)   Resp 18   Ht 5' 1.75" (1.568 m)   Wt 117.9 kg   SpO2 96%   BMI 47.93 kg/m  Gen:   Awake, no distress   Resp:  Normal effort, lungs clear to auscultation bilaterally MSK:   Moves extremities without difficulty  Other:    Medical Decision Making  Medically screening exam initiated at 4:53 PM.  Appropriate orders placed.  Kelly Hansen was informed that the remainder of the evaluation will be completed by another provider, this initial triage assessment does not replace that evaluation, and the importance of remaining in the ED until their evaluation is complete.     Carlisle Cater, PA-C 10/09/22 1654

## 2022-10-09 NOTE — ED Triage Notes (Signed)
Patient here POV from Home.  Endorses Dry Cough, Congestion for approximately 1.5 Months. Seen by a few providers and given Steroids which were mostly ineffective.   No Known Fevers.   NAD Noted during Triage. A&Ox4. GCS 15. Ambulatory.

## 2022-10-09 NOTE — Discharge Instructions (Signed)
Please read and follow all provided instructions.  Your diagnoses today include:  1. RSV infection   2. Acute cough     Tests performed today include: Chest x-ray - does not show any pneumonia COVID, flu, RSV test: Positive for RSV Vital signs. See below for your results today.   Medications prescribed:  Tessalon Perles - cough suppressant medication  Prednisone - steroid medicine   It is best to take this medication in the morning to prevent sleeping problems. If you are diabetic, monitor your blood sugar closely and stop taking Prednisone if blood sugar is over 300. Take with food to prevent stomach upset.   Take any prescribed medications only as directed.  Home care instructions:  Follow any educational materials contained in this packet.  Follow-up instructions: Please follow-up with your primary care provider in the next 5 days for further evaluation of your symptoms and a recheck if you are not feeling better.   Return instructions:  Please return to the Emergency Department if you experience worsening symptoms. Please return with worsening wheezing, shortness of breath, or difficulty breathing. Return with persistent fever above 101F.  Please return if you have any other emergent concerns.  Additional Information:  Your vital signs today were: BP 104/67   Pulse (!) 110   Temp 98.2 F (36.8 C)   Resp 18   Ht 5' 1.75" (1.568 m)   Wt 117.9 kg   SpO2 94%   BMI 47.93 kg/m  If your blood pressure (BP) was elevated above 135/85 this visit, please have this repeated by your doctor within one month. --------------

## 2022-10-09 NOTE — ED Provider Notes (Signed)
[facility] Provider Note   CSN: [id number] Arrival date & time: 10/09/22  1638     History  Chief Complaint  Patient presents with   Cough    Kelly Hansen is a 57 y.o. female.  Patient seen on arrival by myself in triage.  Pt complains of cough and congestion over the past 1.5 months.  She has a history of asthma and bronchitis.  No known history of COPD and she does not smoke.  States that she has been given a steroid and antibiotic which she completed about 2 weeks ago but symptoms persist.  No fevers.  She felt worse today prompting ED visit.  She has been using home albuterol inhaler about twice a day.  No nausea, vomiting, diarrhea.       Home Medications Prior to Admission medications   Medication Sig Start Date End Date Taking? Authorizing Provider  benzonatate (TESSALON) 100 MG capsule Take 1 capsule (100 mg total) by mouth every 8 (eight) hours. 10/09/22  Yes Carlisle Cater, PA-C  predniSONE (DELTASONE) 20 MG tablet Take 2 tablets (40 mg total) by mouth daily. 10/09/22  Yes Carlisle Cater, PA-C  albuterol (VENTOLIN HFA) 108 (90 Base) MCG/ACT inhaler Inhale 2 puffs by mouth into the lungs 3 times a day as needed Patient taking differently: Inhale 2 puffs into the lungs 3 (three) times daily as needed for shortness of breath or wheezing. 12/25/20     aspirin EC 81 MG tablet Take 1 tablet (81 mg total) by mouth daily. Swallow whole. 02/12/21   Revankar, Reita Cliche, MD  baclofen (LIORESAL) 20 MG tablet Take 20 mg by mouth 2 (two) times daily. 05/26/19   [provider]  donepezil (ARICEPT) 10 MG tablet Take 10 mg by mouth at bedtime. 06/01/19   [provider]  doxycycline (VIBRAMYCIN) 100 MG capsule Take one capsule by mouth twice daily for 7 days. 04/25/21     DULoxetine (CYMBALTA) 60 MG capsule Take 60 mg by mouth in the morning. 05/26/19   [provider]  ergocalciferol (VITAMIN D2) 1.25 MG (50000 UT) capsule  Take 1 capsule by mouth once a week. 05/13/21     famotidine (PEPCID) 20 MG tablet Take 1 (one) tablet by mouth two times daily. 06/03/21     Fluticasone-Salmeterol (ADVAIR) 250-50 MCG/DOSE AEPB Inhale 2 puffs by mouth into the lungs 2 times a day Patient taking differently: Inhale 1 puff into the lungs 2 (two) times daily as needed (respiratory issues). 12/25/20     furosemide (LASIX) 20 MG tablet Take 40 mg by mouth 2 (two) times daily.     [provider]  gabapentin (NEURONTIN) 300 MG capsule Take 600 mg by mouth 3 (three) times daily.  05/26/19   [provider]  levothyroxine (SYNTHROID) 50 MCG tablet Take 50 mcg by mouth daily before breakfast. 04/15/19   [provider]  memantine (NAMENDA) 10 MG tablet Take 10 mg by mouth 2 (two) times daily.    [provider]  montelukast (SINGULAIR) 10 MG tablet Take 1 tablet by mouth at bedtime. 04/08/21     nitroGLYCERIN (NITROSTAT) 0.4 MG SL tablet Take 1 (one) Tablet as needed for chest pain-If not better in 5 minutes repeat x 2--If still not better--seek attention at ER/ Call 911 Patient taking differently: Place 0.4 mg under the tongue every 5 (five) minutes x 3 doses as needed for chest pain. 02/08/21     olopatadine (PATANOL) 0.1 %  ophthalmic solution Place 1 drop into each eye as directed 03/13/21     oxybutynin (DITROPAN-XL) 10 MG 24 hr tablet Take 10 mg by mouth at bedtime.    [provider]  pantoprazole (PROTONIX) 40 MG tablet Take 40 mg by mouth in the morning.    [provider]  potassium chloride (KLOR-CON) 10 MEQ tablet Take 10 mEq by mouth 2 (two) times daily.    [provider]  pramipexole (MIRAPEX) 0.125 MG tablet Take 0.125 mg by mouth 2 (two) times daily. 05/26/19   [provider]  pregabalin (LYRICA) 100 MG capsule Take 1 capsule by mouth two times daily as needed Patient taking differently: Take 100 mg by mouth daily. 04/03/21     pregabalin (LYRICA) 100 MG capsule  Take 1  Capsule by mouth two times daily as needed 04/30/21     pregabalin (LYRICA) 100 MG capsule Take 1 (one) capsule by mouth two times daily as needed 06/03/21     rosuvastatin (CRESTOR) 10 MG tablet Take 10 mg by mouth at bedtime. 05/24/19   [provider]  rosuvastatin (CRESTOR) 10 MG tablet Take 1 tablet by mouth at bedtime. 04/25/21     zolpidem (AMBIEN) 10 MG tablet Take 1 tablet by mouth at bedtime 06/03/21         Allergies    Penicillins and Sulfa antibiotics    Review of Systems   Review of Systems  Physical Exam Updated Vital Signs BP 104/67   Pulse (!) 110   Temp 98.2 F (36.8 C)   Resp 18   Ht 5' 1.75" (1.568 m)   Wt 117.9 kg   SpO2 94%   BMI 47.93 kg/m   Physical Exam Vitals and nursing note reviewed.  Constitutional:      General: She is not in acute distress.    Appearance: She is well-developed.  HENT:     Head: Normocephalic and atraumatic.     Right Ear: Tympanic membrane, ear canal and external ear normal.     Left Ear: Tympanic membrane, ear canal and external ear normal.     Nose: Congestion present. No rhinorrhea.     Mouth/Throat:     Mouth: Mucous membranes are moist.  Eyes:     Conjunctiva/sclera: Conjunctivae normal.  Cardiovascular:     Rate and Rhythm: Normal rate and regular rhythm.     Heart sounds: No murmur heard. Pulmonary:     Effort: No respiratory distress.     Breath sounds: No wheezing, rhonchi or rales.     Comments: Frequent coughing during exam. Abdominal:     Palpations: Abdomen is soft.     Tenderness: There is no abdominal tenderness. There is no guarding or rebound.  Musculoskeletal:     Cervical back: Normal range of motion and neck supple.     Right lower leg: No edema.     Left lower leg: No edema.  Skin:    General: Skin is warm and dry.     Findings: No rash.  Neurological:     General: No focal deficit present.     Mental Status: She is alert. Mental status is at baseline.     Motor: No weakness.   Psychiatric:        Mood and Affect: Mood normal.     ED Results / Procedures / Treatments   Labs (all labs ordered are listed, but only abnormal results are displayed) Labs Reviewed  RESP PANEL BY RT-PCR (RSV, FLU  A&B, COVID)  RVPGX2 - Abnormal; Notable for the following components:      Result Value   Resp Syncytial Virus by PCR POSITIVE (*)    All other components within normal limits    EKG None  Radiology DG Chest 2 View  Result Date: 10/09/2022 CLINICAL DATA:  Cough EXAM: CHEST - 2 VIEW COMPARISON:  10/04/2019 FINDINGS: The heart size and mediastinal contours are within normal limits. Both lungs are clear. The visualized skeletal structures are unremarkable. IMPRESSION: No active cardiopulmonary disease. Electronically Signed   By: Jasmine Pang M.D.   On: 10/09/2022 17:19    Procedures Procedures    Medications Ordered in ED Medications - No data to display  ED Course/ Medical Decision Making/ A&P    Patient seen and examined. History obtained directly from patient. Work-up including labs, imaging, EKG ordered in triage, if performed, were reviewed.    Labs/EKG: Independently reviewed and interpreted.  This included: Flu, COVID, RSV testing: Positive for RSV.  Imaging: Independently reviewed and interpreted.  This included: Chest x-ray agree negative.  Medications/Fluids: None ordered  Most recent vital signs reviewed and are as follows: BP 104/67   Pulse (!) 110   Temp 98.2 F (36.8 C)   Resp 18   Ht 5' 1.75" (1.568 m)   Wt 117.9 kg   SpO2 94%   BMI 47.93 kg/m   Initial impression: Likely bronchitis due to RSV, asthma exacerbation  Home treatment plan: Will give another course of prednisone x 5 days, continue home albuterol, p.o. Tessalon trial.  Return instructions discussed with patient: Worsening shortness of breath, trouble breathing, fevers  Follow-up instructions discussed with patient: Follow-up with PCP in 5 to 7 days for recheck after  completion of steroids.                            Medical Decision Making Amount and/or Complexity of Data Reviewed Radiology: ordered.  Risk Prescription drug management.   Patient here with cough, positive RSV testing.  Patient with likely viral bronchitis. No concern for PNA given normal lung exam/x-ray. Antibiotics not indicated.  Will give another steroid burst, continue home albuterol and treat cough.  No concern for ACS, PE.  Patient appears well, nontoxic.  No concern for sepsis.  The patient's vital signs, pertinent lab work and imaging were reviewed and interpreted as discussed in the ED course. Hospitalization was considered for further testing, treatments, or serial exams/observation. However as patient is well-appearing, has a stable exam, and reassuring studies today, I do not feel that they warrant admission at this time. This plan was discussed with the patient who verbalizes agreement and comfort with this plan and seems reliable and able to return to the Emergency Department with worsening or changing symptoms.          Final Clinical Impression(s) / ED Diagnoses Final diagnoses:  RSV infection  Acute cough    Rx / DC Orders ED Discharge Orders          Ordered    predniSONE (DELTASONE) 20 MG tablet  Daily        10/09/22 2000    benzonatate (TESSALON) 100 MG capsule  Every 8 hours        10/09/22 2000              Renne Crigler, PA-C 10/09/22 2004    Vanetta Mulders, MD 10/10/22 2328

## 2022-10-15 DIAGNOSIS — J205 Acute bronchitis due to respiratory syncytial virus: Secondary | ICD-10-CM | POA: Diagnosis not present

## 2022-10-15 DIAGNOSIS — Z09 Encounter for follow-up examination after completed treatment for conditions other than malignant neoplasm: Secondary | ICD-10-CM | POA: Diagnosis not present

## 2022-10-15 DIAGNOSIS — Z6841 Body Mass Index (BMI) 40.0 and over, adult: Secondary | ICD-10-CM | POA: Diagnosis not present

## 2022-10-15 DIAGNOSIS — G4700 Insomnia, unspecified: Secondary | ICD-10-CM | POA: Diagnosis not present

## 2022-10-15 DIAGNOSIS — M159 Polyosteoarthritis, unspecified: Secondary | ICD-10-CM | POA: Diagnosis not present

## 2022-10-17 DIAGNOSIS — G4700 Insomnia, unspecified: Secondary | ICD-10-CM | POA: Diagnosis not present

## 2022-10-17 DIAGNOSIS — N390 Urinary tract infection, site not specified: Secondary | ICD-10-CM | POA: Diagnosis not present

## 2022-10-29 DIAGNOSIS — H524 Presbyopia: Secondary | ICD-10-CM | POA: Diagnosis not present

## 2022-11-17 DIAGNOSIS — G4730 Sleep apnea, unspecified: Secondary | ICD-10-CM | POA: Diagnosis not present

## 2022-11-17 DIAGNOSIS — Z6841 Body Mass Index (BMI) 40.0 and over, adult: Secondary | ICD-10-CM | POA: Diagnosis not present

## 2022-11-17 DIAGNOSIS — J309 Allergic rhinitis, unspecified: Secondary | ICD-10-CM | POA: Diagnosis not present

## 2022-11-17 DIAGNOSIS — M545 Low back pain, unspecified: Secondary | ICD-10-CM | POA: Diagnosis not present

## 2022-12-18 DIAGNOSIS — E876 Hypokalemia: Secondary | ICD-10-CM | POA: Diagnosis not present

## 2022-12-18 DIAGNOSIS — E559 Vitamin D deficiency, unspecified: Secondary | ICD-10-CM | POA: Diagnosis not present

## 2022-12-18 DIAGNOSIS — Z79899 Other long term (current) drug therapy: Secondary | ICD-10-CM | POA: Diagnosis not present

## 2022-12-18 DIAGNOSIS — M797 Fibromyalgia: Secondary | ICD-10-CM | POA: Diagnosis not present

## 2022-12-18 DIAGNOSIS — E785 Hyperlipidemia, unspecified: Secondary | ICD-10-CM | POA: Diagnosis not present

## 2022-12-18 DIAGNOSIS — Z6841 Body Mass Index (BMI) 40.0 and over, adult: Secondary | ICD-10-CM | POA: Diagnosis not present

## 2022-12-18 DIAGNOSIS — J45909 Unspecified asthma, uncomplicated: Secondary | ICD-10-CM | POA: Diagnosis not present

## 2023-01-08 ENCOUNTER — Telehealth: Payer: Self-pay | Admitting: Neurology

## 2023-01-08 NOTE — Telephone Encounter (Signed)
New message    Patient is asking for a call back to from the nurse regarding driving privileges

## 2023-01-08 NOTE — Telephone Encounter (Signed)
Called and spoke with patient. I advised her that we have not seen her in 3 years so she would need to get a new referral to our office. Once we got that referral then we could look at it and get her schedule if it is something that we see.

## 2023-01-19 DIAGNOSIS — F419 Anxiety disorder, unspecified: Secondary | ICD-10-CM | POA: Diagnosis not present

## 2023-01-19 DIAGNOSIS — R413 Other amnesia: Secondary | ICD-10-CM | POA: Diagnosis not present

## 2023-01-19 DIAGNOSIS — M545 Low back pain, unspecified: Secondary | ICD-10-CM | POA: Diagnosis not present

## 2023-01-19 DIAGNOSIS — E039 Hypothyroidism, unspecified: Secondary | ICD-10-CM | POA: Diagnosis not present

## 2023-01-19 DIAGNOSIS — F330 Major depressive disorder, recurrent, mild: Secondary | ICD-10-CM | POA: Diagnosis not present

## 2023-02-12 DIAGNOSIS — Z9181 History of falling: Secondary | ICD-10-CM | POA: Diagnosis not present

## 2023-02-12 DIAGNOSIS — Z0000 Encounter for general adult medical examination without abnormal findings: Secondary | ICD-10-CM | POA: Diagnosis not present

## 2023-02-13 DIAGNOSIS — M7989 Other specified soft tissue disorders: Secondary | ICD-10-CM | POA: Diagnosis not present

## 2023-02-13 DIAGNOSIS — K219 Gastro-esophageal reflux disease without esophagitis: Secondary | ICD-10-CM | POA: Diagnosis not present

## 2023-02-13 DIAGNOSIS — Z6841 Body Mass Index (BMI) 40.0 and over, adult: Secondary | ICD-10-CM | POA: Diagnosis not present

## 2023-02-13 DIAGNOSIS — R413 Other amnesia: Secondary | ICD-10-CM | POA: Diagnosis not present

## 2023-02-13 DIAGNOSIS — M545 Low back pain, unspecified: Secondary | ICD-10-CM | POA: Diagnosis not present

## 2023-02-13 DIAGNOSIS — G4730 Sleep apnea, unspecified: Secondary | ICD-10-CM | POA: Diagnosis not present

## 2023-02-13 DIAGNOSIS — J309 Allergic rhinitis, unspecified: Secondary | ICD-10-CM | POA: Diagnosis not present

## 2023-02-25 DIAGNOSIS — Z6841 Body Mass Index (BMI) 40.0 and over, adult: Secondary | ICD-10-CM | POA: Diagnosis not present

## 2023-02-25 DIAGNOSIS — J019 Acute sinusitis, unspecified: Secondary | ICD-10-CM | POA: Diagnosis not present

## 2023-02-25 DIAGNOSIS — K649 Unspecified hemorrhoids: Secondary | ICD-10-CM | POA: Diagnosis not present

## 2023-02-26 DIAGNOSIS — J209 Acute bronchitis, unspecified: Secondary | ICD-10-CM | POA: Diagnosis not present

## 2023-03-23 DIAGNOSIS — E876 Hypokalemia: Secondary | ICD-10-CM | POA: Diagnosis not present

## 2023-03-23 DIAGNOSIS — R413 Other amnesia: Secondary | ICD-10-CM | POA: Diagnosis not present

## 2023-03-23 DIAGNOSIS — M797 Fibromyalgia: Secondary | ICD-10-CM | POA: Diagnosis not present

## 2023-03-23 DIAGNOSIS — M545 Low back pain, unspecified: Secondary | ICD-10-CM | POA: Diagnosis not present

## 2023-03-23 DIAGNOSIS — Z6841 Body Mass Index (BMI) 40.0 and over, adult: Secondary | ICD-10-CM | POA: Diagnosis not present

## 2023-03-23 DIAGNOSIS — K219 Gastro-esophageal reflux disease without esophagitis: Secondary | ICD-10-CM | POA: Diagnosis not present

## 2023-03-23 DIAGNOSIS — J309 Allergic rhinitis, unspecified: Secondary | ICD-10-CM | POA: Diagnosis not present

## 2023-03-23 DIAGNOSIS — E785 Hyperlipidemia, unspecified: Secondary | ICD-10-CM | POA: Diagnosis not present

## 2023-04-22 DIAGNOSIS — F330 Major depressive disorder, recurrent, mild: Secondary | ICD-10-CM | POA: Diagnosis not present

## 2023-04-22 DIAGNOSIS — Z6841 Body Mass Index (BMI) 40.0 and over, adult: Secondary | ICD-10-CM | POA: Diagnosis not present

## 2023-04-22 DIAGNOSIS — M545 Low back pain, unspecified: Secondary | ICD-10-CM | POA: Diagnosis not present

## 2023-04-22 DIAGNOSIS — R413 Other amnesia: Secondary | ICD-10-CM | POA: Diagnosis not present

## 2023-05-25 DIAGNOSIS — J019 Acute sinusitis, unspecified: Secondary | ICD-10-CM | POA: Diagnosis not present

## 2023-05-25 DIAGNOSIS — N3281 Overactive bladder: Secondary | ICD-10-CM | POA: Diagnosis not present

## 2023-05-25 DIAGNOSIS — R600 Localized edema: Secondary | ICD-10-CM | POA: Diagnosis not present

## 2023-05-25 DIAGNOSIS — M159 Polyosteoarthritis, unspecified: Secondary | ICD-10-CM | POA: Diagnosis not present

## 2023-06-24 DIAGNOSIS — E785 Hyperlipidemia, unspecified: Secondary | ICD-10-CM | POA: Diagnosis not present

## 2023-06-24 DIAGNOSIS — Z79899 Other long term (current) drug therapy: Secondary | ICD-10-CM | POA: Diagnosis not present

## 2023-06-24 DIAGNOSIS — K219 Gastro-esophageal reflux disease without esophagitis: Secondary | ICD-10-CM | POA: Diagnosis not present

## 2023-06-24 DIAGNOSIS — E876 Hypokalemia: Secondary | ICD-10-CM | POA: Diagnosis not present

## 2023-06-24 DIAGNOSIS — J309 Allergic rhinitis, unspecified: Secondary | ICD-10-CM | POA: Diagnosis not present

## 2023-06-24 DIAGNOSIS — M545 Low back pain, unspecified: Secondary | ICD-10-CM | POA: Diagnosis not present

## 2023-06-24 DIAGNOSIS — Z6841 Body Mass Index (BMI) 40.0 and over, adult: Secondary | ICD-10-CM | POA: Diagnosis not present

## 2023-06-24 DIAGNOSIS — M797 Fibromyalgia: Secondary | ICD-10-CM | POA: Diagnosis not present

## 2023-06-24 DIAGNOSIS — R413 Other amnesia: Secondary | ICD-10-CM | POA: Diagnosis not present

## 2023-07-02 ENCOUNTER — Emergency Department (HOSPITAL_BASED_OUTPATIENT_CLINIC_OR_DEPARTMENT_OTHER)
Admission: EM | Admit: 2023-07-02 | Discharge: 2023-07-02 | Disposition: A | Discharge status: Home / Self Care | Payer: Medicare HMO | Attending: Emergency Medicine | Admitting: Emergency Medicine

## 2023-07-02 ENCOUNTER — Other Ambulatory Visit: Payer: Self-pay

## 2023-07-02 ENCOUNTER — Encounter (HOSPITAL_BASED_OUTPATIENT_CLINIC_OR_DEPARTMENT_OTHER): Payer: Self-pay | Admitting: Emergency Medicine

## 2023-07-02 DIAGNOSIS — J45909 Unspecified asthma, uncomplicated: Secondary | ICD-10-CM | POA: Diagnosis not present

## 2023-07-02 DIAGNOSIS — U071 COVID-19: Secondary | ICD-10-CM | POA: Diagnosis not present

## 2023-07-02 DIAGNOSIS — Z6841 Body Mass Index (BMI) 40.0 and over, adult: Secondary | ICD-10-CM | POA: Diagnosis not present

## 2023-07-02 DIAGNOSIS — Z7951 Long term (current) use of inhaled steroids: Secondary | ICD-10-CM | POA: Insufficient documentation

## 2023-07-02 DIAGNOSIS — Z7982 Long term (current) use of aspirin: Secondary | ICD-10-CM | POA: Insufficient documentation

## 2023-07-02 DIAGNOSIS — J209 Acute bronchitis, unspecified: Secondary | ICD-10-CM | POA: Diagnosis not present

## 2023-07-02 DIAGNOSIS — R059 Cough, unspecified: Secondary | ICD-10-CM | POA: Diagnosis present

## 2023-07-02 LAB — RESP PANEL BY RT-PCR (RSV, FLU A&B, COVID)  RVPGX2
Influenza A by PCR: NEGATIVE
Influenza B by PCR: NEGATIVE
Resp Syncytial Virus by PCR: NEGATIVE
SARS Coronavirus 2 by RT PCR: POSITIVE — AB

## 2023-07-02 MED ORDER — BENZONATATE 100 MG PO CAPS: 100.0000 mg | ORAL_CAPSULE | Freq: Three times a day (TID) | ORAL | 0 refills | Status: DC

## 2023-07-02 NOTE — ED Triage Notes (Signed)
Cough and congestion for a few days. Had a virtual visit. She took her BP and said systolic was 70's but unsure if it was an accurate reading so provider told her to come to the ED

## 2023-07-02 NOTE — Discharge Instructions (Signed)
You were seen in the ER today for concerns of a cough and congestion. You tested positive for COVID-19. Your lungs sound normal and your ears are not actively infected. You may take the Azithromycin you were prescirbed to reduce the risk of pulmonary infection. If you develop severe chest pain or shortness of breath, please return to the ER.

## 2023-07-02 NOTE — ED Provider Notes (Signed)
[facility] EMERGENCY DEPARTMENT AT [facility] Provider Note   CSN: [id number] Arrival date & time: 07/02/23  1416     History Chief Complaint  Patient presents with   Cough    Kelly Hansen is a 57 y.o. female.  Patient past history significant for asthma, morbid obesity, and acid reflux presents the emergency department today with concerns of cough and congestion for the last few days.  Reports that she had a virtual visit with a provider and was advised to come to the emergency department as her blood pressure had a reading of 70 systolic.  Endorses some subjective fever and chills but states that this has been improving.  Recently in contact with father who was positive for COVID-19.  Denies any chest pain or shortness of breath at this time.   Cough      Home Medications Prior to Admission medications   Medication Sig Start Date End Date Taking? Authorizing Provider  benzonatate (TESSALON) 100 MG capsule Take 1 capsule (100 mg total) by mouth every 8 (eight) hours. 07/02/23  Yes Smitty Knudsen, PA-C  albuterol (VENTOLIN HFA) 108 (90 Base) MCG/ACT inhaler Inhale 2 puffs by mouth into the lungs 3 times a day as needed Patient taking differently: Inhale 2 puffs into the lungs 3 (three) times daily as needed for shortness of breath or wheezing. 12/25/20     aspirin EC 81 MG tablet Take 1 tablet (81 mg total) by mouth daily. Swallow whole. 02/12/21   Revankar, Aundra Dubin, MD  baclofen (LIORESAL) 20 MG tablet Take 20 mg by mouth 2 (two) times daily. 05/26/19   [provider]  benzonatate (TESSALON) 100 MG capsule Take 1 capsule (100 mg total) by mouth every 8 (eight) hours. 10/09/22   Renne Crigler, PA-C  donepezil (ARICEPT) 10 MG tablet Take 10 mg by mouth at bedtime. 06/01/19   [provider]  doxycycline (VIBRAMYCIN) 100 MG capsule Take one capsule by mouth twice daily for 7 days. 04/25/21     DULoxetine (CYMBALTA) 60 MG capsule Take 60 mg by mouth in the  morning. 05/26/19   [provider]  ergocalciferol (VITAMIN D2) 1.25 MG (50000 UT) capsule Take 1 capsule by mouth once a week. 05/13/21     famotidine (PEPCID) 20 MG tablet Take 1 (one) tablet by mouth two times daily. 06/03/21     Fluticasone-Salmeterol (ADVAIR) 250-50 MCG/DOSE AEPB Inhale 2 puffs by mouth into the lungs 2 times a day Patient taking differently: Inhale 1 puff into the lungs 2 (two) times daily as needed (respiratory issues). 12/25/20     furosemide (LASIX) 20 MG tablet Take 40 mg by mouth 2 (two) times daily.     [provider]  gabapentin (NEURONTIN) 300 MG capsule Take 600 mg by mouth 3 (three) times daily.  05/26/19   [provider]  levothyroxine (SYNTHROID) 50 MCG tablet Take 50 mcg by mouth daily before breakfast. 04/15/19   [provider]  memantine (NAMENDA) 10 MG tablet Take 10 mg by mouth 2 (two) times daily.    [provider]  montelukast (SINGULAIR) 10 MG tablet Take 1 tablet by mouth at bedtime. 04/08/21     nitroGLYCERIN (NITROSTAT) 0.4 MG SL tablet Take 1 (one) Tablet as needed for chest pain-If not better in 5 minutes repeat x 2--If still not better--seek attention at ER/ Call 911 Patient taking differently: Place 0.4 mg under the tongue every 5 (five) minutes x 3 doses as needed for chest  pain. 02/08/21     olopatadine (PATANOL) 0.1 % ophthalmic solution Place 1 drop into each eye as directed 03/13/21     oxybutynin (DITROPAN-XL) 10 MG 24 hr tablet Take 10 mg by mouth at bedtime.    [provider]  pantoprazole (PROTONIX) 40 MG tablet Take 40 mg by mouth in the morning.    [provider]  potassium chloride (KLOR-CON) 10 MEQ tablet Take 10 mEq by mouth 2 (two) times daily.    [provider]  pramipexole (MIRAPEX) 0.125 MG tablet Take 0.125 mg by mouth 2 (two) times daily. 05/26/19   [provider]  predniSONE (DELTASONE) 20 MG tablet Take 2 tablets (40 mg total) by mouth daily. 10/09/22    Renne Crigler, PA-C  pregabalin (LYRICA) 100 MG capsule Take 1 capsule by mouth two times daily as needed Patient taking differently: Take 100 mg by mouth daily. 04/03/21     pregabalin (LYRICA) 100 MG capsule Take 1  Capsule by mouth two times daily as needed 04/30/21     pregabalin (LYRICA) 100 MG capsule Take 1 (one) capsule by mouth two times daily as needed 06/03/21     rosuvastatin (CRESTOR) 10 MG tablet Take 10 mg by mouth at bedtime. 05/24/19   [provider]  rosuvastatin (CRESTOR) 10 MG tablet Take 1 tablet by mouth at bedtime. 04/25/21     zolpidem (AMBIEN) 10 MG tablet Take 1 tablet by mouth at bedtime 06/03/21         Allergies    Penicillins and Sulfa antibiotics    Review of Systems   Review of Systems  Respiratory:  Positive for cough.   All other systems reviewed and are negative.   Physical Exam Updated Vital Signs BP (!) 145/89   Pulse 85   Temp 97.6 F (36.4 C) (Oral)   Resp 20   Ht 5\' 2"  (1.575 m)   Wt 107 kg   SpO2 98%   BMI 43.16 kg/m  Physical Exam Vitals and nursing note reviewed.  Constitutional:      General: She is not in acute distress.    Appearance: She is well-developed.  HENT:     Head: Normocephalic and atraumatic.  Eyes:     Conjunctiva/sclera: Conjunctivae normal.  Cardiovascular:     Rate and Rhythm: Normal rate and regular rhythm.     Heart sounds: No murmur heard. Pulmonary:     Effort: Pulmonary effort is normal. No respiratory distress.     Breath sounds: Normal breath sounds. No wheezing or rales.  Abdominal:     Palpations: Abdomen is soft.     Tenderness: There is no abdominal tenderness.  Musculoskeletal:        General: No swelling.     Cervical back: Neck supple.  Skin:    General: Skin is warm and dry.     Capillary Refill: Capillary refill takes less than 2 seconds.  Neurological:     Mental Status: She is alert.  Psychiatric:        Mood and Affect: Mood normal.     ED Results / Procedures /  Treatments   Labs (all labs ordered are listed, but only abnormal results are displayed) Labs Reviewed  RESP PANEL BY RT-PCR (RSV, FLU A&B, COVID)  RVPGX2 - Abnormal; Notable for the following components:      Result Value   SARS Coronavirus 2 by RT PCR POSITIVE (*)    All other components within normal limits  EKG None  Radiology No results found.  Procedures Procedures   Medications Ordered in ED Medications - No data to display  ED Course/ Medical Decision Making/ A&P                               Medical Decision Making Risk Prescription drug management.   This patient presents to the ED for concern of cough.  Differential diagnosis includes viral URI, pneumonia, COVID-19, asthma exacerbation   Lab Tests:  I Ordered, and personally interpreted labs.  The pertinent results include: COVID-19 positive    Problem List / ED Course:  Past history significant for obesity and asthma presents the emergency department concerns of cough.  Reports that she had sick contact with her father who was positive for COVID-19 about 10 days ago.  Endorse that she has had fever, cough and congestion for approximately 4 to 5 days at this point.  Was advised to come to the emergency department as she reportedly had a virtual visit with a systolic blood pressure reading in the 70s.  Denies any weakness, dizziness, lightheadedness. Patient is positive for COVID-19.  On physical exam, lungs are clear to auscultation bilaterally.  No wheezing, rales, or rhonchi.  Patient does not appear to be any signs of acute respiratory distress.  Given findings, believes the patient is likely safe for outpatient management of symptoms.  No acute indication for antibiotic therapy at this time.  Patient is also outside the treatment window for antiviral therapy.  Instead advised management with over-the-counter medications such as Mucinex for congestion, Tylenol and ibuprofen for fever or pain.  A prescription  for Tessalon sent to patient's pharmacy for control of cough.  Encourage patient return to the emergency department if she has any acute worsening or decline in symptoms.  Final Clinical Impression(s) / ED Diagnoses Final diagnoses:  COVID-19    Rx / DC Orders ED Discharge Orders          Ordered    benzonatate (TESSALON) 100 MG capsule  Every 8 hours        07/02/23 1739              Smitty Knudsen, PA-C 07/02/23 2154    Royanne Foots, DO 07/06/23 [phone]

## 2023-07-06 NOTE — Plan of Care (Signed)
[facility] Tonsillectomy/Adenoidectomy, Postoperative PEDS care plan entered in error.

## 2023-07-08 DIAGNOSIS — F330 Major depressive disorder, recurrent, mild: Secondary | ICD-10-CM | POA: Diagnosis not present

## 2023-07-08 DIAGNOSIS — Z09 Encounter for follow-up examination after completed treatment for conditions other than malignant neoplasm: Secondary | ICD-10-CM | POA: Diagnosis not present

## 2023-07-08 DIAGNOSIS — Z6841 Body Mass Index (BMI) 40.0 and over, adult: Secondary | ICD-10-CM | POA: Diagnosis not present

## 2023-07-08 DIAGNOSIS — Z79899 Other long term (current) drug therapy: Secondary | ICD-10-CM | POA: Diagnosis not present

## 2023-07-08 DIAGNOSIS — F419 Anxiety disorder, unspecified: Secondary | ICD-10-CM | POA: Diagnosis not present

## 2023-07-08 DIAGNOSIS — R413 Other amnesia: Secondary | ICD-10-CM | POA: Diagnosis not present

## 2023-07-08 DIAGNOSIS — M545 Low back pain, unspecified: Secondary | ICD-10-CM | POA: Diagnosis not present

## 2023-07-14 ENCOUNTER — Encounter: Payer: Self-pay | Admitting: Obstetrics and Gynecology

## 2023-07-14 ENCOUNTER — Ambulatory Visit: Payer: Medicare HMO | Admitting: Obstetrics and Gynecology

## 2023-07-14 VITALS — BP 106/53 | HR 68 | Ht 62.0 in | Wt 231.6 lb

## 2023-07-14 DIAGNOSIS — N393 Stress incontinence (female) (male): Secondary | ICD-10-CM

## 2023-07-14 DIAGNOSIS — R350 Frequency of micturition: Secondary | ICD-10-CM

## 2023-07-14 DIAGNOSIS — N3281 Overactive bladder: Secondary | ICD-10-CM

## 2023-07-14 DIAGNOSIS — R152 Fecal urgency: Secondary | ICD-10-CM

## 2023-07-14 DIAGNOSIS — R159 Full incontinence of feces: Secondary | ICD-10-CM

## 2023-07-14 DIAGNOSIS — M6289 Other specified disorders of muscle: Secondary | ICD-10-CM | POA: Diagnosis not present

## 2023-07-14 LAB — POCT URINALYSIS DIPSTICK
Bilirubin, UA: NEGATIVE
Blood, UA: NEGATIVE
Glucose, UA: NEGATIVE
Ketones, UA: NEGATIVE
Leukocytes, UA: NEGATIVE
Nitrite, UA: NEGATIVE
Protein, UA: POSITIVE — AB
Spec Grav, UA: 1.025 (ref 1.010–1.025)
Urobilinogen, UA: 0.2 U/dL
pH, UA: 6 (ref 5.0–8.0)

## 2023-07-14 MED ORDER — VIBEGRON 75 MG PO TABS: 75.0000 mg | ORAL_TABLET | Freq: Every day | ORAL | 5 refills | Status: DC

## 2023-07-14 NOTE — Patient Instructions (Addendum)
Today we talked about ways to manage bladder urgency such as altering your diet to avoid irritative beverages and foods (bladder diet) as well as attempting to decrease stress and other exacerbating factors.  You can also chew a plain Tums 1-3 times per day to make your urine less acidic, especially if you have eating/drinking acidic things.   There is a website with helpful information for people with bladder irritation, called the IC Network at https://www.ic-network.com. This website has more information about a healthy bladder diet and patient forums for support.  The Most Bothersome Foods* The Least Bothersome Foods*  Coffee - Regular & Decaf Tea - caffeinated Carbonated beverages - cola, non-colas, diet & caffeine-free Alcohols - Beer, Red Wine, White Wine, [address] - Grapefruit, [place], Orange, [employer] - Cranberry, Grapefruit, Orange, Pineapple Vegetables - Tomato & Tomato Products Flavor Enhancers - Hot peppers, Spicy foods, Chili, Horseradish, Vinegar, Monosodium glutamate (MSG) Artificial Sweeteners - NutraSweet, Sweet 'N Low, Equal (sweetener), Saccharin Ethnic foods - [place], [place], [place] food [employer] - low-fat & whole Fruits - Bananas, Blueberries, Honeydew melon, Pears, Raisins, Watermelon Vegetables - Broccoli, [address] Sprouts, Time, Carrots, Cauliflower, [place], Cucumber, Mushrooms, Peas, Radishes, Squash, Zucchini, White potatoes, Sweet potatoes & yams Poultry - Chicken, Eggs, [place], [employer] - Beef, Diplomatic Services operational officer, Lamb Seafood - Shrimp, [place] fish, Salmon Grains - Oat, Rice Snacks - Pretzels, Popcorn  *Lenward Chancellor et al. Diet and its role in interstitial cystitis/bladder pain syndrome (IC/BPS) and comorbid conditions. [employer]. [employer]U Int. 2012 Jan 11.     Stop the Oxybutynin and start Gemtesa 75mg  daily.   I will send you some videos on pelvic floor stretching. Please try to do stretching at least 3 times a week and consider getting a pelvic  wand.   Consider adding in metamucil if you continue to have loose stools.

## 2023-07-14 NOTE — Progress Notes (Signed)
[facility] New Patient Evaluation and Consultation  Referring Provider: Lucianne Lei, MD PCP: Lucianne Lei, MD Date of Service: 07/14/2023  SUBJECTIVE Chief Complaint: New Patient (Initial Visit) Kelly Hansen is a 57 y.o. female is here for OAB.)  History of Present Illness: Kelly Hansen is a 57 y.o. White or Caucasian female seen in consultation at the request of Dr. Mathis Bud for evaluation of Bladder leakage.    Review of records significant for: Significant low back pain   Urinary Symptoms: Leaks urine with cough/ sneeze, exercise, lifting, going from sitting to standing, during sex, with a full bladder, and without sensation Leaks 4-8 time(s) per days.  Pad use: 2 liners/ mini-pads per day.   Patient is bothered by UI symptoms.  Day time voids 10.  Nocturia: 2-3 times per night to void. Voiding dysfunction:  does not empty bladder well.  Patient does not use a catheter to empty bladder.  When urinating, patient feels a weak stream Drinks: 4-5 cups daily Coffee, 3-6 cups Water, 1 cup Gingerale per day  UTIs: 1 UTI's in the last year.   Denies history of blood in urine, kidney or bladder stones, pyelonephritis, bladder cancer, and kidney cancer     Pelvic Organ Prolapse Symptoms:                  Patient Denies a feeling of a bulge the vaginal area.  Bowel Symptom: Bowel movements: 1-2 time(s) per day Stool consistency: soft  Straining: yes.  Splinting: yes.  Incomplete evacuation: yes.  Patient Admits to accidental bowel leakage / fecal incontinence  Occurs: Random  Consistency with leakage: soft  or liquid Bowel regimen: none HM Colonoscopy          Colonoscopy (Every 10 Years) Next due on 07/18/2031    07/17/2021  Outside Claim: PR COLONOSCOPY W/BIOPSY SINGLE/MULTIPLE   Only the first 1 history entries have been loaded, but more history exists.            Sexual Function Sexually active: yes.  Sexual orientation: Straight Pain with  sex: No  Pelvic Pain Denies pelvic pain    Past Medical History:  Past Medical History:  Diagnosis Date   Acid reflux    Asthma    CIN III with severe dysplasia 10/06/2016   Formatting of this note might be different from the original. Reports [facility] between 1991-2004 for abnormal Pap followed by Pap q17mos then q34mos She then had TVH in 2004 with Dr. Thamas Jaegers Needs Pap smears for 20-25y post-hysterectomy due to presumed h/o CIN 2-3   Degeneration of lumbar intervertebral disc 03/30/2020   Depression    Increased body mass index 03/30/2020   Thyroid disease      Past Surgical History:   Past Surgical History:  Procedure Laterality Date   ABDOMINAL HYSTERECTOMY     APPENDECTOMY     LEFT HEART CATH AND CORONARY ANGIOGRAPHY N/A 02/13/2021   Procedure: LEFT HEART CATH AND CORONARY ANGIOGRAPHY;  Surgeon: Tonny Bollman, MD;  Location: [facility] INVASIVE CV LAB;  Service: Cardiovascular;  Laterality: N/A;     Past OB/GYN History: [id number] Vaginal deliveries: 3,  Forceps/ Vacuum deliveries: Yes with her second child, Cesarean section: 0 Menopausal: Yes Contraception: Abdominal Hysterectomy . Any history of abnormal pap smears: yes.   Medications: Patient has a current medication list which includes the following prescription(s): albuterol, baclofen, benzonatate, donepezil, doxycycline, duloxetine, ergocalciferol, famotidine, fluticasone-salmeterol, furosemide, gabapentin, levothyroxine, meclizine, meloxicam, memantine, montelukast, nitroglycerin, olopatadine, pantoprazole, potassium chloride, pramipexole,  pregabalin, rosuvastatin, tizanidine, trazodone, and vibegron.   Allergies: Patient is allergic to penicillins and sulfa antibiotics.   Social History:  Social History   Tobacco Use   Smoking status: Former    Current packs/day: 0.00    Types: Cigarettes    Quit date: 03/10/2011    Years since quitting: 12.3   Smokeless tobacco: Never  Vaping Use   Vaping status: Never Used  Substance  Use Topics   Alcohol use: No   Drug use: No    Relationship status: married Patient lives with husband and children.   Patient is not employed. Regular exercise: No History of abuse: No  Family History:   Family History  Problem Relation Age of Onset   Dementia Mother    Dementia Sister    Dementia Maternal Aunt    Dementia Maternal Uncle      Review of Systems: Review of Systems  Constitutional:  Negative for chills and fever.  Respiratory:  Negative for cough and shortness of breath.   Cardiovascular:  Positive for leg swelling. Negative for chest pain and palpitations.  Gastrointestinal:  Negative for abdominal pain, blood in stool, constipation and diarrhea.  Skin:  Negative for rash.  Neurological:  Negative for weakness.  Endo/Heme/Allergies:  Bruises/bleeds easily.  Psychiatric/Behavioral:  Positive for depression. Negative for suicidal ideas.      OBJECTIVE Physical Exam: Vitals:   07/14/23 0836  BP: (!) 106/53  Pulse: 68  Weight: 231 lb 9.6 oz (105.1 kg)  Height: 5\' 2"  (1.575 m)    Physical Exam Vitals reviewed. Exam conducted with a chaperone present.  Constitutional:      Appearance: Normal appearance.  Pulmonary:     Effort: Pulmonary effort is normal.  Abdominal:     Palpations: Abdomen is soft.  Skin:    General: Skin is warm and dry.  Neurological:     General: No focal deficit present.     Mental Status: She is alert and oriented to person, place, and time.  Psychiatric:        Mood and Affect: Mood normal.        Behavior: Behavior normal. Behavior is cooperative.        Thought Content: Thought content normal.      GU / Detailed Urogynecologic Evaluation:  Pelvic Exam: Normal external female genitalia; Bartholin's and Skene's glands normal in appearance; urethral meatus normal in appearance, no urethral masses or discharge.   CST: positive   s/p hysterectomy: Speculum exam reveals normal vaginal mucosa without  atrophy and normal  vaginal cuff.  Adnexa normal adnexa.    With apex supported, anterior compartment defect was reduced  Pelvic floor strength II/V  Pelvic floor musculature: Right levator non-tender, Right obturator non-tender, Left levator tender, Left obturator tender  POP-Q:   POP-Q  -2                                            Aa   -2                                           Ba  -9.5  C   2.5                                            Gh  2                                            Pb  10                                            tvl   -2.5                                            Ap  -2.5                                            Bp                                                 D      Rectal Exam:  Normal external exam.  Post-Void Residual (PVR) by Bladder Scan: In order to evaluate bladder emptying, we discussed obtaining a postvoid residual and patient agreed to this procedure.  Procedure: The ultrasound unit was placed on the patient's abdomen in the suprapubic region after the patient had voided.    Laboratory Results: Lab Results  Component Value Date   COLORU yellow 07/14/2023   CLARITYU clear 07/14/2023   GLUCOSEUR Negative 07/14/2023   BILIRUBINUR negative 07/14/2023   KETONESU negative 07/14/2023   SPECGRAV 1.025 07/14/2023   RBCUR negative 07/14/2023   PHUR 6.0 07/14/2023   PROTEINUR Positive (A) 07/14/2023   UROBILINOGEN 0.2 07/14/2023   LEUKOCYTESUR Negative 07/14/2023    Lab Results  Component Value Date   CREATININE 1.11 (H) 02/12/2021   CREATININE 0.99 10/04/2019   CREATININE 1.01 (H) 12/02/2015     Lab Results  Component Value Date   HGB 14.5 02/12/2021     ASSESSMENT AND PLAN Ms. Haghighi is a 57 y.o. with:  1. OAB (overactive bladder)   2. Urinary frequency   3. SUI (stress urinary incontinence, female)   4. Pelvic floor dysfunction in female   5. Incontinence of feces with fecal  urgency    We discussed the symptoms of overactive bladder (OAB), which include urinary urgency, urinary frequency, nocturia, with or without urge incontinence.  While we do not know the exact etiology of OAB, several treatment options exist. We discussed management including behavioral therapy (decreasing bladder irritants, urge suppression strategies, timed voids, bladder retraining), physical therapy, medication. For anticholinergic medications, we discussed the potential side effects of anticholinergics including dry eyes, dry mouth, constipation, cognitive impairment and urinary retention.For Beta-3 agonist medication, we discussed the potential side effect of elevated blood pressure which is more likely to occur in individuals with uncontrolled hypertension. She  has been on Oxybutynin and was suffering from brain fog and is actively on memory medication. Would not start another anticholinergic with this in mind.  Will start patient on Gemtesa 75mg  daily as it is estimated to be covered by her insurance.  Patient had positive CST on exam. We discussed options for treatment including pessary, urethral bulking and sling. She reports she would like to do a sling as she would like something permanent.  Patient is located in [place] which is not close for pelvic floor PT. While I believe she would benefit greatly from pelvic floor PT, we discussed that she can start some stretching for her obturator muscles and her glutes which may help some of her back and hip pain. I sent her some instructional videos of this and encouraged her to try the stretches at least three times a week.  For patient's FI we discussed incorporating in some fiber to assist in stool bulking. She reports it is intermittent in nature of her losing the stool. She would not be a bad candidate for SNM if needed moving forward but with all her back pain would prefer to reduce surgical interventions.   Patient to follow up with Dr. Olena Leatherwood for  medication follow up and surgical discussion for sling.     Selmer Dominion, NP

## 2023-07-29 DIAGNOSIS — N390 Urinary tract infection, site not specified: Secondary | ICD-10-CM | POA: Diagnosis not present

## 2023-07-29 DIAGNOSIS — R031 Nonspecific low blood-pressure reading: Secondary | ICD-10-CM | POA: Diagnosis not present

## 2023-07-29 DIAGNOSIS — M545 Low back pain, unspecified: Secondary | ICD-10-CM | POA: Diagnosis not present

## 2023-08-04 DIAGNOSIS — Z87440 Personal history of urinary (tract) infections: Secondary | ICD-10-CM | POA: Diagnosis not present

## 2023-08-04 DIAGNOSIS — E7800 Pure hypercholesterolemia, unspecified: Secondary | ICD-10-CM | POA: Diagnosis not present

## 2023-08-04 DIAGNOSIS — F0390 Unspecified dementia without behavioral disturbance: Secondary | ICD-10-CM | POA: Diagnosis not present

## 2023-08-04 DIAGNOSIS — I10 Essential (primary) hypertension: Secondary | ICD-10-CM | POA: Diagnosis not present

## 2023-08-04 DIAGNOSIS — Z79899 Other long term (current) drug therapy: Secondary | ICD-10-CM | POA: Diagnosis not present

## 2023-08-04 DIAGNOSIS — E079 Disorder of thyroid, unspecified: Secondary | ICD-10-CM | POA: Diagnosis not present

## 2023-08-04 DIAGNOSIS — R079 Chest pain, unspecified: Secondary | ICD-10-CM | POA: Diagnosis not present

## 2023-08-04 DIAGNOSIS — E039 Hypothyroidism, unspecified: Secondary | ICD-10-CM | POA: Diagnosis not present

## 2023-08-04 DIAGNOSIS — I959 Hypotension, unspecified: Secondary | ICD-10-CM | POA: Diagnosis not present

## 2023-08-04 DIAGNOSIS — K219 Gastro-esophageal reflux disease without esophagitis: Secondary | ICD-10-CM | POA: Diagnosis not present

## 2023-08-05 DIAGNOSIS — I998 Other disorder of circulatory system: Secondary | ICD-10-CM | POA: Diagnosis not present

## 2023-08-05 DIAGNOSIS — E079 Disorder of thyroid, unspecified: Secondary | ICD-10-CM | POA: Diagnosis not present

## 2023-08-19 ENCOUNTER — Encounter: Payer: Self-pay | Admitting: *Deleted

## 2023-08-19 DIAGNOSIS — E785 Hyperlipidemia, unspecified: Secondary | ICD-10-CM | POA: Insufficient documentation

## 2023-08-19 DIAGNOSIS — K219 Gastro-esophageal reflux disease without esophagitis: Secondary | ICD-10-CM | POA: Insufficient documentation

## 2023-08-19 DIAGNOSIS — G4700 Insomnia, unspecified: Secondary | ICD-10-CM | POA: Insufficient documentation

## 2023-08-19 DIAGNOSIS — K429 Umbilical hernia without obstruction or gangrene: Secondary | ICD-10-CM | POA: Insufficient documentation

## 2023-08-19 DIAGNOSIS — G629 Polyneuropathy, unspecified: Secondary | ICD-10-CM | POA: Insufficient documentation

## 2023-08-19 DIAGNOSIS — M130 Polyarthritis, unspecified: Secondary | ICD-10-CM | POA: Insufficient documentation

## 2023-08-19 DIAGNOSIS — M797 Fibromyalgia: Secondary | ICD-10-CM | POA: Insufficient documentation

## 2023-08-19 DIAGNOSIS — E039 Hypothyroidism, unspecified: Secondary | ICD-10-CM | POA: Insufficient documentation

## 2023-08-19 DIAGNOSIS — E559 Vitamin D deficiency, unspecified: Secondary | ICD-10-CM | POA: Insufficient documentation

## 2023-08-19 DIAGNOSIS — I700 Atherosclerosis of aorta: Secondary | ICD-10-CM | POA: Insufficient documentation

## 2023-08-20 ENCOUNTER — Ambulatory Visit: Payer: Medicare HMO

## 2023-08-20 VITALS — BP 118/60 | HR 63 | Ht 60.0 in | Wt 238.0 lb

## 2023-08-20 DIAGNOSIS — I2510 Atherosclerotic heart disease of native coronary artery without angina pectoris: Secondary | ICD-10-CM | POA: Diagnosis not present

## 2023-08-20 DIAGNOSIS — I959 Hypotension, unspecified: Secondary | ICD-10-CM | POA: Insufficient documentation

## 2023-08-20 DIAGNOSIS — E079 Disorder of thyroid, unspecified: Secondary | ICD-10-CM | POA: Diagnosis not present

## 2023-08-20 DIAGNOSIS — E782 Mixed hyperlipidemia: Secondary | ICD-10-CM | POA: Diagnosis not present

## 2023-08-20 DIAGNOSIS — I9589 Other hypotension: Secondary | ICD-10-CM

## 2023-08-20 DIAGNOSIS — E042 Nontoxic multinodular goiter: Secondary | ICD-10-CM | POA: Diagnosis not present

## 2023-08-20 HISTORY — DX: Hypotension, unspecified: I95.9

## 2023-08-20 NOTE — Assessment & Plan Note (Addendum)
Mild nonobstructive coronary artery disease on prior cath from 2022. Remains asymptomatic. Currently due to easy bruisability, okay to hold off on aspirin. Continue with risk factor reduction. Target dietary modifications and weight loss.  Continue with lipid-lowering therapy using statins as below.

## 2023-08-20 NOTE — Assessment & Plan Note (Signed)
Intermittent symptomatic hypotension. No obvious triggers. Not on any blood pressure lowering medications. She self discontinued Lasix over a month ago.  Encouraged her to continue hydrating herself well and taking salty foods if needed to keep the blood pressures up on the days when her blood pressures are trending down.  Will obtain a transthoracic echocardiogram to assess cardiac structure and function.  In the setting of her left thyroid mass, there is possibility of mass effect from this involving carotid system causing blood pressure changes. Continue management for the thyroid mass as per PCP.  Advised to take precautions of new onset of symptoms while he is sitting down or laying down and having her feet brought elevated to avoid persistent hypotension. Advised to keep herself well-hydrated.

## 2023-08-20 NOTE — Progress Notes (Signed)
Cardiology Consultation:    Date:  08/20/2023   ID:  Kelly Hansen, DOB Jan 01, 1966, MRN [id number]  PCP:  Kelly Lei, MD  Cardiologist:  Kelly Corporal Redding Cloe, MD   Referring MD: Kelly Lei, MD   No chief complaint on file.    ASSESSMENT AND PLAN:   Kelly Hansen 57 year old woman with history of mild nonobstructive coronary artery disease based on cath in June 2022, morbid obesity, dyslipidemia, GERD, hypothyroidism now with incidental finding of left thyroid mass pending further evaluation, sent in for evaluation of recurrent symptomatic hypotension.  Problem List Items Addressed This Visit     Mild CAD - Primary    Mild nonobstructive coronary artery disease on prior cath from 2022. Remains asymptomatic. Currently due to easy bruisability, okay to hold off on aspirin. Continue with risk factor reduction. Target dietary modifications and weight loss.  Continue with lipid-lowering therapy using statins as below.       Relevant Medications   furosemide (LASIX) 20 MG tablet   Other Relevant Orders   EKG 12-Lead (Completed)   ECHOCARDIOGRAM COMPLETE   Hyperlipidemia    Last lipid panel from April 2024 appears optimal. Continue Crestor 10 mg once daily in the setting of mild nonobstructive coronary artery disease.       Relevant Medications   furosemide (LASIX) 20 MG tablet   Hypotension    Intermittent symptomatic hypotension. No obvious triggers. Not on any blood pressure lowering medications. She self discontinued Lasix over a month ago.  Encouraged her to continue hydrating herself well and taking salty foods if needed to keep the blood pressures up on the days when her blood pressures are trending down.  Will obtain a transthoracic echocardiogram to assess cardiac structure and function.  In the setting of her left thyroid mass, there is possibility of mass effect from this involving carotid system causing blood pressure changes. Continue management for the  thyroid mass as per PCP.  Advised to take precautions of new onset of symptoms while he is sitting down or laying down and having her feet brought elevated to avoid persistent hypotension. Advised to keep herself well-hydrated.       Relevant Medications   furosemide (LASIX) 20 MG tablet   Return to clinic tentatively in 6 months.   History of Present Illness:    Kelly Hansen is a 57 y.o. female who is being seen today for the evaluation of recurrent episodes of low blood pressure.  At the request of Kelly Lei, MD.  Was seen in office April 11, 2021 with Kelly Hansen. Pleasant woman here for the visit accompanied by her daughter.  She has a history of mild nonobstructive coronary artery disease on cardiac cath June 2022, morbid obesity, dyslipidemia, GERD, hypothyroidism, recently diagnosed with left thyroid mass. No prior cardiogram available for review  For the last couple months she has been having episodes with low blood pressure associated with lightheadedness and dizziness.  Denies any syncope or falls.  She has had an episode of COVID-19 infection around October.  After that she has been noticing these episodes more frequently. She has discontinued taking any Lasix.  Previously she was taking Lasix 40 mg in the morning and 20 in the afternoons to help with fluid retention. About 3 weeks ago she was at [facility] with persistent low blood pressures lasting for 2 to 3 hours.  She was monitored in the ER.  Workup in the ER with CT chest incidentally identified thyroid mass and  she is currently undergoing further evaluation and had an ultrasound of the neck done yesterday and awaiting other results.  She denies any chest pain, palpitations, syncopal episodes.  Denies any significant change in her effort tolerance.  Denies any orthopnea or pedal edema.  Does not smoke, drink alcohol.  No recreational drug use.  He has not been taking aspirin due to easy bruisability  of the skin. She continues on Crestor 10 mg once daily and tolerating well. She is not on any other antihypertensive medications. Lasix was discontinued about a month ago due to low blood pressures.  EKG in the total show sinus rhythm heart rate 63/min, PR interval 128 ms, QTc normal 421 ms, QRS duration 88 ms.  Recent CT chest 08/04/2023 with left thyroid mass with mass effect on corresponding airway further evaluation recommended.  No evidence of PE.  Aorta was noted to be normal caliber.  No aortic aneurysm.  No significant atherosclerosis of the aorta.  Heart was noted to be normal in size.  No pericardial effusion.  Lipid panel from 12/18/2022 total cholesterol 138, HDL 42, LDL 68, triglycerides 166. BMP from December 18, 2022 BUN 14, creatinine 1.08, EGFR 60 Sodium 141, potassium 4.2 Normal transaminases and alkaline phosphatase TSH normal 3.17 Hemoglobin 14, hematocrit 43.1, WBC 7.2, platelets 286.  Past Medical History:  Diagnosis Date   Acid reflux    Angina pectoris (HCC) 02/12/2021   Arthritis of multiple sites    Arthropathy of lumbar facet joint 06/17/2022   Asthma    Atherosclerosis of aorta (HCC)    Chest pain of uncertain etiology 04/11/2021   CIN III with severe dysplasia 10/06/2016   Formatting of this note might be different from the original. Reports [facility] between 1991-2004 for abnormal Pap followed by Pap q57mos then q4mos She then had TVH in 2004 with Kelly Hansen Needs Pap smears for 20-25y post-hysterectomy due to presumed h/o CIN 2-3   Degeneration of lumbar intervertebral disc 03/30/2020   Depression    Fibromyalgia    GERD (gastroesophageal reflux disease)    Hyperlipidemia    Hypothyroid    Increased body mass index 03/30/2020   Insomnia    Lumbar radiculopathy 06/24/2022   Lumbar spondylosis 06/17/2022   Mild CAD 04/11/2021   Morbid obesity (HCC) 02/12/2021   Peripheral neuropathy    Thyroid disease    Umbilical hernia    Vitamin D deficiency     Past  Surgical History:  Procedure Laterality Date   ABDOMINAL HYSTERECTOMY     APPENDECTOMY     LEFT HEART CATH AND CORONARY ANGIOGRAPHY N/A 02/13/2021   Procedure: LEFT HEART CATH AND CORONARY ANGIOGRAPHY;  Surgeon: Kelly Bollman, MD;  Location: [facility] INVASIVE CV LAB;  Service: Cardiovascular;  Laterality: N/A;    Current Medications: Current Meds  Medication Sig   albuterol (VENTOLIN HFA) 108 (90 Base) MCG/ACT inhaler Inhale 2 puffs into the lungs 3 (three) times daily as needed for wheezing or shortness of breath.   donepezil (ARICEPT) 10 MG tablet Take 10 mg by mouth at bedtime.   DULoxetine (CYMBALTA) 60 MG capsule Take 60 mg by mouth in the morning.   ergocalciferol (VITAMIN D2) 1.25 MG (50000 UT) capsule Take 1 capsule by mouth once a week.   famotidine (PEPCID) 20 MG tablet Take 1 (one) tablet by mouth two times daily.   Fluticasone-Umeclidin-Vilant (TRELEGY ELLIPTA) 100-62.5-25 MCG/ACT AEPB Inhale 1 puff into the lungs daily.   furosemide (LASIX) 20 MG tablet Take 40-60 mg by mouth  daily.   levothyroxine (SYNTHROID) 25 MCG tablet Take 25 mcg by mouth daily before breakfast.   meclizine (ANTIVERT) 12.5 MG tablet Take 12.5 mg by mouth 2 (two) times daily as needed for dizziness.   meloxicam (MOBIC) 15 MG tablet Take 15 mg by mouth daily.   memantine (NAMENDA) 10 MG tablet Take 10 mg by mouth 2 (two) times daily.   montelukast (SINGULAIR) 10 MG tablet Take 1 tablet by mouth at bedtime.   nitroGLYCERIN (NITROSTAT) 0.4 MG SL tablet Take 1 (one) Tablet as needed for chest pain-If not better in 5 minutes repeat x 2--If still not better--seek attention at ER/ Call 911   olopatadine (PATANOL) 0.1 % ophthalmic solution Place 1 drop into each eye as directed   pantoprazole (PROTONIX) 40 MG tablet Take 40 mg by mouth in the morning.   potassium chloride (KLOR-CON) 10 MEQ tablet Take 10 mEq by mouth 2 (two) times daily.   pramipexole (MIRAPEX) 0.125 MG tablet Take 0.125 mg by mouth 2 (two) times  daily.   pregabalin (LYRICA) 100 MG capsule Take 100 mg by mouth 3 (three) times daily.   rosuvastatin (CRESTOR) 10 MG tablet Take 10 mg by mouth at bedtime.   tiZANidine (ZANAFLEX) 4 MG tablet Take 4 mg by mouth 2 (two) times daily.   traZODone (DESYREL) 50 MG tablet Take 50 mg by mouth at bedtime as needed for sleep.   Vibegron (GEMTESA) 75 MG TABS Take 75 mg by mouth daily.     Allergies:   Penicillins and Sulfa antibiotics   Social History   Socioeconomic History   Marital status: Married    Spouse name: Marth Wiltfong   Number of children: Not on file   Years of education: Not on file   Highest education level: Not on file  Occupational History   Not on file  Tobacco Use   Smoking status: Former    Current packs/day: 0.00    Types: Cigarettes    Quit date: 03/10/2011    Years since quitting: 12.4   Smokeless tobacco: Never  Vaping Use   Vaping status: Never Used  Substance and Sexual Activity   Alcohol use: No   Drug use: No   Sexual activity: Yes  Other Topics Concern   Not on file  Social History Narrative   Right handed   Lives with family    One story home few steps to get into home    Social Determinants of Health   Financial Resource Strain: Not on file  Food Insecurity: Not on file  Transportation Needs: Not on file  Physical Activity: Not on file  Stress: Not on file  Social Connections: Not on file     Family History: The patient's family history includes Asthma in her mother and sister; Cirrhosis in her brother; Colon cancer in her father; Dementia in her maternal aunt, maternal uncle, mother, and sister; Diabetes in her brother and mother; Heart disease in her brother and mother; Hyperlipidemia in her brother and mother; Hypertension in her brother and mother; Kidney disease in her brother and mother; Melanoma in her mother; Rectal cancer in her father; Skin cancer in her father; Stroke in her mother. ROS:   Please see the history of present illness.     All 14 point review of systems negative except as described per history of present illness.  EKGs/Labs/Other Studies Reviewed:    The following studies were reviewed today: Cardiac Studies & Procedures   CARDIAC CATHETERIZATION  CARDIAC CATHETERIZATION  02/13/2021  Narrative 1.  Patent coronary arteries with mild nonobstructive plaquing of the mid RCA, otherwise minimal CAD with widely patent vessels throughout 2.  Normal LV size and systolic function with LVEF estimated at 55 to 60%  Suspect noncardiac chest pain.  Risk reduction measures indicated in this patient with mild nonobstructive CAD.  Findings Coronary Findings Diagnostic  Dominance: Right  Left Main Vessel is angiographically normal.  Left Anterior Descending The vessel exhibits minimal luminal irregularities. The LAD reaches the apex.  The diagonal branches are patent.  The LAD is patent with minimal irregularity in the proximal vessel.  Left Circumflex Vessel is angiographically normal. The circumflex is normal with no stenosis.  The vessel gives off a first OM branch with no stenosis.  Beyond the first OM, the AV circumflex supplies 2 moderate caliber posterolateral branches with no stenosis.  Right Coronary Artery There is mild diffuse disease throughout the vessel. The RCA is a dominant vessel.  The vessel has diffuse plaquing with no significant stenosis.  The mid vessel has less than 30% stenosis present.  The vessel divides terminally into the PDA and PLA branches, both are patent.  Intervention  No interventions have been documented.                EKG:  EKG Interpretation Date/Time:  Thursday August 20 2023 13:53:19 EST Ventricular Rate:  63 PR Interval:  128 QRS Duration:  88 QT Interval:  412 QTC Calculation: 421 R Axis:   68  Text Interpretation: Normal sinus rhythm Normal ECG When compared with ECG of 04-Oct-2019 11:47, No significant change was found Confirmed by Huntley Dec reddy  [phone]) on 08/20/2023 1:39:09 PM    Recent Labs: No results found for requested labs within last 365 days.  Recent Lipid Panel No results found for: "CHOL", "TRIG", "HDL", "CHOLHDL", "VLDL", "LDLCALC", "LDLDIRECT"  Physical Exam:    VS:  BP 118/60   Pulse 63   Ht 5' (1.524 m)   Wt 238 lb (108 kg)   SpO2 94%   BMI 46.48 kg/m     Wt Readings from Last 3 Encounters:  08/20/23 238 lb (108 kg)  08/12/23 236 lb (107 kg)  07/14/23 231 lb 9.6 oz (105.1 kg)     GENERAL:  Well nourished, well developed in no acute distress NECK: No JVD; No carotid bruits CARDIAC: RRR, S1 and S2 present, no murmurs, no rubs, no gallops CHEST:  Clear to auscultation without rales, wheezing or rhonchi  Extremities: No pitting pedal edema. Pulses bilaterally symmetric with radial 2+ and dorsalis pedis 2+ NEUROLOGIC:  Alert and oriented x 3  Medication Adjustments/Labs and Tests Ordered: Current medicines are reviewed at length with the patient today.  Concerns regarding medicines are outlined above.  Orders Placed This Encounter  Procedures   EKG 12-Lead   ECHOCARDIOGRAM COMPLETE   No orders of the defined types were placed in this encounter.   Signed, Kandi Brusseau reddy Myriah Boggus, MD, MPH, [facility]. 08/20/2023 2:02 PM    [facility]

## 2023-08-20 NOTE — Patient Instructions (Signed)
Medication Instructions:  Your physician recommends that you continue on your current medications as directed. Please refer to the Current Medication list given to you today.  *If you need a refill on your cardiac medications before your next appointment, please call your pharmacy*   Lab Work: None If you have labs (blood work) drawn today and your tests are completely normal, you will receive your results only by: MyChart Message (if you have MyChart) OR A paper copy in the mail If you have any lab test that is abnormal or we need to change your treatment, we will call you to review the results.   Testing/Procedures: Your physician has requested that you have an echocardiogram. Echocardiography is a painless test that uses sound waves to create images of your heart. It provides your doctor with information about the size and shape of your heart and how well your heart's chambers and valves are working. This procedure takes approximately one hour. There are no restrictions for this procedure. Please do NOT wear cologne, perfume, aftershave, or lotions (deodorant is allowed). Please arrive 15 minutes prior to your appointment time.  Please note: We ask at that you not bring children with you during ultrasound (echo/ vascular) testing. Due to room size and safety concerns, children are not allowed in the ultrasound rooms during exams. Our front office staff cannot provide observation of children in our lobby area while testing is being conducted. An adult accompanying a patient to their appointment will only be allowed in the ultrasound room at the discretion of the ultrasound technician under special circumstances. We apologize for any inconvenience.    Follow-Up: At [facility], you and your health needs are our priority.  As part of our continuing mission to provide you with exceptional heart care, we have created designated Provider Care Teams.  These Care Teams include your  primary Cardiologist (physician) and Advanced Practice Providers (APPs -  Physician Assistants and Nurse Practitioners) who all work together to provide you with the care you need, when you need it.  We recommend signing up for the patient portal called "MyChart".  Sign up information is provided on this After Visit Summary.  MyChart is used to connect with patients for Virtual Visits (Telemedicine).  Patients are able to view lab/test results, encounter notes, upcoming appointments, etc.  Non-urgent messages can be sent to your provider as well.   To learn more about what you can do with MyChart, go to [email or web address].    Your next appointment:   6 month(s)  Provider:   Huntley Dec, MD    Other Instructions None

## 2023-08-20 NOTE — Assessment & Plan Note (Signed)
Last lipid panel from April 2024 appears optimal. Continue Crestor 10 mg once daily in the setting of mild nonobstructive coronary artery disease.

## 2023-08-25 ENCOUNTER — Ambulatory Visit: Payer: Medicare HMO | Admitting: Obstetrics

## 2023-08-26 ENCOUNTER — Ambulatory Visit: Payer: Medicare HMO | Admitting: Obstetrics

## 2023-08-26 HISTORY — PX: BIOPSY THYROID: PRO38

## 2023-08-27 DIAGNOSIS — E079 Disorder of thyroid, unspecified: Secondary | ICD-10-CM | POA: Diagnosis not present

## 2023-08-28 DIAGNOSIS — E042 Nontoxic multinodular goiter: Secondary | ICD-10-CM | POA: Diagnosis not present

## 2023-08-31 DIAGNOSIS — E785 Hyperlipidemia, unspecified: Secondary | ICD-10-CM | POA: Diagnosis not present

## 2023-08-31 DIAGNOSIS — N3281 Overactive bladder: Secondary | ICD-10-CM | POA: Diagnosis not present

## 2023-08-31 DIAGNOSIS — R1310 Dysphagia, unspecified: Secondary | ICD-10-CM | POA: Diagnosis not present

## 2023-08-31 DIAGNOSIS — M7989 Other specified soft tissue disorders: Secondary | ICD-10-CM | POA: Diagnosis not present

## 2023-08-31 DIAGNOSIS — K219 Gastro-esophageal reflux disease without esophagitis: Secondary | ICD-10-CM | POA: Diagnosis not present

## 2023-08-31 DIAGNOSIS — E559 Vitamin D deficiency, unspecified: Secondary | ICD-10-CM | POA: Diagnosis not present

## 2023-08-31 DIAGNOSIS — Z6841 Body Mass Index (BMI) 40.0 and over, adult: Secondary | ICD-10-CM | POA: Diagnosis not present

## 2023-08-31 DIAGNOSIS — M797 Fibromyalgia: Secondary | ICD-10-CM | POA: Diagnosis not present

## 2023-09-04 ENCOUNTER — Ambulatory Visit: Payer: Medicare HMO | Admitting: Obstetrics

## 2023-09-04 ENCOUNTER — Encounter: Payer: Self-pay | Admitting: Obstetrics

## 2023-09-04 VITALS — BP 117/78 | HR 74

## 2023-09-04 DIAGNOSIS — N393 Stress incontinence (female) (male): Secondary | ICD-10-CM

## 2023-09-04 DIAGNOSIS — R152 Fecal urgency: Secondary | ICD-10-CM

## 2023-09-04 DIAGNOSIS — N3281 Overactive bladder: Secondary | ICD-10-CM

## 2023-09-04 DIAGNOSIS — R351 Nocturia: Secondary | ICD-10-CM

## 2023-09-04 DIAGNOSIS — M6289 Other specified disorders of muscle: Secondary | ICD-10-CM

## 2023-09-04 DIAGNOSIS — R159 Full incontinence of feces: Secondary | ICD-10-CM | POA: Diagnosis not present

## 2023-09-04 HISTORY — DX: Full incontinence of feces: R15.2

## 2023-09-04 HISTORY — DX: Other specified disorders of muscle: M62.89

## 2023-09-04 HISTORY — DX: Overactive bladder: N32.81

## 2023-09-04 HISTORY — DX: Nocturia: R35.1

## 2023-09-04 HISTORY — DX: Stress incontinence (female) (male): N39.3

## 2023-09-04 MED ORDER — MIRABEGRON ER 50 MG PO TB24: 50.0000 mg | ORAL_TABLET | Freq: Every day | ORAL | 1 refills | Status: DC

## 2023-09-04 NOTE — Assessment & Plan Note (Signed)
-   avoid fluid intake after 6pm - elevated feet during the day or use compression socks to reduce lower extremity swelling - switch diuretic (e.g. furosemide) dosing to 2pm if resumed - history of snoring and OSA, referred to sleep center for CPAP fitting

## 2023-09-04 NOTE — Assessment & Plan Note (Signed)
-   POCT UA + protein with Cr 1.11 on 02/12/21. Will repeat Cr prior to 3rd line therapy - encouraged caffeine reduction, timed voids, and bladder training - We discussed the symptoms of overactive bladder (OAB), which include urinary urgency, urinary frequency, nocturia, with or without urge incontinence.  While we do not know the exact etiology of OAB, several treatment options exist. We discussed management including behavioral therapy (decreasing bladder irritants, urge suppression strategies, timed voids, bladder retraining), physical therapy, medication; for refractory cases posterior tibial nerve stimulation, sacral neuromodulation, and intravesical botulinum toxin injection.  For anticholinergic medications, we discussed the potential side effects of anticholinergics including dry eyes, dry mouth, constipation, cognitive impairment and urinary retention. For Beta-3 agonist medication, we discussed the potential side effect of elevated blood pressure which is more likely to occur in individuals with uncontrolled hypertension. - minimal relief with Gemtesa, continue for 1 additional week and switch to mirabegron if no relief. Advised pt to monitor blood pressure for 1 week after starting mirabegron and discontinue if BP is elevated.  - pending urodynamics due to mixed symptoms prior to 3rd line treatment

## 2023-09-04 NOTE — Assessment & Plan Note (Signed)
-   prior left sided pelvic floor myofascial pain on exam - pelvic floor PT difficult due to limited access - discussed possible need for additional treatment after treatment of mixed UI

## 2023-09-04 NOTE — Progress Notes (Signed)
[facility] Return Visit  SUBJECTIVE  History of Present Illness: Kelly Hansen is a 57 y.o. female seen in follow-up for overactive bladder and fecal incontinence. Plan at last visit was start fiber supplementation.   Reports some reduction of night time frequency with Gemtesa Leaks urine with cough/ sneeze, exercise, lifting, going from sitting to standing, during sex, with a full bladder, and without sensation Day time voids 10.  Nocturia: 2-3 times per night to void. Unchanged objectively with Gemtesa Positive CST and left sided pelvic floor myofascial pain on exam 07/14/23 Using Gemtesa, previously on oxybutynin with brain fog. Stopped taking lasix due to low BP Leg swelling without compression socks Leaks 2-3x/day with activity with large volume leakage Stopped drinking fluids at 7pm, sleeps around 8:30pm or 9pm Denies using CPAP for OSA, tried mouth piece  Leaks 1-2x/day with urgency after timed voids. Mostly with full bladder and bending. More bothersome Pad use: 2 liners/ mini-pads per day. Drinks: 2-3 down from 4-5 cups daily Coffee, 3-6x 8oz cups Water, 1 cup Gingerale per day  BM 1-2x/day with straining, splinting, and leakage 2-3x/year with soft of liquid stool. Patient reports history of ulcerative colitis pending referral to re-establish  Significant low back pain  History of CIN III Desires to proceed with midurethral sling  Past Medical History: Patient  has a past medical history of Acid reflux, Angina pectoris (HCC) (02/12/2021), Arthritis of multiple sites, Arthropathy of lumbar facet joint (06/17/2022), Asthma, Atherosclerosis of aorta (HCC), Chest pain of uncertain etiology (04/11/2021), CIN III with severe dysplasia (10/06/2016), Degeneration of lumbar intervertebral disc (03/30/2020), Depression, Fibromyalgia, GERD (gastroesophageal reflux disease), Hyperlipidemia, Hypothyroid, Increased body mass index (03/30/2020), Insomnia, Lumbar radiculopathy  (06/24/2022), Lumbar spondylosis (06/17/2022), Mild CAD (04/11/2021), Morbid obesity (HCC) (02/12/2021), Peripheral neuropathy, Thyroid disease, Umbilical hernia, and Vitamin D deficiency.   Past Surgical History: She  has a past surgical history that includes Abdominal hysterectomy; Appendectomy; LEFT HEART CATH AND CORONARY ANGIOGRAPHY (N/A, 02/13/2021); and Biopsy thyroid (08/26/2023).   Medications: She has a current medication list which includes the following prescription(s): albuterol, donepezil, duloxetine, ergocalciferol, famotidine, trelegy ellipta, furosemide, levothyroxine, meclizine, meloxicam, memantine, mirabegron er, montelukast, nitroglycerin, olopatadine, pantoprazole, potassium chloride, pramipexole, pregabalin, rosuvastatin, tizanidine, and trazodone.   Allergies: Patient is allergic to penicillins and sulfa antibiotics.   Social History: Patient  reports that she quit smoking about 12 years ago. Her smoking use included cigarettes. She has never used smokeless tobacco. She reports that she does not drink alcohol and does not use drugs.     OBJECTIVE     Physical Exam: Vitals:   09/04/23 0958  BP: 117/78  Pulse: 74   Gen: No apparent distress, A&O x 3.  Detailed Urogynecologic Evaluation:  Deferred. Prior exam showed:  Lab Results  Component Value Date   CREATININE 1.11 (H) 02/12/2021   CREATININE 0.99 10/04/2019   CREATININE 1.01 (H) 12/02/2015        No data to display             ASSESSMENT AND PLAN    Kelly Hansen is a 57 y.o. with:  1. SUI (stress urinary incontinence, female)   2. Pelvic floor dysfunction in female   3. OAB (overactive bladder)   4. Nocturia   5. Incontinence of feces with fecal urgency     SUI (stress urinary incontinence, female) Assessment & Plan: - leaks with bending over 2-3x/day - For treatment of stress urinary incontinence,  non-surgical options include expectant management, weight loss, physical therapy,  as  well as a pessary.  Surgical options include a midurethral sling, Burch urethropexy, and transurethral injection of a bulking agent. - reviewed midurethral sling, discussed urodynamics due to urgency dominant mixed UI - UDS scheduled - continue timed voids    Pelvic floor dysfunction in female Assessment & Plan: - prior left sided pelvic floor myofascial pain on exam - pelvic floor PT difficult due to limited access - discussed possible need for additional treatment after treatment of mixed UI   OAB (overactive bladder) Assessment & Plan: - POCT UA + protein with Cr 1.11 on 02/12/21. Will repeat Cr prior to 3rd line therapy - encouraged caffeine reduction, timed voids, and bladder training - We discussed the symptoms of overactive bladder (OAB), which include urinary urgency, urinary frequency, nocturia, with or without urge incontinence.  While we do not know the exact etiology of OAB, several treatment options exist. We discussed management including behavioral therapy (decreasing bladder irritants, urge suppression strategies, timed voids, bladder retraining), physical therapy, medication; for refractory cases posterior tibial nerve stimulation, sacral neuromodulation, and intravesical botulinum toxin injection.  For anticholinergic medications, we discussed the potential side effects of anticholinergics including dry eyes, dry mouth, constipation, cognitive impairment and urinary retention. For Beta-3 agonist medication, we discussed the potential side effect of elevated blood pressure which is more likely to occur in individuals with uncontrolled hypertension. - minimal relief with Gemtesa, continue for 1 additional week and switch to mirabegron if no relief. Advised pt to monitor blood pressure for 1 week after starting mirabegron and discontinue if BP is elevated.  - pending urodynamics due to mixed symptoms prior to 3rd line treatment  Orders: -     Mirabegron ER; Take 1 tablet (50 mg  total) by mouth daily.  Dispense: 30 tablet; Refill: 1  Nocturia Assessment & Plan: - avoid fluid intake after 6pm - elevated feet during the day or use compression socks to reduce lower extremity swelling - switch diuretic (e.g. furosemide) dosing to 2pm if resumed - history of snoring and OSA, referred to sleep center for CPAP fitting   Orders: -     Ambulatory referral to Sleep Studies -     Mirabegron ER; Take 1 tablet (50 mg total) by mouth daily.  Dispense: 30 tablet; Refill: 1  Incontinence of feces with fecal urgency Assessment & Plan: - pt reports history of ulcerative colitis pending to re-establish care with GI  - encouraged pt to continue close follow-up with GI - Treatment options include anti-diarrhea medication (loperamide/ Imodium OTC or prescription lomotil), fiber supplements, physical therapy, and possible sacral neuromodulation or surgery.      Time spent: I spent 35 minutes dedicated to the care of this patient on the date of this encounter to include pre-visit review of records, face-to-face time with the patient discussing mixed urinary incontinence, nocturia, pelvic floor dysfunction, fecal incontinence, and post visit documentation and ordering medication/referral.    Loleta Chance, MD

## 2023-09-04 NOTE — Assessment & Plan Note (Signed)
-   leaks with bending over 2-3x/day - For treatment of stress urinary incontinence,  non-surgical options include expectant management, weight loss, physical therapy, as well as a pessary.  Surgical options include a midurethral sling, Burch urethropexy, and transurethral injection of a bulking agent. - reviewed midurethral sling, discussed urodynamics due to urgency dominant mixed UI - UDS scheduled - continue timed voids

## 2023-09-04 NOTE — Assessment & Plan Note (Signed)
-   pt reports history of ulcerative colitis pending to re-establish care with GI  - encouraged pt to continue close follow-up with GI - Treatment options include anti-diarrhea medication (loperamide/ Imodium OTC or prescription lomotil), fiber supplements, physical therapy, and possible sacral neuromodulation or surgery.

## 2023-09-04 NOTE — Patient Instructions (Addendum)
For night time frequency: - avoid fluid intake after 6pm - elevated your feet during the day or use compression socks to reduce lower extremity swelling - switch your diuretic (e.g. furosemide) dosing to 2pm if you resume use - I sent a referral to sleep center for CPAP fitting due to your history of sleep apnea  We discussed the symptoms of overactive bladder (OAB), which include urinary urgency, urinary frequency, nocturia, with or without urge incontinence.  While we do not know the exact etiology of OAB, several treatment options exist. We discussed management including behavioral therapy (decreasing bladder irritants, urge suppression strategies, timed voids, bladder retraining), physical therapy, medication; for refractory cases posterior tibial nerve stimulation, sacral neuromodulation, and intravesical botulinum toxin injection.  For Beta-3 agonist medication, we discussed the potential side effect of elevated blood pressure which is more likely to occur in individuals with uncontrolled hypertension.  Switch to mirabegron 50mg  and discontinue Gemtesa. Please check your blood pressure for 1 week and call if you experience elevation > 140/100s.  Follow-up with gastroenterology for your history of ulcerative colitis.

## 2023-09-17 ENCOUNTER — Ambulatory Visit: Payer: PPO

## 2023-09-17 DIAGNOSIS — I2510 Atherosclerotic heart disease of native coronary artery without angina pectoris: Secondary | ICD-10-CM

## 2023-09-17 LAB — ECHOCARDIOGRAM COMPLETE
Area-P 1/2: 3.29 cm2
MV M vel: 4.08 m/s
MV Peak grad: 66.6 mm[Hg]
S' Lateral: 3.4 cm

## 2023-09-23 ENCOUNTER — Telehealth: Payer: Self-pay

## 2023-09-23 NOTE — Telephone Encounter (Signed)
 Pt requesting cb regarding Echo results

## 2023-09-24 NOTE — Telephone Encounter (Signed)
 Left message for the patient to call back.

## 2023-09-24 NOTE — Telephone Encounter (Signed)
 Called patient and informed her of the results of her echo below:  Sent a message to her on MyChart. Please inform her the echo results show normal pumping function of the heart, no significant valve abnormalities.  Overall stable findings and good news. Thank you  Patient verbalized understanding and had no further questions at this time.

## 2023-09-30 DIAGNOSIS — G4730 Sleep apnea, unspecified: Secondary | ICD-10-CM | POA: Diagnosis not present

## 2023-09-30 DIAGNOSIS — Z6841 Body Mass Index (BMI) 40.0 and over, adult: Secondary | ICD-10-CM | POA: Diagnosis not present

## 2023-09-30 DIAGNOSIS — J309 Allergic rhinitis, unspecified: Secondary | ICD-10-CM | POA: Diagnosis not present

## 2023-09-30 DIAGNOSIS — G4700 Insomnia, unspecified: Secondary | ICD-10-CM | POA: Diagnosis not present

## 2023-09-30 DIAGNOSIS — M545 Low back pain, unspecified: Secondary | ICD-10-CM | POA: Diagnosis not present

## 2023-10-06 ENCOUNTER — Ambulatory Visit (INDEPENDENT_AMBULATORY_CARE_PROVIDER_SITE_OTHER): Payer: PPO | Admitting: Obstetrics and Gynecology

## 2023-10-06 VITALS — BP 102/68 | HR 87

## 2023-10-06 DIAGNOSIS — N393 Stress incontinence (female) (male): Secondary | ICD-10-CM

## 2023-10-06 DIAGNOSIS — R350 Frequency of micturition: Secondary | ICD-10-CM

## 2023-10-06 LAB — POCT URINALYSIS DIPSTICK
Bilirubin, UA: NEGATIVE
Blood, UA: NEGATIVE
Glucose, UA: NEGATIVE
Ketones, UA: NEGATIVE
Leukocytes, UA: NEGATIVE
Nitrite, UA: NEGATIVE
Protein, UA: NEGATIVE
Spec Grav, UA: 1.01 (ref 1.010–1.025)
Urobilinogen, UA: 0.2 U/dL
pH, UA: 6.5 (ref 5.0–8.0)

## 2023-10-06 NOTE — Progress Notes (Signed)
[facility] Urodynamics Procedure  Referring Physician: Lucianne Lei, MD Date of Procedure: 10/06/2023  Kelly Hansen is a 58 y.o. female who presents for urodynamic evaluation. Indication(s) for study: SUI and OAB  Vital Signs: BP 102/68   Pulse 87   Laboratory Results: A catheterized urine specimen revealed:  Lab Results  Component Value Date   COLORU yellow 10/06/2023   CLARITYU clear 10/06/2023   GLUCOSEUR Negative 10/06/2023   BILIRUBINUR negative 10/06/2023   KETONESU negative 10/06/2023   SPECGRAV 1.010 10/06/2023   RBCUR negative 10/06/2023   PHUR 6.5 10/06/2023   PROTEINUR Negative 10/06/2023   UROBILINOGEN 0.2 10/06/2023   LEUKOCYTESUR Negative 10/06/2023      Voiding Diary: Deferred   Procedure Timeout:  The correct patient was verified and the correct procedure was verified. The patient was in the correct position and safety precautions were reviewed based on at the patient's history.  Urodynamic Procedure A 48F dual lumen urodynamics catheter was placed under sterile conditions into the patient's bladder. A 48F catheter was placed into the rectum in order to measure abdominal pressure. EMG patches were placed in the appropriate position.  All connections were confirmed and calibrations/adjusted made. Saline was instilled into the bladder through the dual lumen catheters.  Cough/valsalva pressures were measured periodically during filling.  Patient was allowed to void.  The bladder was then emptied of its residual.  UROFLOW: Revealed a Qmax of 36 mL/sec.  She voided 612 mL and had a residual of 60 mL.  It was a normal pattern and represented normal habits.   CMG: This was performed with sterile water in the sitting position at a fill rate of 30 mL/min.    First sensation of fullness was 206 mLs,  First urge was 317 mLs,  Strong urge was 569 mLs and  Capacity was 844 mLs  Stress incontinence was demonstrated Highest positive CLPP was 135  cmH20 at 570 ml. Highest positive VLPP was 55 cmH20at 570 ml.   Detrusor function was normal, with phasic contractions seen.  The first occurred at 287 mL to 2.3 cm of water and was not associated with urge.  Compliance:  WNL. End fill detrusor pressure was 1.5cmH20.  Calculated compliance was 583mL/cmH20  UPP: MUCP without barrier reduction was 50 cm of water.    MICTURITION STUDY: Patient attempted to void in the urodynamics chair and was unable to, when she stood to move to the bedside commode she had a DO that caused a significant loss of urine prior to starting the micturition study. Voiding was performed without reduction in the sitting position.  Pdet at Qmax was 18 cm of water.  Qmax was 24 mL/sec.  It was a normal pattern.  She voided 814 mL and had a residual of 40 mL.  It was a volitional void, sustained detrusor contraction was present and abdominal straining was not present  EMG: This was performed with patches.  She had voluntary contractions, recruitment with fill was present and urethral sphincter was relaxed with void.  The details of the procedure with the study tracings have been scanned into EPIC.   Urodynamic Impression:  1. Sensation was reduced; capacity was increased 2. Stress Incontinence was demonstrated at normal pressures; 3. Detrusor Overactivity was not demonstrated, but when patient stood to move to bedside commode, she had a significant DO that caused leakage with change in position prior to her micturition. Urine loss of approximately 30ml.  4. Emptying was normal with a normal PVR, a  sustained detrusor contraction present,  abdominal straining not present, normal urethral sphincter activity on EMG.  Plan: - The patient will follow up  to discuss the findings and treatment options.  -We discussed that the loss of urine noted when standing was much more significant that her SUI leakage that was noted.

## 2023-10-07 DIAGNOSIS — R635 Abnormal weight gain: Secondary | ICD-10-CM | POA: Diagnosis not present

## 2023-10-14 ENCOUNTER — Encounter: Payer: Self-pay | Admitting: Obstetrics

## 2023-10-14 ENCOUNTER — Ambulatory Visit (INDEPENDENT_AMBULATORY_CARE_PROVIDER_SITE_OTHER): Payer: PPO | Admitting: Obstetrics

## 2023-10-14 VITALS — BP 95/48 | HR 78

## 2023-10-14 DIAGNOSIS — R152 Fecal urgency: Secondary | ICD-10-CM | POA: Diagnosis not present

## 2023-10-14 DIAGNOSIS — N393 Stress incontinence (female) (male): Secondary | ICD-10-CM | POA: Diagnosis not present

## 2023-10-14 DIAGNOSIS — R159 Full incontinence of feces: Secondary | ICD-10-CM

## 2023-10-14 DIAGNOSIS — R351 Nocturia: Secondary | ICD-10-CM

## 2023-10-14 DIAGNOSIS — M6289 Other specified disorders of muscle: Secondary | ICD-10-CM | POA: Diagnosis not present

## 2023-10-14 DIAGNOSIS — N3281 Overactive bladder: Secondary | ICD-10-CM

## 2023-10-14 NOTE — Assessment & Plan Note (Addendum)
-   pt reports history of ulcerative colitis pending to re-establish care with GI, pt to follow-up with PCP for referral - encouraged pt to continue close follow-up with GI - Treatment options include anti-diarrhea medication (loperamide/ Imodium OTC or prescription lomotil), fiber supplements, physical therapy, and possible sacral neuromodulation or surgery.   - trial of fiber supplementation to optimize stool consistency - discussed possible SNM for refractory OAB and fecal incontinence symptoms - discussed pelvic floor PT due to significant pain with exam and known myofascial pelvic pain on exam

## 2023-10-14 NOTE — Progress Notes (Addendum)
[facility] Return Visit  SUBJECTIVE  History of Present Illness: Kelly Hansen is a 58 y.o. female seen in follow-up for stress urinary incontinence, OAB, nocturia, fecal incontinence, and pelvic floor dysfunction. Plan at last visit was urodynamics and switch to OAB medications.   Minimal relief with gemtesa Pending switch to mirabegron with Rx ready, pending start next week. Trying timed voids Limited access to pelvic floor PT Compression socks Discontinued diuretic due to low BP until this week. BP 90s/40s.  Reports mild reductio of nocturia from 2-3x/night to 2x/night Pending appt for CPAP next week Has not established GI referral for h/o UC BMI 46, seeing weight doctor and lost 8lbs   Urodynamic Impression 10/06/23:  Revealed a Qmax of 36 mL/sec.  She voided 612 mL and had a residual of 60 mL.   1. Sensation was reduced; capacity was increased 2. Stress Incontinence was demonstrated at normal pressures; MUCP 50 3. Detrusor Overactivity was not demonstrated, but when patient stood to move to bedside commode, she had a significant DO that caused leakage with change in position prior to her micturition. Urine loss of approximately 30ml.  4. Emptying was normal with a normal PVR, a sustained detrusor contraction present,  abdominal straining not present, normal urethral sphincter activity on EMG.  Past Medical History: Patient  has a past medical history of Acid reflux, Angina pectoris (HCC) (02/12/2021), Arthritis of multiple sites, Arthropathy of lumbar facet joint (06/17/2022), Asthma, Atherosclerosis of aorta (HCC), Chest pain of uncertain etiology (04/11/2021), CIN III with severe dysplasia (10/06/2016), Degeneration of lumbar intervertebral disc (03/30/2020), Depression, Fibromyalgia, GERD (gastroesophageal reflux disease), Hyperlipidemia, Hypothyroid, Increased body mass index (03/30/2020), Insomnia, Lumbar radiculopathy (06/24/2022), Lumbar spondylosis  (06/17/2022), Mild CAD (04/11/2021), Morbid obesity (HCC) (02/12/2021), Peripheral neuropathy, Thyroid disease, Umbilical hernia, and Vitamin D deficiency.   Past Surgical History: She  has a past surgical history that includes Abdominal hysterectomy; Appendectomy; LEFT HEART CATH AND CORONARY ANGIOGRAPHY (N/A, 02/13/2021); and Biopsy thyroid (08/26/2023).   Medications: She has a current medication list which includes the following prescription(s): albuterol, donepezil, duloxetine, ergocalciferol, famotidine, trelegy ellipta, furosemide, levothyroxine, meclizine, meloxicam, memantine, mirabegron er, montelukast, nitroglycerin, olopatadine, pantoprazole, potassium chloride, pramipexole, pregabalin, rosuvastatin, tizanidine, and trazodone.   Allergies: Patient is allergic to penicillins and sulfa antibiotics.   Social History: Patient  reports that she quit smoking about 12 years ago. Her smoking use included cigarettes. She has never used smokeless tobacco. She reports that she does not drink alcohol and does not use drugs.     OBJECTIVE     Physical Exam: Vitals:   10/14/23 1244  BP: (!) 95/48  Pulse: 78   Gen: No apparent distress, A&O x 3.  Detailed Urogynecologic Evaluation:  Deferred. Prior exam showed:  POP-Q  -2                                            Aa   -2                                           Ba  -8  C   2                                            Gh  2                                            Pb  9                                            tvl   -3                                            Ap  -3                                            Bp                                                 D   CIC teaching: Reviewed anatomy, handwashing, supplies needed for CIC. After verbal consent was obtained from the patient for catheterization prior to bladder botox injection. Patient performed an in and out  catheterization with minimal assistance.  The patient tolerated the procedure well.  Pain with palpation of levator ani bilaterally   Rectal Exam:  Normal sphincter tone, no distal rectocele, enterocoele not present, no rectal masses, noted dyssynergia when asking the patient to bear down with significant pain during exam.      No data to display             ASSESSMENT AND PLAN    [name] is a 58 y.o. with:  1. OAB (overactive bladder)   2. SUI (stress urinary incontinence, female)   3. Pelvic floor dysfunction in female   4. Nocturia   5. Incontinence of feces with fecal urgency   6. Morbid obesity (HCC)     OAB (overactive bladder) Assessment & Plan: - POCT UA + protein with Cr 1.11 on 02/12/21. Will repeat Cr prior to 3rd line therapy - encouraged caffeine reduction, timed voids, and bladder training - We discussed the symptoms of overactive bladder (OAB), which include urinary urgency, urinary frequency, nocturia, with or without urge incontinence.  While we do not know the exact etiology of OAB, several treatment options exist. We discussed management including behavioral therapy (decreasing bladder irritants, urge suppression strategies, timed voids, bladder retraining), physical therapy, medication; for refractory cases posterior tibial nerve stimulation, sacral neuromodulation, and intravesical botulinum toxin injection.  For anticholinergic medications, we discussed the potential side effects of anticholinergics including dry eyes, dry mouth, constipation, cognitive impairment and urinary retention. For Beta-3 agonist medication, we discussed the potential side effect of elevated blood pressure which is more likely to occur in individuals with uncontrolled hypertension. - minimal relief with Gemtesa, switch to  mirabegron and monitor clinical change. Advised pt to monitor blood pressure for 1 week after starting mirabegron and discontinue if BP is elevated.  - For  refractory OAB we reviewed the procedure for intravesical Botox injection with cystoscopy in the office and reviewed the risks, benefits and alternatives of treatment including but not limited to infection, need for self-catheterization and need for repeat therapy.  We discussed that there is a 5-15% chance of needing to catheterize with Botox and that this usually resolves in a few months; however can persist for longer periods of time.  Typically Botox injections would need to be repeated every 3-12 months since this is not a permanent therapy.   We discussed the role of sacral neuromodulation and how it works. It requires a test phase, and documentation of bladder function via diary. After a successful test period, a permanent wire and generator are placed in the OR. The battery lasts 5 years on average and would need to be replaced surgically.  The goal of this therapy is at least a 50% improvement in symptoms. It is NOT realistic to expect a 100% cure.  We reviewed the fact that about 30% of patients fail the test phase and are not candidates for permanent generator placement.  We discussed the risk of infection and that the patient would not be able to get an MRI once the device is placed. There are two companies that provide this therapy: Medtronic and [employer]. [employer]' product is new and is similar to Medtronic's, but has advantages of a smaller and rechargeable battery and being able to have an MRI with the implant. For all procedures, we discussed risks of bleeding, infection, damage to surrounding organs including bowel, bladder, blood vessels, ureters and nerves, need for further surgery, risk of postoperative urinary incontinence or retention with need to catheterize, recurrent prolapse, numbness and weakness at any body site, buttock pain, and the rarer risks of blood clot, heart attack, pneumonia, death.    We also discussed the role of percutaneous tibial nerve stimulation and how it works.  She  understands it requires 12 weekly visits for temporary neuromodulation of the sacral nerve roots via the tibial nerve and that she may then require continued tapered treatment.  She will return for the procedure. All questions were answered.  - UDS 10/06/23 DOI with movement, normal PVR and sustained detrusor contraction during pressure flow.  - pt desires to proceed with botox injection, underwent CIC teaching without difficulty today.   Orders: -     Creatinine, serum; Future  SUI (stress urinary incontinence, female) Assessment & Plan: - leaks with bending over 2-3x/day - For treatment of stress urinary incontinence,  non-surgical options include expectant management, weight loss, physical therapy, as well as a pessary.  Surgical options include a midurethral sling, Burch urethropexy, and transurethral injection of a bulking agent. - reviewed midurethral sling, discussed urodynamics due to urgency dominant mixed UI - UDS 10/06/23 with + SUI and minimal urethral hypermobility. MUCP 50.   - continue to practice timed voids - reviewed office procedure with urethral bulking (Bulkamid). We discussed success rate of approximately 70-80% and possible need for second injection. We reviewed that this is not a permanent procedure and the Bulkamid does dissolve over time. Risks reviewed including injury to bladder or urethra, UTI, urinary retention and hematuria.  - Sling: The effectiveness of a midurethral vaginal mesh sling is approximately 85%, and thus, there will be times when you may leak urine after surgery, especially if  your bladder is full or if you have a strong cough. There is a balance between making the sling tight enough to treat your leakage but not too tight so that you have long-term difficulty emptying your bladder. A mesh sling will not directly treat overactive bladder/urge incontinence and may worsen it.  There is an FDA safety notification on vaginal mesh procedures for prolapse but NOT  mesh slings. We have extensive experience and training with mesh placement and we have close postoperative follow up to identify any potential complications from mesh. It is important to realize that this mesh is a permanent implant that cannot be easily removed. There are rare risks of mesh exposure (2-4%), pain with intercourse (0-7%), and infection (<1%). The risk of mesh exposure if more likely in a woman with risks for poor healing (prior radiation, poorly controlled diabetes, or immunocompromised). The risk of new or worsened chronic pain after mesh implant is more common in women with baseline chronic pain and/or poorly controlled anxiety or depression. Approximately 2-4% of patients will experience longer-term post-operative voiding dysfunction that may require surgical revision of the sling. We also reviewed that postoperatively, her stream may not be as strong as before surgery.  - continue efforts of weight reduction - patient desires to proceed with periurethral bulking  Orders: -     Creatinine, serum; Future  Pelvic floor dysfunction in female Assessment & Plan: - prior left sided pelvic floor myofascial pain on repeated exams - pelvic floor PT difficult due to limited access - discussed possible need for additional treatment after treatment of mixed UI due to persistent pain after anti-incontinence procedures   Nocturia Assessment & Plan: - avoid fluid intake after 6pm - elevated feet during the day or use compression socks to reduce lower extremity swelling - reduction of night time frequency when she stopped diuretic  - history of snoring and OSA, pending to sleep center appt for CPAP fitting   Orders: -     Creatinine, serum; Future  Incontinence of feces with fecal urgency Assessment & Plan: - pt reports history of ulcerative colitis pending to re-establish care with GI, pt to follow-up with PCP for referral - encouraged pt to continue close follow-up with GI -  Treatment options include anti-diarrhea medication (loperamide/ Imodium OTC or prescription lomotil), fiber supplements, physical therapy, and possible sacral neuromodulation or surgery.   - trial of fiber supplementation to optimize stool consistency - discussed possible SNM for refractory OAB and fecal incontinence symptoms - discussed pelvic floor PT due to significant pain with exam and known myofascial pelvic pain on exam   Morbid obesity (HCC) Assessment & Plan: - lost 8lbs with diet modification - desires to return to weight of 130s - encouraged to continue diet modification and active lifestyle   Time spent: I spent 48 minutes dedicated to the care of this patient on the date of this encounter to include pre-visit review of records, face-to-face time with the patient discussing mixed urinary incontinence, fecal incontinence, nocturia, pelvic floor dysfunction, BMI 46 and post visit documentation and ordering medication/ testing.   Loleta Chance, MD

## 2023-10-14 NOTE — Assessment & Plan Note (Signed)
-   leaks with bending over 2-3x/day - For treatment of stress urinary incontinence,  non-surgical options include expectant management, weight loss, physical therapy, as well as a pessary.  Surgical options include a midurethral sling, Burch urethropexy, and transurethral injection of a bulking agent. - reviewed midurethral sling, discussed urodynamics due to urgency dominant mixed UI - UDS 10/06/23 with + SUI and minimal urethral hypermobility. MUCP 50.   - continue to practice timed voids - reviewed office procedure with urethral bulking (Bulkamid). We discussed success rate of approximately 70-80% and possible need for second injection. We reviewed that this is not a permanent procedure and the Bulkamid does dissolve over time. Risks reviewed including injury to bladder or urethra, UTI, urinary retention and hematuria.  - Sling: The effectiveness of a midurethral vaginal mesh sling is approximately 85%, and thus, there will be times when you may leak urine after surgery, especially if your bladder is full or if you have a strong cough. There is a balance between making the sling tight enough to treat your leakage but not too tight so that you have long-term difficulty emptying your bladder. A mesh sling will not directly treat overactive bladder/urge incontinence and may worsen it.  There is an FDA safety notification on vaginal mesh procedures for prolapse but NOT mesh slings. We have extensive experience and training with mesh placement and we have close postoperative follow up to identify any potential complications from mesh. It is important to realize that this mesh is a permanent implant that cannot be easily removed. There are rare risks of mesh exposure (2-4%), pain with intercourse (0-7%), and infection (<1%). The risk of mesh exposure if more likely in a woman with risks for poor healing (prior radiation, poorly controlled diabetes, or immunocompromised). The risk of new or worsened chronic pain  after mesh implant is more common in women with baseline chronic pain and/or poorly controlled anxiety or depression. Approximately 2-4% of patients will experience longer-term post-operative voiding dysfunction that may require surgical revision of the sling. We also reviewed that postoperatively, her stream may not be as strong as before surgery.  - continue efforts of weight reduction - patient desires to proceed with periurethral bulking

## 2023-10-14 NOTE — Assessment & Plan Note (Signed)
-   POCT UA + protein with Cr 1.11 on 02/12/21. Will repeat Cr prior to 3rd line therapy - encouraged caffeine reduction, timed voids, and bladder training - We discussed the symptoms of overactive bladder (OAB), which include urinary urgency, urinary frequency, nocturia, with or without urge incontinence.  While we do not know the exact etiology of OAB, several treatment options exist. We discussed management including behavioral therapy (decreasing bladder irritants, urge suppression strategies, timed voids, bladder retraining), physical therapy, medication; for refractory cases posterior tibial nerve stimulation, sacral neuromodulation, and intravesical botulinum toxin injection.  For anticholinergic medications, we discussed the potential side effects of anticholinergics including dry eyes, dry mouth, constipation, cognitive impairment and urinary retention. For Beta-3 agonist medication, we discussed the potential side effect of elevated blood pressure which is more likely to occur in individuals with uncontrolled hypertension. - minimal relief with Gemtesa, switch to mirabegron and monitor clinical change. Advised pt to monitor blood pressure for 1 week after starting mirabegron and discontinue if BP is elevated.  - For refractory OAB we reviewed the procedure for intravesical Botox injection with cystoscopy in the office and reviewed the risks, benefits and alternatives of treatment including but not limited to infection, need for self-catheterization and need for repeat therapy.  We discussed that there is a 5-15% chance of needing to catheterize with Botox and that this usually resolves in a few months; however can persist for longer periods of time.  Typically Botox injections would need to be repeated every 3-12 months since this is not a permanent therapy.   We discussed the role of sacral neuromodulation and how it works. It requires a test phase, and documentation of bladder function via diary.  After a successful test period, a permanent wire and generator are placed in the OR. The battery lasts 5 years on average and would need to be replaced surgically.  The goal of this therapy is at least a 50% improvement in symptoms. It is NOT realistic to expect a 100% cure.  We reviewed the fact that about 30% of patients fail the test phase and are not candidates for permanent generator placement.  We discussed the risk of infection and that the patient would not be able to get an MRI once the device is placed. There are two companies that provide this therapy: [employer]tr[employer] and [employer]. [employer]' product is new and is similar to Medtronic's, but has advantages of a smaller and rechargeable battery and being able to have an MRI with the implant. For all procedures, we discussed risks of bleeding, infection, damage to surrounding organs including bowel, bladder, blood vessels, ureters and nerves, need for further surgery, risk of postoperative urinary incontinence or retention with need to catheterize, recurrent prolapse, numbness and weakness at any body site, buttock pain, and the rarer risks of blood clot, heart attack, pneumonia, death.    We also discussed the role of percutaneous tibial nerve stimulation and how it works.  She understands it requires 12 weekly visits for temporary neuromodulation of the sacral nerve roots via the tibial nerve and that she may then require continued tapered treatment.  She will return for the procedure. All questions were answered.  - UDS 10/06/23 DOI with movement, normal PVR and sustained detrusor contraction during pressure flow.  - pt desires to proceed with botox injection, underwent CIC teaching without difficulty today.

## 2023-10-14 NOTE — Patient Instructions (Addendum)
Please call your primary care provider regarding your low blood pressure on diuretics and gastroenterology referral.    For Beta-3 agonist medication,there is a potential side effect of elevated blood pressure which is more likely to occur in individuals with uncontrolled hypertension. It appears that your most recent blood pressure is low. Please monitor your blood pressure and stop the medication if you experience any headache, chest discomfort, or shortness of breath and seek care immediately.  I have sent your prescription of mirabegron to your pharmacy. Start at 50mg  daily and report your blood pressures to our office in 1-2 weeks.  Congratulations on your weight loss! Keep going!  Women should try to eat at least 21 to 25 grams of fiber a day, while men should aim for 30 to 38 grams a day. You can add fiber to your diet with food or a fiber supplement such as psyllium (metamucil), benefiber, or fibercon.   Here's a look at how much dietary fiber is found in some common foods. When buying packaged foods, check the Nutrition Facts label for fiber content. It can vary among brands.  Fruits Serving size Total fiber (grams)*  Raspberries 1 cup 8.0  Pear 1 medium 5.5  Apple, with skin 1 medium 4.5  Banana 1 medium 3.0  Orange 1 medium 3.0  Strawberries 1 cup 3.0   Vegetables Serving size Total fiber (grams)*  Green peas, boiled 1 cup 9.0  Broccoli, boiled 1 cup chopped 5.0  Turnip greens, boiled 1 cup 5.0  Brussels sprouts, boiled 1 cup 4.0  Potato, with skin, baked 1 medium 4.0  Sweet corn, boiled 1 cup 3.5  Cauliflower, raw 1 cup chopped 2.0  Carrot, raw 1 medium 1.5   Grains Serving size Total fiber (grams)*  Spaghetti, whole-wheat, cooked 1 cup 6.0  Barley, pearled, cooked 1 cup 6.0  Bran flakes 3/4 cup 5.5  Quinoa, cooked 1 cup 5.0  Oat bran muffin 1 medium 5.0  Oatmeal, instant, cooked 1 cup 5.0  Popcorn, air-popped 3 cups 3.5  Brown rice, cooked 1 cup 3.5  Bread,  whole-wheat 1 slice 2.0  Bread, rye 1 slice 2.0   Legumes, nuts and seeds Serving size Total fiber (grams)*  Split peas, boiled 1 cup 16.0  Lentils, boiled 1 cup 15.5  Black beans, boiled 1 cup 15.0  Baked beans, canned 1 cup 10.0  Chia seeds 1 ounce 10.0  Almonds 1 ounce (23 nuts) 3.5  Pistachios 1 ounce (49 nuts) 3.0  Sunflower kernels 1 ounce 3.0  *Rounded to nearest 0.5 gram. Source: [employer] for [employer]	[place]

## 2023-10-14 NOTE — Assessment & Plan Note (Signed)
-   prior left sided pelvic floor myofascial pain on repeated exams - pelvic floor PT difficult due to limited access - discussed possible need for additional treatment after treatment of mixed UI due to persistent pain after anti-incontinence procedures

## 2023-10-14 NOTE — Assessment & Plan Note (Signed)
-   avoid fluid intake after 6pm - elevated feet during the day or use compression socks to reduce lower extremity swelling - reduction of night time frequency when she stopped diuretic  - history of snoring and OSA, pending to sleep center appt for CPAP fitting

## 2023-10-14 NOTE — Assessment & Plan Note (Signed)
-   lost 8lbs with diet modification - desires to return to weight of 130s - encouraged to continue diet modification and active lifestyle

## 2023-10-16 DIAGNOSIS — Z6841 Body Mass Index (BMI) 40.0 and over, adult: Secondary | ICD-10-CM | POA: Diagnosis not present

## 2023-10-16 DIAGNOSIS — I959 Hypotension, unspecified: Secondary | ICD-10-CM | POA: Diagnosis not present

## 2023-10-16 DIAGNOSIS — E039 Hypothyroidism, unspecified: Secondary | ICD-10-CM | POA: Diagnosis not present

## 2023-10-16 DIAGNOSIS — E079 Disorder of thyroid, unspecified: Secondary | ICD-10-CM | POA: Diagnosis not present

## 2023-10-16 DIAGNOSIS — R600 Localized edema: Secondary | ICD-10-CM | POA: Diagnosis not present

## 2023-10-19 ENCOUNTER — Ambulatory Visit: Payer: PPO | Admitting: Neurology

## 2023-10-19 ENCOUNTER — Encounter: Payer: Self-pay | Admitting: Neurology

## 2023-10-19 VITALS — BP 120/71 | HR 67 | Ht 62.0 in | Wt 235.0 lb

## 2023-10-19 DIAGNOSIS — Z6841 Body Mass Index (BMI) 40.0 and over, adult: Secondary | ICD-10-CM | POA: Diagnosis not present

## 2023-10-19 DIAGNOSIS — R0601 Orthopnea: Secondary | ICD-10-CM | POA: Diagnosis not present

## 2023-10-19 DIAGNOSIS — F5104 Psychophysiologic insomnia: Secondary | ICD-10-CM | POA: Diagnosis not present

## 2023-10-19 DIAGNOSIS — E66813 Obesity, class 3: Secondary | ICD-10-CM

## 2023-10-19 DIAGNOSIS — K219 Gastro-esophageal reflux disease without esophagitis: Secondary | ICD-10-CM

## 2023-10-19 DIAGNOSIS — G4759 Other parasomnia: Secondary | ICD-10-CM

## 2023-10-19 DIAGNOSIS — G4730 Sleep apnea, unspecified: Secondary | ICD-10-CM

## 2023-10-19 DIAGNOSIS — R351 Nocturia: Secondary | ICD-10-CM

## 2023-10-19 DIAGNOSIS — G4719 Other hypersomnia: Secondary | ICD-10-CM | POA: Diagnosis not present

## 2023-10-19 HISTORY — DX: Psychophysiologic insomnia: F51.04

## 2023-10-19 HISTORY — DX: Obesity, class 3: E66.813

## 2023-10-19 HISTORY — DX: Orthopnea: R06.01

## 2023-10-19 HISTORY — DX: Sleep apnea, unspecified: G47.30

## 2023-10-19 HISTORY — DX: Body Mass Index (BMI) 40.0 and over, adult: Z684

## 2023-10-19 HISTORY — DX: Other hypersomnia: G47.19

## 2023-10-19 MED ORDER — ALPRAZOLAM 0.25 MG PO TABS: 0.2500 mg | ORAL_TABLET | Freq: Every evening | ORAL | 0 refills | Status: DC | PRN

## 2023-10-19 NOTE — Patient Instructions (Signed)
Insomnia Insomnia is a sleep disorder that makes it difficult to fall asleep or stay asleep. Insomnia can cause fatigue, low energy, difficulty concentrating, mood swings, and poor performance at work or school. There are three different ways to classify insomnia: Difficulty falling asleep. Difficulty staying asleep. Waking up too early in the morning. Any type of insomnia can be long-term (chronic) or short-term (acute). Both are common. Short-term insomnia usually lasts for 3 months or less. Chronic insomnia occurs at least three times a week for longer than 3 months. What are the causes? Insomnia may be caused by another condition, situation, or substance, such as: Having certain mental health conditions, such as anxiety and depression. Using caffeine, alcohol, tobacco, or drugs. Having gastrointestinal conditions, such as gastroesophageal reflux disease (GERD). Having certain medical conditions. These include: Asthma. Alzheimer's disease. Stroke. Chronic pain. An overactive thyroid gland (hyperthyroidism). Other sleep disorders, such as restless legs syndrome and sleep apnea. Menopause. Sometimes, the cause of insomnia may not be known. What increases the risk? Risk factors for insomnia include: Gender. Females are affected more often than males. Age. Insomnia is more common as people get older. Stress and certain medical and mental health conditions. Lack of exercise. Having an irregular work schedule. This may include working night shifts and traveling between different time zones. What are the signs or symptoms? If you have insomnia, the main symptom is having trouble falling asleep or having trouble staying asleep. This may lead to other symptoms, such as: Feeling tired or having low energy. Feeling nervous about going to sleep. Not feeling rested in the morning. Having trouble concentrating. Feeling irritable, anxious, or depressed. How is this diagnosed? This condition  may be diagnosed based on: Your symptoms and medical history. Your health care provider may ask about: Your sleep habits. Any medical conditions you have. Your mental health. A physical exam. How is this treated? Treatment for insomnia depends on the cause. Treatment may focus on treating an underlying condition that is causing the insomnia. Treatment may also include: Medicines to help you sleep. Counseling or therapy. Lifestyle adjustments to help you sleep better. Follow these instructions at home: Eating and drinking  Limit or avoid alcohol, caffeinated beverages, and products that contain nicotine and tobacco, especially close to bedtime. These can disrupt your sleep. Do not eat a large meal or eat spicy foods right before bedtime. This can lead to digestive discomfort that can make it hard for you to sleep. Sleep habits  Keep a sleep diary to help you and your health care provider figure out what could be causing your insomnia. Write down: When you sleep. When you wake up during the night. How well you sleep and how rested you feel the next day. Any side effects of medicines you are taking. What you eat and drink. Make your bedroom a dark, comfortable place where it is easy to fall asleep. Put up shades or blackout curtains to block light from outside. Use a white noise machine to block noise. Keep the temperature cool. Limit screen use before bedtime. This includes: Not watching TV. Not using your smartphone, tablet, or computer. Stick to a routine that includes going to bed and waking up at the same times every day and night. This can help you fall asleep faster. Consider making a quiet activity, such as reading, part of your nighttime routine. Try to avoid taking naps during the day so that you sleep better at night. Get out of bed if you are still awake after  15 minutes of trying to sleep. Keep the lights down, but try reading or doing a quiet activity. When you feel  sleepy, go back to bed. General instructions Take over-the-counter and prescription medicines only as told by your health care provider. Exercise regularly as told by your health care provider. However, avoid exercising in the hours right before bedtime. Use relaxation techniques to manage stress. Ask your health care provider to suggest some techniques that may work well for you. These may include: Breathing exercises. Routines to release muscle tension. Visualizing peaceful scenes. Make sure that you drive carefully. Do not drive if you feel very sleepy. Keep all follow-up visits. This is important. Contact a health care provider if: You are tired throughout the day. You have trouble in your daily routine due to sleepiness. You continue to have sleep problems, or your sleep problems get worse. Get help right away if: You have thoughts about hurting yourself or someone else. Get help right away if you feel like you may hurt yourself or others, or have thoughts about taking your own life. Go to your nearest emergency room or: Call 911. Call the National Suicide Prevention Lifeline at (845)384-2429 or 988. This is open 24 hours a day. Text the Crisis Text Line at (914)351-5067. Summary Insomnia is a sleep disorder that makes it difficult to fall asleep or stay asleep. Insomnia can be long-term (chronic) or short-term (acute). Treatment for insomnia depends on the cause. Treatment may focus on treating an underlying condition that is causing the insomnia. Keep a sleep diary to help you and your health care provider figure out what could be causing your insomnia. This information is not intended to replace advice given to you by your health care provider. Make sure you discuss any questions you have with your health care provider. Document Revised: 08/12/2021 Document Reviewed: 08/12/2021 Elsevier Patient Education  2024 Elsevier Inc. Sleep routines:  Set a bed time and rise time, prep 45  minutes before bed time- no screen light in bed, no illuminated clock, turn the clock around, take a hot shower, soft warm light is allowed. Background white noise is allowed.     ASSESSMENT AND PLAN 58 y.o. year old female  here with:  Excessive daytime sleepiness, but also chronic insomnia, frequent nocturia and neuropathy/ RLS>     1)  a history of unverified  previous dx of OSA, the test was a HST directed by a dental office.   2) risk factors for OSA are : obesity, crossbite, neck size and trend to have nasal congestion.   3) witnessed loud snoring and apnea per family.   4) orthopnea- sleeps with head elevated , which  helps deeper breathing and reduced GERD.   5) history of learning disability, math and reading affected - not a HS graduate.   6) breathing worse after COVID . Orthopnea- and loss of taste, reduced sense of taste.    I ordered a sleep study in lab for this patient due to her reported screaming and talking in sleep, history of vivid dreams, and cardiac risk factors. I like to SPLIT at AHI 20?h if she is producing an AHI lower than that, will follow with titration.  H she reports shortness of breath with minimal exertion.   Please use Bed 2 for bariatric patient with orthopnea.  At risk for hypoxemia.   I plan to follow up through our NP within 3-5 months.   I would like to thank Lucianne Lei, MD and Olena Leatherwood,  Collene Mares, Md [address] [place],  [place] [address] for allowing me to meet with and to take care of this pleasant patient.

## 2023-10-19 NOTE — Progress Notes (Unsigned)
PA request was submitted through [employer] fax at [phone] PA request fax for procedure : Urethral Bulking and Botox CPT code: 09811,B1478 and [id number], [phone] PA is needed for this procedure. PA request was faxed and is PENDING

## 2023-10-19 NOTE — Progress Notes (Signed)
SLEEP MEDICINE CLINIC    Provider:  Melvyn Novas, MD  Primary Care Physician:  Kelly Lei, MD [address] [place] [address]     Referring Provider: Loleta Chance, Md [address] [place],  [place] [address]          Chief Complaint according to patient   Patient presents with:     New Sleep Patient (Initial Visit)           HISTORY OF PRESENT ILLNESS:  Kelly Hansen is a 58 y.o. female patient who is seen upon referral  by PCP on 10/19/2023 for a untreated sleep apnea. .  Chief concern according to patient : " Kelly Hansen is a 58 year old right-handed Caucasian female referred by Kelly Hansen for evaluation of possibly existing sleep apnea.  This patient carries also the diagnosis of overactive bladder and urinary frequency stress incontinence, mild coronary artery disease, obesity, low back pain, osteoarthritis, and neurocognitive disorder.  She  had Covid 2 times, the first time  in 2020, with hospitalization, the last time in 06-2023.  She reports that over 3 years ago she was evaluated at a dental office for sleep apnea and supposedly tested positive.  The office was geared towards dental devices being applied for the correction and treatment of obstructive sleep apnea.  She did not follow-up with that treatment and we cannot get to the records as the office is no longer existing.     Sleep relevant medical history: Nocturia - 2-4 times, Sleep walking/ talking in childhood, night terrors.  Screaming in her sleep- amnestic for this.  Sometimes Insomnia, chronic - sleep initiation.  , deviated septum and sinus surgery.   Family medical /sleep history:My son, brother, my maternal aunts and maternal cousin with OSA and on CPAP , Insomnia, sleep walkers.    Social history:  Patient is not working outside the home,  Insurance risk surveyor; retired from [employer], and was a Engineer, materials for 2 years-   and lives in a household with spouse and 3 children, step  grandchildren.  Pets are present. 3 dogs ,  Tobacco use; lives with smokers- she  quit in 2014.   ETOH use ; none now- ,  Caffeine intake in form of Coffee( 2-3 cups in AM ) Soda( /) Tea ( one glass at dinner) or energy drinks Exercise in form of housework, too tired for exercises and hobbies. .        Sleep habits are as follows: The patient's dinner time is  a snack between 4-5  PM. The patient goes to bed at 10 PM and continues to  struggle with sleep , asleep at midnight, sleeping  for 2.30  hours, wakes at 3.30 and back asleep at 4 AM and then up at 5.30 . Frequently interrupted  by  bathroom breaks, the first time at 1 AM.   The preferred sleep position is supine - due to back pain- on 4 pillows- , with the support of an elevated bed-  Dreams are reportedly  frequent/vivid.   The patient wakes up spontaneously/ 5.30  AM is the usual rise time.  She reports not feeling refreshed or restored in AM, with symptoms such as a very  dry mouth, some morning headaches, and residual fatigue.  Naps are taken infrequently, l she cannot fall asleep easily-  but then she has been told by her family that's he falls asleep for a couple of minutes.  Review of Systems: Out of a complete 14 system review, the patient complains of only the following symptoms, and all other reviewed systems are negative.:  Fatigue, sleepiness , snoring, fragmented sleep, Insomnia,   Active dreams, memory impairment. knee and hip pain,  Nocturia    How likely are you to doze in the following situations: 0 = not likely, 1 = slight Hansen, 2 = moderate Hansen, 3 = high Hansen   Sitting and Reading? Watching Television? Sitting inactive in a public place (theater or meeting)? As a passenger in a car for an hour without a break? Lying down in the afternoon when circumstances permit? Sitting and talking to someone? Sitting quietly after lunch without alcohol? In a car, while stopped for a few minutes in traffic?    Total = 22/ 24 points   FSS endorsed at 54/ 63 points.   Social History   Socioeconomic History   Marital status: Married    Spouse name: Kelly Hansen   Number of children: 3   Years of education: Not on file,    Highest education level:  11 grade HS drop out, learning disability, math  and couldn't retain anything she had read.   Occupational History   Not on file  Tobacco Use   Smoking status: Former    Current packs/day: 0.00    Types: Cigarettes    Quit date: 03/10/2011    Years since quitting: 12.6   Smokeless tobacco: Never  Vaping Use   Vaping status: Never Used  Substance and Sexual Activity   Alcohol use: No   Drug use: No   Sexual activity: Yes  Other Topics Concern   Not on file  Social History Narrative   Right handed   Lives with family    One story home few steps to get into home    Social Drivers of Health   Financial Resource Strain: Not on file  Food Insecurity: Not on file  Transportation Needs: Not on file  Physical Activity: Not on file  Stress: Not on file  Social Connections: Not on file    Family History  Problem Relation Age of Onset   Dementia Mother    Stroke Mother    Hypertension Mother    Asthma Mother    Hyperlipidemia Mother    Melanoma Mother    Diabetes Mother    Heart disease Mother    Kidney disease Mother    Skin cancer Father    Rectal cancer Father    Colon cancer Father    Dementia Sister    Asthma Sister    Cirrhosis Brother    Hypertension Brother    Hyperlipidemia Brother    Diabetes Brother    Heart disease Brother    Kidney disease Brother    Dementia Maternal Aunt    Dementia Maternal Uncle    Ovarian cancer Paternal Aunt    Thyroid cancer Niece    Bladder Cancer Neg Hx    Uterine cancer Neg Hx     Past Medical History:  Diagnosis Date   Acid reflux    Angina pectoris (HCC) 02/12/2021   Arthritis of multiple sites    Arthropathy of lumbar facet joint 06/17/2022   Asthma    Atherosclerosis of  aorta (HCC)    Chest pain of uncertain etiology 04/11/2021   CIN III with severe dysplasia 10/06/2016   Formatting of this note might be different from the original. Reports [facility] between 1991-2004 for abnormal Pap followed by Pap  q28mos then q45mos She then had TVH in 2004 with Dr. Thamas Jaegers Needs Pap smears for 20-25y post-hysterectomy due to presumed h/o CIN 2-3   Degeneration of lumbar intervertebral disc 03/30/2020   Depression    Fibromyalgia    GERD (gastroesophageal reflux disease)    Hyperlipidemia    Hypothyroid    Increased body mass index 03/30/2020   Insomnia    Lumbar radiculopathy 06/24/2022   Lumbar spondylosis 06/17/2022   Mild CAD 04/11/2021   Morbid obesity (HCC) 02/12/2021   Peripheral neuropathy    Thyroid disease    Umbilical hernia    Vitamin D deficiency     Past Surgical History:  Procedure Laterality Date   ABDOMINAL HYSTERECTOMY     APPENDECTOMY     BIOPSY THYROID  08/26/2023   LEFT HEART CATH AND CORONARY ANGIOGRAPHY N/A 02/13/2021   Procedure: LEFT HEART CATH AND CORONARY ANGIOGRAPHY;  Surgeon: Tonny Bollman, MD;  Location: [facility] INVASIVE CV LAB;  Service: Cardiovascular;  Laterality: N/A;     Current Outpatient Medications on File Prior to Visit  Medication Sig Dispense Refill   albuterol (VENTOLIN HFA) 108 (90 Base) MCG/ACT inhaler Inhale 2 puffs into the lungs 3 (three) times daily as needed for wheezing or shortness of breath.     donepezil (ARICEPT) 10 MG tablet Take 10 mg by mouth at bedtime.     DULoxetine (CYMBALTA) 60 MG capsule Take 60 mg by mouth in the morning.     ergocalciferol (VITAMIN D2) 1.25 MG (50000 UT) capsule Take 1 capsule by mouth once a week. 12 capsule 0   famotidine (PEPCID) 20 MG tablet Take 1 (one) tablet by mouth two times daily. 180 tablet 0   Fluticasone-Umeclidin-Vilant (TRELEGY ELLIPTA) 100-62.5-25 MCG/ACT AEPB Inhale 1 puff into the lungs daily.     furosemide (LASIX) 20 MG tablet Take 40-60 mg by mouth daily.      levothyroxine (SYNTHROID) 25 MCG tablet Take 25 mcg by mouth daily before breakfast.     meclizine (ANTIVERT) 12.5 MG tablet Take 12.5 mg by mouth 2 (two) times daily as needed for dizziness.     meloxicam (MOBIC) 15 MG tablet Take 15 mg by mouth daily.     memantine (NAMENDA) 10 MG tablet Take 10 mg by mouth 2 (two) times daily.     mirabegron ER (MYRBETRIQ) 50 MG TB24 tablet Take 1 tablet (50 mg total) by mouth daily. 30 tablet 1   montelukast (SINGULAIR) 10 MG tablet Take 1 tablet by mouth at bedtime. 90 tablet 0   nitroGLYCERIN (NITROSTAT) 0.4 MG SL tablet Take 1 (one) Tablet as needed for chest pain-If not better in 5 minutes repeat x 2--If still not better--seek attention at ER/ Call 911 25 tablet 0   olopatadine (PATANOL) 0.1 % ophthalmic solution Place 1 drop into each eye as directed 10 mL 0   pantoprazole (PROTONIX) 40 MG tablet Take 40 mg by mouth in the morning.     potassium chloride (KLOR-CON) 10 MEQ tablet Take 10 mEq by mouth 2 (two) times daily.     pramipexole (MIRAPEX) 0.125 MG tablet Take 0.125 mg by mouth 2 (two) times daily.     pregabalin (LYRICA) 100 MG capsule Take 100 mg by mouth 3 (three) times daily.     rosuvastatin (CRESTOR) 10 MG tablet Take 10 mg by mouth at bedtime.     tiZANidine (ZANAFLEX) 4 MG tablet Take 4 mg by mouth 2 (two) times daily.     traZODone (DESYREL) 50  MG tablet Take 50 mg by mouth at bedtime as needed for sleep.     No current facility-administered medications on file prior to visit.    Allergies  Allergen Reactions   Penicillins Rash   Sulfa Antibiotics Nausea And Vomiting and Rash     DIAGNOSTIC DATA (LABS, IMAGING, TESTING) - I reviewed patient records, labs, notes, testing and imaging myself where available.  Lab Results  Component Value Date   WBC 8.4 02/12/2021   HGB 14.5 02/12/2021   HCT 43.9 02/12/2021   MCV 91 02/12/2021   PLT 302 02/12/2021      Component Value Date/Time   NA 140 02/12/2021 1141   K 4.4 02/12/2021  1141   CL 100 02/12/2021 1141   CO2 24 02/12/2021 1141   GLUCOSE 82 02/12/2021 1141   GLUCOSE 104 (H) 10/04/2019 1905   BUN 14 02/12/2021 1141   CREATININE 1.11 (H) 02/12/2021 1141   CALCIUM 9.7 02/12/2021 1141   PROT 7.9 10/04/2019 1905   ALBUMIN 4.4 10/04/2019 1905   AST 36 10/04/2019 1905   ALT 31 10/04/2019 1905   ALKPHOS 66 10/04/2019 1905   BILITOT 1.1 10/04/2019 1905   GFRNONAA >60 10/04/2019 1905   GFRAA >60 10/04/2019 1905   No results found for: "CHOL", "HDL", "LDLCALC", "LDLDIRECT", "TRIG", "CHOLHDL" No results found for: "HGBA1C" Lab Results  Component Value Date   VITAMINB12 236 11/29/2019   No results found for: "TSH"  PHYSICAL EXAM:  Today's Vitals   10/19/23 1001  BP: 120/71  Pulse: 67  Weight: 235 lb (106.6 kg)  Height: 5\' 2"  (1.575 m)   Body mass index is 42.98 kg/m.   Wt Readings from Last 3 Encounters:  10/19/23 235 lb (106.6 kg)  08/20/23 238 lb (108 kg)  08/12/23 236 lb (107 kg)     Ht Readings from Last 3 Encounters:  10/19/23 5\' 2"  (1.575 m)  08/20/23 5' (1.524 m)  08/12/23 5\' 2"  (1.575 m)      General: The patient is awake, alert and appears not in acute distress. The patient is groomed. Head: Normocephalic, atraumatic. Neck is supple.  Mallampati :3,  neck circumference:16.5  inches .  Nasal airflow open/ patent.  Retrognathia is not  seen.   She has a crossbite !  Dental status: biological  Cardiovascular:  Regular rate and cardiac rhythm by pulse,  without distended neck veins. Respiratory: Lungs are clear to auscultation.  Skin:  Without evidence of ankle edema, or rash. Trunk: The patient's posture is erect.   NEUROLOGIC EXAM: The patient is awake and alert, oriented to place and time.   Memory subjective described as intact.  She never had a good memory , she tells me.  Attention span & concentration ability appears normal.  Speech is fluent,  without dysarthria, dysphonia or aphasia.  Mood and affect are appropriate.    Cranial nerves: loss of taste reported -  Pupils are equal and briskly reactive to light. Funduscopic exam deferred. .  Extraocular movements in vertical and horizontal planes were intact and without nystagmus. No Diplopia. Visual fields by finger perimetry are intact. Hearing was intact to soft voice and finger rubbing.    Facial sensation intact to fine touch.  Facial motor strength is symmetric and tongue and uvula move midline.  Neck ROM : rotation, tilt and flexion extension were normal for age and shoulder shrug was symmetrical.    Motor exam:  Symmetric bulk, tone and ROM.   Normal tone without cog -wheeling,  symmetric grip strength .   Sensory:  Fine touch, and vibration were tested  in the upper extremities: intact.  vibration was not felt at the lateral ankles.    Coordination: Rapid alternating movements in the fingers/hands were of normal speed.  The Finger-to-nose maneuver was intact without evidence of ataxia, dysmetria or tremor.   Gait and station: Patient could rise unassisted from a seated position, walked without assistive device.  Stance is of wider  width/ base and the patient turned with 4 steps.  Toe and heel walk were deferred.  Deep tendon reflexes: in the  upper and lower extremities are symmetric and intact.  Babinski response was deferred.     ASSESSMENT AND PLAN 58 y.o. year old female  here with:  Excessive daytime sleepiness, but also chronic insomnia, frequent nocturia and neuropathy/ RLS>     1)  a history of unverified  previous dx of OSA, the test was a HST directed by a dental office.   2) risk factors for OSA are : obesity, crossbite, neck size and trend to have nasal congestion.   3) witnessed loud snoring and apnea per family.   4) orthopnea- sleeps with head elevated , which  helps deeper breathing and reduced GERD.   5) history of learning disability, math and reading affected - not a HS graduate.   6) breathing worse after COVID .  Orthopnea- and loss of taste, reduced sense of taste.    I ordered a sleep study in lab for this patient due to her reported screaming and talking in sleep, history of vivid dreams, and cardiac risk factors. I like to SPLIT at AHI 20?h if she is producing an AHI lower than that, will follow with titration.  H she reports shortness of breath with minimal exertion.   Please use Bed 2 for bariatric patient with orthopnea.  At risk for hypoxemia.   I plan to follow up through our NP within 3-5 months.   I would like to thank Kelly Lei, MD and Kelly Chance, Md [address] [place],  [place] [address] for allowing me to meet with and to take care of this pleasant patient.     After spending a total time of  45  minutes face to face and additional time for physical and neurologic examination, review of laboratory studies,  personal review of imaging studies, reports and results of other testing and review of referral information / records as far as provided in visit,   Electronically signed by: Kelly Novas, MD 10/19/2023 10:12 AM  [facility] Board certified by The [employer] of Sleep Medicine and Diplomate of the [employer] of [employer]. Board certified In Neurology through the ABPN, Fellow of the [employer] of Neu[employer].

## 2023-10-20 ENCOUNTER — Telehealth: Payer: Self-pay | Admitting: Neurology

## 2023-10-20 NOTE — Telephone Encounter (Signed)
Split HTA pending

## 2023-10-22 DIAGNOSIS — H2513 Age-related nuclear cataract, bilateral: Secondary | ICD-10-CM | POA: Diagnosis not present

## 2023-10-22 DIAGNOSIS — H16223 Keratoconjunctivitis sicca, not specified as Sjogren's, bilateral: Secondary | ICD-10-CM | POA: Diagnosis not present

## 2023-10-22 DIAGNOSIS — H25042 Posterior subcapsular polar age-related cataract, left eye: Secondary | ICD-10-CM | POA: Diagnosis not present

## 2023-10-23 DIAGNOSIS — R600 Localized edema: Secondary | ICD-10-CM | POA: Diagnosis not present

## 2023-10-23 DIAGNOSIS — E079 Disorder of thyroid, unspecified: Secondary | ICD-10-CM | POA: Diagnosis not present

## 2023-10-23 DIAGNOSIS — I959 Hypotension, unspecified: Secondary | ICD-10-CM | POA: Diagnosis not present

## 2023-10-23 DIAGNOSIS — E039 Hypothyroidism, unspecified: Secondary | ICD-10-CM | POA: Diagnosis not present

## 2023-10-23 DIAGNOSIS — Z6841 Body Mass Index (BMI) 40.0 and over, adult: Secondary | ICD-10-CM | POA: Diagnosis not present

## 2023-11-02 NOTE — Telephone Encounter (Signed)
Split HTA Berkley Harvey: [id number] (exp. 10/20/23 to 01/13/24)

## 2023-11-09 DIAGNOSIS — Z6841 Body Mass Index (BMI) 40.0 and over, adult: Secondary | ICD-10-CM | POA: Diagnosis not present

## 2023-11-09 DIAGNOSIS — J019 Acute sinusitis, unspecified: Secondary | ICD-10-CM | POA: Diagnosis not present

## 2023-11-09 DIAGNOSIS — B001 Herpesviral vesicular dermatitis: Secondary | ICD-10-CM | POA: Diagnosis not present

## 2023-11-09 NOTE — Telephone Encounter (Signed)
 LVM for pt to call back to schedule.

## 2023-11-10 DIAGNOSIS — R635 Abnormal weight gain: Secondary | ICD-10-CM | POA: Diagnosis not present

## 2023-11-10 DIAGNOSIS — Z6841 Body Mass Index (BMI) 40.0 and over, adult: Secondary | ICD-10-CM | POA: Diagnosis not present

## 2023-11-12 NOTE — Telephone Encounter (Signed)
 Spoke with the patient. She stated she will call back to schedule her SS

## 2023-11-16 ENCOUNTER — Institutional Professional Consult (permissible substitution): Payer: Medicare HMO | Admitting: Neurology

## 2023-11-17 ENCOUNTER — Telehealth: Payer: Self-pay

## 2023-11-20 ENCOUNTER — Other Ambulatory Visit: Payer: Self-pay | Admitting: Nurse Practitioner

## 2023-11-20 DIAGNOSIS — E079 Disorder of thyroid, unspecified: Secondary | ICD-10-CM

## 2023-11-23 ENCOUNTER — Other Ambulatory Visit: Payer: Self-pay | Admitting: Obstetrics

## 2023-11-23 DIAGNOSIS — N393 Stress incontinence (female) (male): Secondary | ICD-10-CM

## 2023-11-23 DIAGNOSIS — N3281 Overactive bladder: Secondary | ICD-10-CM

## 2023-11-23 MED ORDER — CEPHALEXIN 500 MG PO CAPS: 500.0000 mg | ORAL_CAPSULE | Freq: Four times a day (QID) | ORAL | 0 refills | Status: DC

## 2023-11-23 NOTE — Progress Notes (Signed)
 Start Keflex morning of procedure. Patient to stop the medication and seek urgency evaluation if she experiences any rash, difficulty swallowing or breathing.  Sulfa allergy and drug interaction with Cipro.

## 2023-11-24 ENCOUNTER — Telehealth: Payer: Self-pay

## 2023-11-24 ENCOUNTER — Other Ambulatory Visit: Payer: Self-pay | Admitting: Nurse Practitioner

## 2023-11-24 DIAGNOSIS — E079 Disorder of thyroid, unspecified: Secondary | ICD-10-CM

## 2023-11-24 NOTE — Telephone Encounter (Signed)
@  [email or web address]@ is called to provide pre-procedural instructions for her Bladder Botox and bulking. She is scheduled on 11/26/2023.  The patient is not having any worsening in her urinary patterns. The patient is not experiencing any UTI symptoms. A POCT urinalysis is to be performed in the office before the scheduled procedure as the procedure will need to be rescheduled if the results indicate an infection.  The patient has been prescribed the pre-procedural antibiotics for bladder botox and urethral bulking. The patient is to take the prescribed antibiotics the morning of the procedure.  The patient is reminded to arrive 5 minutes prior to the scheduled appointment time and that a urine sample will need to be collected the upon arrival for the procedure.

## 2023-11-26 ENCOUNTER — Ambulatory Visit: Payer: PPO | Admitting: Obstetrics

## 2023-11-26 ENCOUNTER — Encounter: Payer: Self-pay | Admitting: Obstetrics

## 2023-11-26 VITALS — BP 104/64 | HR 72

## 2023-11-26 DIAGNOSIS — N3281 Overactive bladder: Secondary | ICD-10-CM

## 2023-11-26 DIAGNOSIS — R350 Frequency of micturition: Secondary | ICD-10-CM

## 2023-11-26 DIAGNOSIS — N393 Stress incontinence (female) (male): Secondary | ICD-10-CM

## 2023-11-26 LAB — POCT URINALYSIS DIPSTICK
Blood, UA: NEGATIVE
Glucose, UA: NEGATIVE
Ketones, UA: POSITIVE
Leukocytes, UA: NEGATIVE
Nitrite, UA: NEGATIVE
Protein, UA: POSITIVE — AB
Spec Grav, UA: >=1.03 — AB (ref 1.010–1.025)
Urobilinogen, UA: 1 U/dL
pH, UA: 5.5 (ref 5.0–8.0)

## 2023-11-26 MED ORDER — CEPHALEXIN 500 MG PO CAPS: 500.0000 mg | ORAL_CAPSULE | Freq: Once | ORAL | Status: DC

## 2023-11-26 MED ORDER — ONABOTULINUMTOXINA 100 UNITS IJ SOLR
100.0000 [IU] | Freq: Once | INTRAMUSCULAR | Status: AC
  Administered 2023-11-26: 100 [IU] via INTRAMUSCULAR

## 2023-11-26 MED ORDER — CEPHALEXIN 500 MG PO CAPS
500.0000 mg | ORAL_CAPSULE | Freq: Once | ORAL | Status: AC
  Administered 2023-11-26: 500 mg via ORAL

## 2023-11-26 NOTE — Progress Notes (Signed)
 Intravesical Botox Procedure:  58 y.o. yo F with OAB and SUI presenting today for intravesical botox and urethral bulking.   Vitals:   11/26/23 0809  BP: 104/64  Pulse: 72    Results for orders placed or performed in visit on 11/26/23 (from the past 24 hours)  POCT Urinalysis Dipstick     Status: Abnormal   Collection Time: 11/26/23  8:12 AM  Result Value Ref Range   Color, UA Dark Yellow    Clarity, UA Clear    Glucose, UA Negative Negative   Bilirubin, UA Small    Ketones, UA Positive    Spec Grav, UA >=1.030 (A) 1.010 - 1.025   Blood, UA Negative    pH, UA 5.5 5.0 - 8.0   Protein, UA Positive (A) Negative   Urobilinogen, UA 1.0 0.2 or 1.0 E.U./dL   Nitrite, UA Negative    Leukocytes, UA Negative Negative   Appearance     Odor       Prior to the procedure, the patient took Keflex for antibiotic prophylaxis (PCN, sulfa allergy and drug interaction to Cipro). Time out was performed. The bladder was catherized and 50 ml of 2% lidocaine was placed in the bladder and 10 ml of 2% lidocaine jelly placed in the urethra. A urethral block was performed by injecting 3ml of 1% lidocaine with epinephrine at 3 and 9 o'clock adjacent to the urethra.  After 30 minutes the lidocaine was drained. Cystoscopy was performed with sterile H2O and a 70 degree scope. Bladder mucosa was noted to be within normal limits. A total of 10 ml / 100 units of Botox A,  Lot # [id number] Exp 12/2025  was injected in the detrusor muscle via 5 injections of 2ml each. These were spaced about 1 cm apart. Care was taken to avoid the ureteral orifices and the trigone. Patient tolerated the procedure well.  The needle was primed.  The urethroscope was inserted to the level of the bladder neck.  The needle was inserted 2 cm and the scope was pulled back into the urethra 2 cm.  The needle was inserted bevel up at the 5 o'clock position and the Bulkamid was injected to obtain coaptation.  This was repeated at the 2 o'clock,  10  o'clock and 7 o'clock positions.   A total of 2- 1ml syringes were used and good circumferential coaptation was noted.  The patient tolerated the procedure well. She was asked to void after the procedure.  Post-Void Residual (PVR) by Bladder Scan: In order to evaluate bladder emptying, we discussed obtaining a postvoid residual and she agreed to this procedure.  Procedure: The ultrasound unit was placed on the patient's abdomen in the suprapubic region after the patient had voided. A PVR of 41 ml was obtained by bladder scan.   Post Void Residual - 11/26/23 0945       Post Void Residual   Post Void Residual 41 mL              ASSESSMENT: 58 y.o. y.o. 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   PLAN: Patient will follow up in 4 weeks to reassess. Voiding and post-procedure precautions were given. She will return for heavy bleeding, fevers, dysuria lasting beyond today and incomplete emptying.  All questions were answered.  Loleta Chance, MD

## 2023-11-26 NOTE — Addendum Note (Signed)
 Addended by: Lynann Bologna on: 11/26/2023 05:19 PM   Modules accepted: Orders

## 2023-11-26 NOTE — Patient Instructions (Signed)

## 2023-11-27 MED ORDER — LIDOCAINE HCL 2 % IJ SOLN
50.0000 mL | Freq: Once | INTRAMUSCULAR | Status: AC
  Administered 2023-11-26: 1000 mg

## 2023-11-27 NOTE — Addendum Note (Signed)
 Addended by: Alexis Frock on: 11/27/2023 09:31 AM   Modules accepted: Orders

## 2023-11-30 DIAGNOSIS — H1010 Acute atopic conjunctivitis, unspecified eye: Secondary | ICD-10-CM | POA: Diagnosis not present

## 2023-11-30 DIAGNOSIS — E559 Vitamin D deficiency, unspecified: Secondary | ICD-10-CM | POA: Diagnosis not present

## 2023-11-30 DIAGNOSIS — K219 Gastro-esophageal reflux disease without esophagitis: Secondary | ICD-10-CM | POA: Diagnosis not present

## 2023-11-30 DIAGNOSIS — M7989 Other specified soft tissue disorders: Secondary | ICD-10-CM | POA: Diagnosis not present

## 2023-11-30 DIAGNOSIS — M797 Fibromyalgia: Secondary | ICD-10-CM | POA: Diagnosis not present

## 2023-11-30 DIAGNOSIS — E785 Hyperlipidemia, unspecified: Secondary | ICD-10-CM | POA: Diagnosis not present

## 2023-11-30 DIAGNOSIS — N3281 Overactive bladder: Secondary | ICD-10-CM | POA: Diagnosis not present

## 2023-12-04 NOTE — Telephone Encounter (Signed)
 error

## 2023-12-14 ENCOUNTER — Emergency Department (HOSPITAL_COMMUNITY)

## 2023-12-14 ENCOUNTER — Encounter (HOSPITAL_COMMUNITY): Payer: Self-pay

## 2023-12-14 ENCOUNTER — Other Ambulatory Visit: Payer: Self-pay

## 2023-12-14 ENCOUNTER — Emergency Department (HOSPITAL_COMMUNITY)
Admission: EM | Admit: 2023-12-14 | Discharge: 2023-12-14 | Disposition: A | Discharge status: Home / Self Care | Attending: Emergency Medicine | Admitting: Emergency Medicine

## 2023-12-14 DIAGNOSIS — R0989 Other specified symptoms and signs involving the circulatory and respiratory systems: Secondary | ICD-10-CM | POA: Diagnosis not present

## 2023-12-14 DIAGNOSIS — R42 Dizziness and giddiness: Secondary | ICD-10-CM | POA: Diagnosis not present

## 2023-12-14 DIAGNOSIS — R531 Weakness: Secondary | ICD-10-CM | POA: Diagnosis not present

## 2023-12-14 DIAGNOSIS — R0902 Hypoxemia: Secondary | ICD-10-CM | POA: Diagnosis not present

## 2023-12-14 DIAGNOSIS — Z79899 Other long term (current) drug therapy: Secondary | ICD-10-CM | POA: Insufficient documentation

## 2023-12-14 DIAGNOSIS — I10 Essential (primary) hypertension: Secondary | ICD-10-CM | POA: Insufficient documentation

## 2023-12-14 DIAGNOSIS — E039 Hypothyroidism, unspecified: Secondary | ICD-10-CM | POA: Insufficient documentation

## 2023-12-14 DIAGNOSIS — I959 Hypotension, unspecified: Secondary | ICD-10-CM | POA: Insufficient documentation

## 2023-12-14 DIAGNOSIS — I517 Cardiomegaly: Secondary | ICD-10-CM | POA: Diagnosis not present

## 2023-12-14 LAB — CBC WITH DIFFERENTIAL/PLATELET
Abs Immature Granulocytes: 0.01 10*3/uL (ref 0.00–0.07)
Basophils Absolute: 0 10*3/uL (ref 0.0–0.1)
Basophils Relative: 1 %
Eosinophils Absolute: 0.2 10*3/uL (ref 0.0–0.5)
Eosinophils Relative: 4 %
HCT: 42.9 % (ref 36.0–46.0)
Hemoglobin: 13.7 g/dL (ref 12.0–15.0)
Immature Granulocytes: 0 %
Lymphocytes Relative: 38 %
Lymphs Abs: 2.2 10*3/uL (ref 0.7–4.0)
MCH: 30.1 pg (ref 26.0–34.0)
MCHC: 31.9 g/dL (ref 30.0–36.0)
MCV: 94.3 fL (ref 80.0–100.0)
Monocytes Absolute: 0.5 10*3/uL (ref 0.1–1.0)
Monocytes Relative: 8 %
Neutro Abs: 2.8 10*3/uL (ref 1.7–7.7)
Neutrophils Relative %: 49 %
Platelets: 204 10*3/uL (ref 150–400)
RBC: 4.55 MIL/uL (ref 3.87–5.11)
RDW: 13.2 % (ref 11.5–15.5)
WBC: 5.8 10*3/uL (ref 4.0–10.5)
nRBC: 0 % (ref 0.0–0.2)

## 2023-12-14 LAB — BASIC METABOLIC PANEL WITH GFR
Anion gap: 8 (ref 5–15)
BUN: 14 mg/dL (ref 6–20)
CO2: 25 mmol/L (ref 22–32)
Calcium: 9.8 mg/dL (ref 8.9–10.3)
Chloride: 109 mmol/L (ref 98–111)
Creatinine, Ser: 1.16 mg/dL — ABNORMAL HIGH (ref 0.44–1.00)
GFR, Estimated: 55 mL/min — ABNORMAL LOW (ref >60)
Glucose, Bld: 85 mg/dL (ref 70–99)
Potassium: 4.3 mmol/L (ref 3.5–5.1)
Sodium: 142 mmol/L (ref 135–145)

## 2023-12-14 LAB — TSH: TSH: 3.485 u[IU]/mL (ref 0.350–4.500)

## 2023-12-14 LAB — TROPONIN I (HIGH SENSITIVITY): Troponin I (High Sensitivity): 3 ng/L (ref <18)

## 2023-12-14 LAB — T4, FREE: Free T4: 0.78 ng/dL (ref 0.61–1.12)

## 2023-12-14 MED ORDER — SODIUM CHLORIDE 0.9 % IV BOLUS
1000.0000 mL | Freq: Once | INTRAVENOUS | Status: AC
  Administered 2023-12-14: 1000 mL via INTRAVENOUS

## 2023-12-14 NOTE — ED Provider Notes (Signed)
 [place] EMERGENCY DEPARTMENT AT [facility] Provider Note   CSN: [id number] Arrival date & time: 12/14/23  1352     History  Chief Complaint  Patient presents with   Weakness   Hypotension    Kelly Hansen is a 58 y.o. female past medical history significant for hypertension and hypothyroidism presents today for hypertension.  Patient has been dealing with fluctuating blood pressures since November 2024.  Patient endorses lightheadedness, chest pressure and fatigue.  Patient states that she has not taking a diuretic anymore.  Patient denies fever, chills, cough, congestion nausea, vomiting, diarrhea, or abdominal pain.   Weakness      Home Medications Prior to Admission medications   Medication Sig Start Date End Date Taking? Authorizing Provider  albuterol (VENTOLIN HFA) 108 (90 Base) MCG/ACT inhaler Inhale 2 puffs into the lungs 3 (three) times daily as needed for wheezing or shortness of breath.    [provider]  ALPRAZolam Prudy Feeler) 0.25 MG tablet Take 1 tablet (0.25 mg total) by mouth at bedtime as needed for sleep (sleep lab, bring this prescription to the sleep lab.). 10/19/23   Dohmeier, Porfirio Mylar, MD  donepezil (ARICEPT) 10 MG tablet Take 10 mg by mouth at bedtime. 06/01/19   [provider]  DULoxetine (CYMBALTA) 60 MG capsule Take 60 mg by mouth in the morning. 05/26/19   [provider]  ergocalciferol (VITAMIN D2) 1.25 MG (50000 UT) capsule Take 1 capsule by mouth once a week. 05/13/21     famotidine (PEPCID) 20 MG tablet Take 1 (one) tablet by mouth two times daily. 06/03/21     Fluticasone-Umeclidin-Vilant (TRELEGY ELLIPTA) 100-62.5-25 MCG/ACT AEPB Inhale 1 puff into the lungs daily.    [provider]  furosemide (LASIX) 20 MG tablet Take 40-60 mg by mouth daily.    [provider]  levothyroxine (SYNTHROID) 25 MCG tablet Take 25 mcg by mouth daily before breakfast.    [provider]  meclizine (ANTIVERT)  12.5 MG tablet Take 12.5 mg by mouth 2 (two) times daily as needed for dizziness.    [provider]  meloxicam (MOBIC) 15 MG tablet Take 15 mg by mouth daily.    [provider]  memantine (NAMENDA) 10 MG tablet Take 10 mg by mouth 2 (two) times daily.    [provider]  mirabegron ER (MYRBETRIQ) 50 MG TB24 tablet Take 1 tablet (50 mg total) by mouth daily. 09/04/23   Loleta Chance, MD  montelukast (SINGULAIR) 10 MG tablet Take 1 tablet by mouth at bedtime. 04/08/21     nitroGLYCERIN (NITROSTAT) 0.4 MG SL tablet Take 1 (one) Tablet as needed for chest pain-If not better in 5 minutes repeat x 2--If still not better--seek attention at ER/ Call 911 02/08/21     olopatadine (PATANOL) 0.1 % ophthalmic solution Place 1 drop into each eye as directed 03/13/21     pantoprazole (PROTONIX) 40 MG tablet Take 40 mg by mouth in the morning.    [provider]  potassium chloride (KLOR-CON) 10 MEQ tablet Take 10 mEq by mouth 2 (two) times daily.    [provider]  pramipexole (MIRAPEX) 0.125 MG tablet Take 0.125 mg by mouth 2 (two) times daily. 05/26/19   [provider]  pregabalin (LYRICA) 100 MG capsule Take 100 mg by mouth 3 (three) times daily.    [provider]  rosuvastatin (CRESTOR) 10 MG tablet Take 10 mg by mouth at bedtime. 05/24/19   [provider]  tiZANidine (ZANAFLEX) 4 MG tablet Take 4 mg by mouth 2 (two) times daily. 04/25/23   [provider]  traZODone (DESYREL) 50 MG tablet Take 50 mg by mouth at bedtime as needed for sleep.    [provider]      Allergies    Penicillins and Sulfa antibiotics    Review of Systems   Review of Systems  Constitutional:  Positive for fatigue.  Neurological:  Positive for light-headedness.    Physical Exam Updated Vital Signs BP 121/79   Pulse 60   Temp (!) 97.5 F (36.4 C) (Oral)   Resp 14   Ht 5\' 2"  (1.575 m)   Wt 98.4 kg   SpO2 100%   BMI 39.69 kg/m   Physical Exam Vitals and nursing note reviewed.  Constitutional:      General: She is not in acute distress.    Appearance: Normal appearance. She is well-developed. She is obese. She is not ill-appearing, toxic-appearing or diaphoretic.  HENT:     Head: Normocephalic and atraumatic.     Right Ear: External ear normal.     Left Ear: External ear normal.     Nose: Nose normal.     Mouth/Throat:     Mouth: Mucous membranes are moist.  Eyes:     Conjunctiva/sclera: Conjunctivae normal.  Cardiovascular:     Rate and Rhythm: Regular rhythm. Bradycardia present.     Pulses: Normal pulses.     Heart sounds: Normal heart sounds. No murmur heard. Pulmonary:     Effort: Pulmonary effort is normal. No respiratory distress.     Breath sounds: Normal breath sounds.  Abdominal:     Palpations: Abdomen is soft.     Tenderness: There is no abdominal tenderness.  Musculoskeletal:        General: No swelling.     Cervical back: Neck supple.     Right lower leg: No edema.     Left lower leg: No edema.  Skin:    General: Skin is warm and dry.     Capillary Refill: Capillary refill takes less than 2 seconds.  Neurological:     General: No focal deficit present.     Mental Status: She is alert and oriented to person, place, and time.     Motor: No weakness.  Psychiatric:        Mood and Affect: Mood normal.     ED Results / Procedures / Treatments   Labs (all labs ordered are listed, but only abnormal results are displayed) Labs Reviewed  BASIC METABOLIC PANEL WITH GFR - Abnormal; Notable for the following components:      Result Value   Creatinine, Ser 1.16 (*)    GFR, Estimated 55 (*)    All other components within normal limits  CBC WITH DIFFERENTIAL/PLATELET  TSH  T4, FREE  T3  TROPONIN I (HIGH SENSITIVITY)  TROPONIN I (HIGH SENSITIVITY)    EKG None  Radiology DG Chest Port 1 View Result Date: 12/14/2023 CLINICAL DATA:  Hypertension. EXAM: PORTABLE CHEST 1 VIEW  COMPARISON:  Chest CT dated 10/09/2022. FINDINGS: Mild cardiomegaly with mild central vascular congestion. No focal consolidation, pleural effusion, pneumothorax. No acute osseous pathology IMPRESSION: Mild cardiomegaly with mild central vascular congestion. Electronically Signed   By: Elgie Collard M.D.   On: 12/14/2023 15:54    Procedures Procedures    Medications Ordered in ED Medications  sodium chloride 0.9 % bolus 1,000 mL (1,000 mLs Intravenous New Bag/Given 12/14/23 1623)  ED Course/ Medical Decision Making/ A&P                                 Medical Decision Making Amount and/or Complexity of Data Reviewed Labs: ordered. Radiology: ordered.   This patient presents to the ED for concern of hypotension, this involves an extensive number of treatment options, and is a complaint that carries with it a high risk of complications and morbidity.  The differential diagnosis includes hypothyroidism, STEMI, NSTEMI, dehydration, pericardial effusion   Co morbidities that complicate the patient evaluation  Hypertension, hypothyroidism, fibromyalgia,   Additional history obtained:  Additional history obtained from husband External records from outside source obtained and reviewed including  Family medicine progress notes Most recent echo in January 2025 which showed EF of 55 to 60%. Patient's blood pressure improved to 121/79 after fluid bolus. Discussed with patient her water intake, patient endorses drinking a lot of coffee and lemonade.  Educated patient that coffee is high in caffeine which is a diuretic and could be contributing to her hypotension and she should try to drink more water.   Lab Tests:  I Ordered, and personally interpreted labs.  The pertinent results include:   Mildly elevated creatinine which is chronic per historical values TSH, free T4, WNL Orthostatic Lying BP- Lying: 130/66 Pulse- Lying: 62 Orthostatic Sitting BP- Sitting: 131/72 Pulse-  Sitting: 56 Orthostatic Standing at 0 minutes BP- Standing at 0 minutes: 124/69 Pulse- Standing at 0 minutes: 75 Orthostatic Standing at 3 minutes BP- Standing at 3 minutes: 112/77 Pulse- Standing at 3 minutes: 62    Imaging Studies ordered:  I ordered imaging studies including chest x-ray I independently visualized and interpreted imaging which showed mild cardiomegaly with mild central vascular congestion I agree with the radiologist interpretation   Cardiac Monitoring: / EKG:  The patient was maintained on a cardiac monitor.  I personally viewed and interpreted the cardiac monitored which showed an underlying rhythm of: Sinus rhythm    Problem List / ED Course / Critical interventions / Medication management  I ordered medication including 1L NS  for hypotension  Reevaluation of the patient after these medicines showed that the patient improved I have reviewed the patients home medicines and have made adjustments as needed   Test / Admission - Considered:  Considered for admission or further workup however patient's vital signs, physical exam, labs, and imaging are reassuring.  Patient still pending T3.  Patient's blood pressure has improved to normal after 1 L normal saline and patient is orthostatic negative.  Patient states that she is feeling much better and safe for discharge.  Patient to be discharged with close follow-up with PCP for further evaluation and treatment.        Final Clinical Impression(s) / ED Diagnoses Final diagnoses:  Hypotension, unspecified hypotension type    Rx / DC Orders ED Discharge Orders     None         Gretta Began 12/14/23 2027    Tegeler, Canary Brim, MD 12/15/23 2211

## 2023-12-14 NOTE — ED Notes (Signed)
 Pt endorses dizziness and not feeling well this  morning.   Pt says her BP drops usually and correct itself. This time it hasn't came back up. Pt isn't on any BP medication.  Pt was taking a fluid pill but the MD stopped it d/t low BP in past.   Pt medical team currently unsure what is causing low BP.

## 2023-12-14 NOTE — Discharge Instructions (Signed)
 Today you are seen for hypertension.  Please try to maintain adequate hydration and avoid consuming too much caffeine or sugary drinks.  Please follow-up with your PCP as soon as possible for further evaluation and treatment.  Thank you for letting us treat you today. After reviewing your labs and imaging, I feel you are safe to go home. Please follow up with your PCP in the next several days and provide them with your records from this visit. Return to the Emergency Room if pain becomes severe or symptoms worsen.

## 2023-12-14 NOTE — ED Triage Notes (Signed)
 Pt bib ems from home c/o fluctuating BP.  SBP 70's at home. Pt received 600 cc normal saline. Pt BP 119 but lowered to the 80's.    BP 80/60 HR 54 CBG 106 RA 98%  18g left ac

## 2023-12-16 LAB — T3: T3, Total: 110 ng/dL (ref 71–180)

## 2023-12-21 DIAGNOSIS — R031 Nonspecific low blood-pressure reading: Secondary | ICD-10-CM | POA: Diagnosis not present

## 2023-12-21 DIAGNOSIS — J309 Allergic rhinitis, unspecified: Secondary | ICD-10-CM | POA: Diagnosis not present

## 2023-12-21 DIAGNOSIS — Z09 Encounter for follow-up examination after completed treatment for conditions other than malignant neoplasm: Secondary | ICD-10-CM | POA: Diagnosis not present

## 2023-12-22 DIAGNOSIS — J309 Allergic rhinitis, unspecified: Secondary | ICD-10-CM | POA: Diagnosis not present

## 2023-12-24 DIAGNOSIS — Z6838 Body mass index (BMI) 38.0-38.9, adult: Secondary | ICD-10-CM | POA: Diagnosis not present

## 2023-12-24 DIAGNOSIS — R635 Abnormal weight gain: Secondary | ICD-10-CM | POA: Diagnosis not present

## 2023-12-28 ENCOUNTER — Ambulatory Visit (INDEPENDENT_AMBULATORY_CARE_PROVIDER_SITE_OTHER): Payer: PPO | Admitting: Obstetrics

## 2023-12-28 ENCOUNTER — Encounter: Payer: Self-pay | Admitting: Obstetrics

## 2023-12-28 VITALS — BP 108/52 | HR 77

## 2023-12-28 DIAGNOSIS — N3281 Overactive bladder: Secondary | ICD-10-CM | POA: Diagnosis not present

## 2023-12-28 NOTE — Assessment & Plan Note (Signed)
-   reports resolution of urinary leakage for 1 month after botox injection and urethral bulking on 11/26/23 - POCT UA + protein with Cr 1.11 on 02/12/21. Repeat Cr 1.16 on 12/14/23 - encouraged caffeine reduction due to recent exacerbation of urgency urinary leakage, timed voids, and bladder training - if refractory symptoms, resume mirabegron daily. - We discussed the symptoms of overactive bladder (OAB), which include urinary urgency, urinary frequency, nocturia, with or without urge incontinence.  While we do not know the exact etiology of OAB, several treatment options exist. We discussed management including behavioral therapy (decreasing bladder irritants, urge suppression strategies, timed voids, bladder retraining), physical therapy, medication; for refractory cases posterior tibial nerve stimulation, sacral neuromodulation, and intravesical botulinum toxin injection.  For anticholinergic medications, we discussed the potential side effects of anticholinergics including dry eyes, dry mouth, constipation, cognitive impairment and urinary retention. For Beta-3 agonist medication, we discussed the potential side effect of elevated blood pressure which is more likely to occur in individuals with uncontrolled hypertension. - minimal relief with Leslye Peer  - For refractory OAB we reviewed the procedure for intravesical Botox injection with cystoscopy in the office and reviewed the risks, benefits and alternatives of treatment including but not limited to infection, need for self-catheterization and need for repeat therapy.  We discussed that there is a 5-15% chance of needing to catheterize with Botox and that this usually resolves in a few months; however can persist for longer periods of time.  Typically Botox injections would need to be repeated every 3-12 months since this is not a permanent therapy.   We discussed the role of sacral neuromodulation and how it works. It requires a test phase, and  documentation of bladder function via diary. After a successful test period, a permanent wire and generator are placed in the OR. The battery lasts 5 years on average and would need to be replaced surgically.  The goal of this therapy is at least a 50% improvement in symptoms. It is NOT realistic to expect a 100% cure.  We reviewed the fact that about 30% of patients fail the test phase and are not candidates for permanent generator placement.  We discussed the risk of infection and that the patient would not be able to get an MRI once the device is placed. There are two companies that provide this therapy: [employer] and [employer]. [employer]' product is new and is similar to [employer]'s, but has advantages of a smaller and rechargeable battery and being able to have an MRI with the implant. For all procedures, we discussed risks of bleeding, infection, damage to surrounding organs including bowel, bladder, blood vessels, ureters and nerves, need for further surgery, risk of postoperative urinary incontinence or retention with need to catheterize, recurrent prolapse, numbness and weakness at any body site, buttock pain, and the rarer risks of blood clot, heart attack, pneumonia, death.    We also discussed the role of percutaneous tibial nerve stimulation and how it works.  She understands it requires 12 weekly visits for temporary neuromodulation of the sacral nerve roots via the tibial nerve and that she may then require continued tapered treatment.  She will return for the procedure. All questions were answered.  - UDS 10/06/23 DOI with movement, normal PVR and sustained detrusor contraction during pressure flow.

## 2023-12-28 NOTE — Patient Instructions (Addendum)
 Decrease your caffeine intake to your baseline and limit to 200mg  caffeine/day.   If no improvement in urinary symptoms, resume mirabegron daily.

## 2023-12-28 NOTE — Progress Notes (Signed)
 [place] [facility] Return Visit  SUBJECTIVE  History of Present Illness: Kelly Hansen is a 58 y.o. female seen in follow-up for botox injection and urethral bulking on 11/26/23.    Reports resolution of urinary symptoms since botox injection and urethral bulking.  Urgency urinary leakage started 2 days ago. UUI 2-3x/day Reports increased coffee intake in the past week, 1 pot of coffee when she was at her sister's house.  Voids 10x/day, 1x/night Denies UTI symptoms, dysuria, hematuria, increased frequency/urgency.  Denies changes in bowel movements.  Stopped medication around 3 weeks ago Denies SUI since procedure  Past Medical History: Patient  has a past medical history of Acid reflux, Angina pectoris (HCC) (02/12/2021), Arthritis of multiple sites, Arthropathy of lumbar facet joint (06/17/2022), Asthma, Atherosclerosis of aorta (HCC), Chest pain of uncertain etiology (04/11/2021), CIN III with severe dysplasia (10/06/2016), Degeneration of lumbar intervertebral disc (03/30/2020), Depression, Fibromyalgia, GERD (gastroesophageal reflux disease), Hyperlipidemia, Hypothyroid, Increased body mass index (03/30/2020), Insomnia, Lumbar radiculopathy (06/24/2022), Lumbar spondylosis (06/17/2022), Mild CAD (04/11/2021), Morbid obesity (HCC) (02/12/2021), Peripheral neuropathy, Thyroid disease, Umbilical hernia, and Vitamin D deficiency.   Past Surgical History: She  has a past surgical history that includes Abdominal hysterectomy; Appendectomy; LEFT HEART CATH AND CORONARY ANGIOGRAPHY (N/A, 02/13/2021); and Biopsy thyroid (08/26/2023).   Medications: She has a current medication list which includes the following prescription(s): albuterol, alprazolam, donepezil, duloxetine, ergocalciferol, famotidine, trelegy ellipta, furosemide, levothyroxine, meclizine, meloxicam, memantine, mirabegron er, montelukast, nitroglycerin, olopatadine, pantoprazole, potassium chloride, pramipexole, pregabalin,  rosuvastatin, tizanidine, and trazodone.   Allergies: Patient is allergic to penicillins and sulfa antibiotics.   Social History: Patient  reports that she quit smoking about 12 years ago. Her smoking use included cigarettes. She has never used smokeless tobacco. She reports that she does not drink alcohol and does not use drugs.     OBJECTIVE     Physical Exam: Vitals:   12/28/23 1457  BP: (!) 108/52  Pulse: 77   Gen: No apparent distress, A&O x 3.  Detailed Urogynecologic Evaluation:  Deferred.   Lab Results  Component Value Date   CREATININE 1.16 (H) 12/14/2023    Post Void Residual - 12/28/23 1529       Post Void Residual   Post Void Residual 67 mL               ASSESSMENT AND PLAN    Kelly Hansen is a 58 y.o. with:  1. OAB (overactive bladder)     OAB (overactive bladder) Assessment & Plan: - reports resolution of urinary leakage for 1 month after botox injection and urethral bulking on 11/26/23 - POCT UA + protein with Cr 1.11 on 02/12/21. Repeat Cr 1.16 on 12/14/23 - encouraged caffeine reduction due to recent exacerbation of urgency urinary leakage, timed voids, and bladder training - if refractory symptoms, resume mirabegron daily. - We discussed the symptoms of overactive bladder (OAB), which include urinary urgency, urinary frequency, nocturia, with or without urge incontinence.  While we do not know the exact etiology of OAB, several treatment options exist. We discussed management including behavioral therapy (decreasing bladder irritants, urge suppression strategies, timed voids, bladder retraining), physical therapy, medication; for refractory cases posterior tibial nerve stimulation, sacral neuromodulation, and intravesical botulinum toxin injection.  For anticholinergic medications, we discussed the potential side effects of anticholinergics including dry eyes, dry mouth, constipation, cognitive impairment and urinary retention. For Beta-3 agonist  medication, we discussed the potential side effect of elevated blood pressure which is more likely to occur in  individuals with uncontrolled hypertension. - minimal relief with Gemtesa  - For refractory OAB we reviewed the procedure for intravesical Botox injection with cystoscopy in the office and reviewed the risks, benefits and alternatives of treatment including but not limited to infection, need for self-catheterization and need for repeat therapy.  We discussed that there is a 5-15% chance of needing to catheterize with Botox and that this usually resolves in a few months; however can persist for longer periods of time.  Typically Botox injections would need to be repeated every 3-12 months since this is not a permanent therapy.   We discussed the role of sacral neuromodulation and how it works. It requires a test phase, and documentation of bladder function via diary. After a successful test period, a permanent wire and generator are placed in the OR. The battery lasts 5 years on average and would need to be replaced surgically.  The goal of this therapy is at least a 50% improvement in symptoms. It is NOT realistic to expect a 100% cure.  We reviewed the fact that about 30% of patients fail the test phase and are not candidates for permanent generator placement.  We discussed the risk of infection and that the patient would not be able to get an MRI once the device is placed. There are two companies that provide this therapy: [employer] and [employer]. [employer]' product is new and is similar to [employer]'s, but has advantages of a smaller and rechargeable battery and being able to have an MRI with the implant. For all procedures, we discussed risks of bleeding, infection, damage to surrounding organs including bowel, bladder, blood vessels, ureters and nerves, need for further surgery, risk of postoperative urinary incontinence or retention with need to catheterize, recurrent prolapse, numbness and weakness  at any body site, buttock pain, and the rarer risks of blood clot, heart attack, pneumonia, death.    We also discussed the role of percutaneous tibial nerve stimulation and how it works.  She understands it requires 12 weekly visits for temporary neuromodulation of the sacral nerve roots via the tibial nerve and that she may then require continued tapered treatment.  She will return for the procedure. All questions were answered.  - UDS 10/06/23 DOI with movement, normal PVR and sustained detrusor contraction during pressure flow.    Time spent: I spent 16 minutes dedicated to the care of this patient on the date of this encounter to include pre-visit review of records, face-to-face time with the patient discussing overactive bladder and post visit documentation.   Darlene Ehlers, MD

## 2023-12-30 ENCOUNTER — Other Ambulatory Visit (HOSPITAL_COMMUNITY)
Admission: RE | Admit: 2023-12-30 | Discharge: 2023-12-30 | Disposition: A | Discharge status: Home / Self Care | Source: Ambulatory Visit | Attending: Nurse Practitioner | Admitting: Nurse Practitioner

## 2023-12-30 ENCOUNTER — Ambulatory Visit
Admission: RE | Admit: 2023-12-30 | Discharge: 2023-12-30 | Disposition: A | Discharge status: Home / Self Care | Payer: Self-pay | Source: Ambulatory Visit | Attending: Nurse Practitioner | Admitting: Nurse Practitioner

## 2023-12-30 DIAGNOSIS — E079 Disorder of thyroid, unspecified: Secondary | ICD-10-CM

## 2023-12-30 DIAGNOSIS — E042 Nontoxic multinodular goiter: Secondary | ICD-10-CM | POA: Diagnosis not present

## 2023-12-30 DIAGNOSIS — E041 Nontoxic single thyroid nodule: Secondary | ICD-10-CM | POA: Diagnosis not present

## 2024-01-01 LAB — CYTOLOGY - NON PAP

## 2024-01-04 DIAGNOSIS — M7989 Other specified soft tissue disorders: Secondary | ICD-10-CM | POA: Diagnosis not present

## 2024-01-04 DIAGNOSIS — J309 Allergic rhinitis, unspecified: Secondary | ICD-10-CM | POA: Diagnosis not present

## 2024-01-04 DIAGNOSIS — E041 Nontoxic single thyroid nodule: Secondary | ICD-10-CM | POA: Diagnosis not present

## 2024-01-04 DIAGNOSIS — M545 Low back pain, unspecified: Secondary | ICD-10-CM | POA: Diagnosis not present

## 2024-01-04 DIAGNOSIS — G629 Polyneuropathy, unspecified: Secondary | ICD-10-CM | POA: Diagnosis not present

## 2024-01-10 ENCOUNTER — Other Ambulatory Visit: Payer: Self-pay | Admitting: Obstetrics and Gynecology

## 2024-01-10 DIAGNOSIS — N3281 Overactive bladder: Secondary | ICD-10-CM

## 2024-01-21 DIAGNOSIS — Z6838 Body mass index (BMI) 38.0-38.9, adult: Secondary | ICD-10-CM | POA: Diagnosis not present

## 2024-01-21 DIAGNOSIS — R635 Abnormal weight gain: Secondary | ICD-10-CM | POA: Diagnosis not present

## 2024-02-10 DIAGNOSIS — Z6838 Body mass index (BMI) 38.0-38.9, adult: Secondary | ICD-10-CM | POA: Diagnosis not present

## 2024-02-10 DIAGNOSIS — E66812 Obesity, class 2: Secondary | ICD-10-CM | POA: Diagnosis not present

## 2024-02-10 DIAGNOSIS — R059 Cough, unspecified: Secondary | ICD-10-CM | POA: Diagnosis not present

## 2024-02-10 DIAGNOSIS — J988 Other specified respiratory disorders: Secondary | ICD-10-CM | POA: Diagnosis not present

## 2024-02-17 ENCOUNTER — Ambulatory Visit

## 2024-02-17 VITALS — BP 100/64 | HR 60 | Ht 62.0 in | Wt 207.0 lb

## 2024-02-17 DIAGNOSIS — I2510 Atherosclerotic heart disease of native coronary artery without angina pectoris: Secondary | ICD-10-CM | POA: Diagnosis not present

## 2024-02-17 DIAGNOSIS — E782 Mixed hyperlipidemia: Secondary | ICD-10-CM | POA: Diagnosis not present

## 2024-02-17 NOTE — Assessment & Plan Note (Signed)
 The mild nonobstructive CAD on cath June 2022. Asymptomatic. Easy bruisability and not on aspirin .  Advised about dietary and lifestyle modifications to target weight loss. Recommended to exercise regularly 6-7 times a week. She is motivated and is planning on getting to the gym regularly. Continue with lipid-lowering therapy as below.

## 2024-02-17 NOTE — Assessment & Plan Note (Signed)
 Last lipid panel from April 2024 showed optimal readings with LDL 68 and HDL 42.  Continue with rosuvastatin  10 mg once daily. Continue follow-up with PCP.

## 2024-02-17 NOTE — Patient Instructions (Signed)
Medication Instructions:  Your physician recommends that you continue on your current medications as directed. Please refer to the Current Medication list given to you today.  *If you need a refill on your cardiac medications before your next appointment, please call your pharmacy*   Lab Work: None ordered If you have labs (blood work) drawn today and your tests are completely normal, you will receive your results only by: MyChart Message (if you have MyChart) OR A paper copy in the mail If you have any lab test that is abnormal or we need to change your treatment, we will call you to review the results.   Testing/Procedures: None ordered   Follow-Up: At [facility], you and your health needs are our priority.  As part of our continuing mission to provide you with exceptional heart care, we have created designated Provider Care Teams.  These Care Teams include your primary Cardiologist (physician) and Advanced Practice Providers (APPs -  Physician Assistants and Nurse Practitioners) who all work together to provide you with the care you need, when you need it.  We recommend signing up for the patient portal called "MyChart".  Sign up information is provided on this After Visit Summary.  MyChart is used to connect with patients for Virtual Visits (Telemedicine).  Patients are able to view lab/test results, encounter notes, upcoming appointments, etc.  Non-urgent messages can be sent to your provider as well.   To learn more about what you can do with MyChart, go to [email or web address].    Your next appointment:   12 month(s)  The format for your next appointment:   In Person  Provider:   Huntley Dec, MD    Other Instructions none  Important Information About Sugar

## 2024-02-17 NOTE — Progress Notes (Signed)
 Cardiology Consultation:    Date:  02/17/2024   ID:  Clearance Cure, DOB 03/18/1966, MRN [id number]  PCP:  Tamela Fake, MD  Cardiologist:  Daymon Evans Darl Brisbin, MD   Referring MD: Tamela Fake, MD   No chief complaint on file.    ASSESSMENT AND PLAN:   [name] 58 year old woman with history of mild nonobstructive coronary artery disease on cath June 2022, morbid obesity, dyslipidemia, GERD, hypothyroidism, intermittent symptomatic hypotension, left thyroid  mass no significant abnormalities on biopsies and pending further follow-ups, echocardiogram from January 2025 noted normal biventricular function LVEF 55 to 60% grade 1 diastolic dysfunction, no significant valve abnormalities.  Had a visit to ER Problem List Items Addressed This Visit     Mild CAD - Primary   The mild nonobstructive CAD on cath June 2022. Asymptomatic. Easy bruisability and not on aspirin .  Advised about dietary and lifestyle modifications to target weight loss. Recommended to exercise regularly 6-7 times a week. She is motivated and is planning on getting to the gym regularly. Continue with lipid-lowering therapy as below.       Hyperlipidemia   Last lipid panel from April 2024 showed optimal readings with LDL 68 and HDL 42.  Continue with rosuvastatin  10 mg once daily. Continue follow-up with PCP.       See us  in the office tentatively in 1 year or as needed.   History of Present Illness:    Kelly Hansen is a 58 y.o. female who is being seen today for follow-up visit. PCP is Tamela Fake, MD. Last visit with me in the office was 08-20-2023.  Has history of mild nonobstructive coronary artery disease on cath June 2022, morbid obesity, dyslipidemia, GERD, hypothyroidism, intermittent symptomatic hypotension, left thyroid  mass, echocardiogram from January 2025 noted normal biventricular function LVEF 55 to 60% grade 1 diastolic dysfunction, no significant valve abnormalities.   On  12-14-2023 she had a visit to the ER for low blood pressures associated with lightheadedness, blood pressures in the ER were unremarkable and was discharged after IV fluids.  Today here for the visit by herself mentions she continues to feel occasional incidences of lightheadedness and low blood pressures on standing changing in position.  Denies any falls, denies any syncopal episodes. Denies any pedal edema. Denies any chest pain or shortness of breath suggestive of exertion. She does report atypical chest discomfort which occurs at times randomly and short lasting. Denies any palpitations.  Good compliance with her medications. Continues to bruise easily despite being off aspirin .   Past Medical History:  Diagnosis Date   Acid reflux    Angina pectoris (HCC) 02/12/2021   Arthritis of multiple sites    Arthropathy of lumbar facet joint 06/17/2022   Asthma    Atherosclerosis of aorta (HCC)    Chest pain of uncertain etiology 04/11/2021   Chronic insomnia 10/19/2023   CIN III with severe dysplasia 10/06/2016   Formatting of this note might be different from the original. Reports [facility] between 1991-2004 for abnormal Pap followed by Pap q89mos then q31mos She then had TVH in 2004 with Dr. Rosamond Comes Needs Pap smears for 20-25y post-hysterectomy due to presumed h/o CIN 2-3   Class 3 severe obesity due to excess calories with serious comorbidity and body mass index (BMI) of 40.0 to 44.9 in adult 10/19/2023   Degeneration of lumbar intervertebral disc 03/30/2020   Depression    Excessive daytime sleepiness 10/19/2023   Fibromyalgia    GERD (gastroesophageal reflux disease)  Hyperlipidemia    Hypotension 08/20/2023   Hypothyroid    Incontinence of feces with fecal urgency 09/04/2023   Increased body mass index 03/30/2020   Insomnia    Lumbar radiculopathy 06/24/2022   Lumbar spondylosis 06/17/2022   Mild CAD 04/11/2021   Morbid obesity (HCC) 02/12/2021   Nocturia more than twice per night  09/04/2023   OAB (overactive bladder) 09/04/2023   Orthopnea 10/19/2023   Pelvic floor dysfunction in female 09/04/2023   Peripheral neuropathy    Sleep apnea 10/19/2023   SUI (stress urinary incontinence, female) 09/04/2023   Thyroid  disease    Umbilical hernia    Vitamin D  deficiency     Past Surgical History:  Procedure Laterality Date   ABDOMINAL HYSTERECTOMY     APPENDECTOMY     BIOPSY THYROID   08/26/2023   LEFT HEART CATH AND CORONARY ANGIOGRAPHY N/A 02/13/2021   Procedure: LEFT HEART CATH AND CORONARY ANGIOGRAPHY;  Surgeon: Arnoldo Lapping, MD;  Location: [facility] INVASIVE CV LAB;  Service: Cardiovascular;  Laterality: N/A;    Current Medications: Current Meds  Medication Sig   albuterol  (VENTOLIN  HFA) 108 (90 Base) MCG/ACT inhaler Inhale 2 puffs into the lungs 3 (three) times daily as needed for wheezing or shortness of breath.   donepezil  (ARICEPT ) 10 MG tablet Take 10 mg by mouth at bedtime.   DULoxetine (CYMBALTA) 60 MG capsule Take 60 mg by mouth in the morning.   ergocalciferol  (VITAMIN D2) 1.25 MG (50000 UT) capsule Take 1 capsule by mouth once a week.   Fluticasone -Umeclidin-Vilant (TRELEGY ELLIPTA) 100-62.5-25 MCG/ACT AEPB Inhale 1 puff into the lungs daily.   GEMTESA  75 MG TABS Take 1 tablet by mouth daily.   levothyroxine (SYNTHROID) 25 MCG tablet Take 25 mcg by mouth daily before breakfast.   meclizine  (ANTIVERT ) 12.5 MG tablet Take 12.5 mg by mouth 2 (two) times daily as needed for dizziness.   meloxicam (MOBIC) 7.5 MG tablet Take 7.5 mg by mouth daily.   memantine (NAMENDA) 10 MG tablet Take 10 mg by mouth 2 (two) times daily.   montelukast  (SINGULAIR ) 10 MG tablet Take 1 tablet by mouth at bedtime.   nitroGLYCERIN  (NITROSTAT ) 0.4 MG SL tablet Take 1 (one) Tablet as needed for chest pain-If not better in 5 minutes repeat x 2--If still not better--seek attention at ER/ Call 911   olopatadine  (PATANOL) 0.1 % ophthalmic solution Place 1 drop into each eye as directed    pantoprazole (PROTONIX) 40 MG tablet Take 40 mg by mouth in the morning.   pramipexole (MIRAPEX) 0.125 MG tablet Take 0.125 mg by mouth 2 (two) times daily.   pregabalin  (LYRICA ) 100 MG capsule Take 100 mg by mouth 3 (three) times daily.   rosuvastatin  (CRESTOR ) 10 MG tablet Take 10 mg by mouth at bedtime.   tiZANidine (ZANAFLEX) 4 MG tablet Take 4 mg by mouth 2 (two) times daily.   traZODone (DESYREL) 50 MG tablet Take 50 mg by mouth at bedtime as needed for sleep.     Allergies:   Penicillins and Sulfa antibiotics   Social History   Socioeconomic History   Marital status: Married    Spouse name: Ryelynn Guedea   Number of children: Not on file   Years of education: Not on file   Highest education level: Not on file  Occupational History   Not on file  Tobacco Use   Smoking status: Former    Current packs/day: 0.00    Types: Cigarettes    Quit date: 03/10/2011  Years since quitting: 12.9   Smokeless tobacco: Never  Vaping Use   Vaping status: Never Used  Substance and Sexual Activity   Alcohol use: No   Drug use: No   Sexual activity: Yes  Other Topics Concern   Not on file  Social History Narrative   Right handed   Lives with family    One story home few steps to get into home    Social Drivers of Health   Financial Resource Strain: Not on file  Food Insecurity: Not on file  Transportation Needs: Not on file  Physical Activity: Not on file  Stress: Not on file  Social Connections: Not on file     Family History: The patient's family history includes Asthma in her mother and sister; Cirrhosis in her brother; Colon cancer in her father; Dementia in her maternal aunt, maternal uncle, mother, and sister; Diabetes in her brother and mother; Heart disease in her brother and mother; Hyperlipidemia in her brother and mother; Hypertension in her brother and mother; Kidney disease in her brother and mother; Melanoma in her mother; Ovarian cancer in her paternal aunt;  Rectal cancer in her father; Skin cancer in her father; Stroke in her mother; Thyroid  cancer in her niece. There is no history of Bladder Cancer, Uterine cancer, or Kidney cancer. ROS:   Please see the history of present illness.    All 14 point review of systems negative except as described per history of present illness.  EKGs/Labs/Other Studies Reviewed:    The following studies were reviewed today:   EKG:       Recent Labs: 12/14/2023: BUN 14; Creatinine, Ser 1.16; Hemoglobin 13.7; Platelets 204; Potassium 4.3; Sodium 142; TSH 3.485  Recent Lipid Panel No results found for: "CHOL", "TRIG", "HDL", "CHOLHDL", "VLDL", "LDLCALC", "LDLDIRECT"  Physical Exam:    VS:  BP 100/64   Pulse 60   Ht 5\' 2"  (1.575 m)   Wt 207 lb (93.9 kg)   SpO2 96%   BMI 37.86 kg/m     Wt Readings from Last 3 Encounters:  02/17/24 207 lb (93.9 kg)  12/14/23 217 lb (98.4 kg)  10/19/23 235 lb (106.6 kg)     GENERAL:  Well nourished, well developed in no acute distress NECK: No JVD; No carotid bruits CARDIAC: RRR, S1 and S2 present, no murmurs, no rubs, no gallops CHEST:  Clear to auscultation without rales, wheezing or rhonchi  Extremities: No pitting pedal edema. Pulses bilaterally symmetric with radial 2+ and dorsalis pedis 2+ NEUROLOGIC:  Alert and oriented x 3  Medication Adjustments/Labs and Tests Ordered: Current medicines are reviewed at length with the patient today.  Concerns regarding medicines are outlined above.  No orders of the defined types were placed in this encounter.  No orders of the defined types were placed in this encounter.   Signed, Lura Sallies, MD, MPH, [facility]. 02/17/2024 11:57 AM    [facility]

## 2024-02-26 ENCOUNTER — Ambulatory Visit: Admitting: Obstetrics

## 2024-03-15 ENCOUNTER — Other Ambulatory Visit: Payer: Self-pay | Admitting: Obstetrics and Gynecology

## 2024-03-15 DIAGNOSIS — Z6837 Body mass index (BMI) 37.0-37.9, adult: Secondary | ICD-10-CM | POA: Diagnosis not present

## 2024-03-15 DIAGNOSIS — E66812 Obesity, class 2: Secondary | ICD-10-CM | POA: Diagnosis not present

## 2024-03-15 DIAGNOSIS — R062 Wheezing: Secondary | ICD-10-CM | POA: Diagnosis not present

## 2024-03-15 DIAGNOSIS — W5501XA Bitten by cat, initial encounter: Secondary | ICD-10-CM | POA: Diagnosis not present

## 2024-03-15 DIAGNOSIS — J988 Other specified respiratory disorders: Secondary | ICD-10-CM | POA: Diagnosis not present

## 2024-03-15 NOTE — Telephone Encounter (Signed)
 Sorry, another provider had prescribed patient Gemtesa . Do we approve for refills on does the prescribing provider?

## 2024-03-15 NOTE — Telephone Encounter (Signed)
 Another provide wrote has patient on Gemtesa . Last time seen with us  she was on Mirabegron . Please advise on medication.

## 2024-03-16 DIAGNOSIS — M545 Low back pain, unspecified: Secondary | ICD-10-CM | POA: Diagnosis not present

## 2024-03-16 DIAGNOSIS — R1310 Dysphagia, unspecified: Secondary | ICD-10-CM | POA: Diagnosis not present

## 2024-03-16 DIAGNOSIS — W5501XA Bitten by cat, initial encounter: Secondary | ICD-10-CM | POA: Diagnosis not present

## 2024-03-16 DIAGNOSIS — E559 Vitamin D deficiency, unspecified: Secondary | ICD-10-CM | POA: Diagnosis not present

## 2024-03-16 DIAGNOSIS — K219 Gastro-esophageal reflux disease without esophagitis: Secondary | ICD-10-CM | POA: Diagnosis not present

## 2024-03-16 DIAGNOSIS — N3281 Overactive bladder: Secondary | ICD-10-CM | POA: Diagnosis not present

## 2024-03-22 DIAGNOSIS — N3281 Overactive bladder: Secondary | ICD-10-CM | POA: Diagnosis not present

## 2024-03-22 DIAGNOSIS — Z6837 Body mass index (BMI) 37.0-37.9, adult: Secondary | ICD-10-CM | POA: Diagnosis not present

## 2024-03-22 DIAGNOSIS — W5501XA Bitten by cat, initial encounter: Secondary | ICD-10-CM | POA: Diagnosis not present

## 2024-03-22 DIAGNOSIS — E559 Vitamin D deficiency, unspecified: Secondary | ICD-10-CM | POA: Diagnosis not present

## 2024-03-22 DIAGNOSIS — K219 Gastro-esophageal reflux disease without esophagitis: Secondary | ICD-10-CM | POA: Diagnosis not present

## 2024-03-22 DIAGNOSIS — E785 Hyperlipidemia, unspecified: Secondary | ICD-10-CM | POA: Diagnosis not present

## 2024-03-22 DIAGNOSIS — Z79899 Other long term (current) drug therapy: Secondary | ICD-10-CM | POA: Diagnosis not present

## 2024-03-24 ENCOUNTER — Ambulatory Visit: Admitting: Obstetrics

## 2024-03-24 DIAGNOSIS — S81811A Laceration without foreign body, right lower leg, initial encounter: Secondary | ICD-10-CM | POA: Diagnosis not present

## 2024-03-31 DIAGNOSIS — S81811A Laceration without foreign body, right lower leg, initial encounter: Secondary | ICD-10-CM | POA: Diagnosis not present

## 2024-04-13 ENCOUNTER — Ambulatory Visit: Admitting: Obstetrics

## 2024-04-18 DIAGNOSIS — E041 Nontoxic single thyroid nodule: Secondary | ICD-10-CM | POA: Diagnosis not present

## 2024-04-18 DIAGNOSIS — N3281 Overactive bladder: Secondary | ICD-10-CM | POA: Diagnosis not present

## 2024-04-18 DIAGNOSIS — M7989 Other specified soft tissue disorders: Secondary | ICD-10-CM | POA: Diagnosis not present

## 2024-04-18 DIAGNOSIS — J309 Allergic rhinitis, unspecified: Secondary | ICD-10-CM | POA: Diagnosis not present

## 2024-04-18 DIAGNOSIS — G629 Polyneuropathy, unspecified: Secondary | ICD-10-CM | POA: Diagnosis not present

## 2024-04-18 DIAGNOSIS — M797 Fibromyalgia: Secondary | ICD-10-CM | POA: Diagnosis not present

## 2024-04-26 ENCOUNTER — Other Ambulatory Visit: Payer: Self-pay

## 2024-04-26 ENCOUNTER — Other Ambulatory Visit (HOSPITAL_COMMUNITY): Payer: Self-pay

## 2024-05-04 ENCOUNTER — Encounter: Payer: Self-pay | Admitting: Gastroenterology

## 2024-05-04 ENCOUNTER — Ambulatory Visit: Admitting: "Endocrinology

## 2024-05-04 ENCOUNTER — Encounter: Payer: Self-pay | Admitting: "Endocrinology

## 2024-05-04 VITALS — BP 120/80 | HR 84 | Ht 62.0 in | Wt 199.0 lb

## 2024-05-04 DIAGNOSIS — E042 Nontoxic multinodular goiter: Secondary | ICD-10-CM

## 2024-05-04 DIAGNOSIS — E039 Hypothyroidism, unspecified: Secondary | ICD-10-CM | POA: Diagnosis not present

## 2024-05-04 LAB — TSH+FREE T4: TSH W/REFLEX TO FT4: 2.69 m[IU]/L (ref 0.40–4.50)

## 2024-05-04 NOTE — Progress Notes (Signed)
 Outpatient Endocrinology Note Kelly Birmingham, MD  05/04/24   KAIRA STRINGFIELD [birth year] [id number]  Referring Provider: Gable Cambric, MD Primary Care Provider: Gable Cambric, MD Subjective  No chief complaint on file.   Assessment & Plan  Diagnoses and all orders for this visit:  Acquired hypothyroidism -     TSH + free T4 -     TSH + free T4  Multiple thyroid  nodules -     TSH + free T4 -     TSH + free T4    ANASTASHIA WESTERFELD is currently taking  levothyroxine 25 mcg every day. Patient is currently biochemically euthyroid.  Educated on thyroid  axis.  Recommend the following: Take levothyroxine 25mcg every morning.  Labs today.  If lab normal, discussed that we will trial off levothyroxine ordered for 3 months to reassess if patient truly needs it or not.  Patient has had no benefit in terms of her fatigue/any other symptoms since being on this medication and is willing to follow-up called to trial. Advised to take levothyroxine first thing in the morning on empty stomach and wait at least 30 minutes to 1 hour before eating or drinking anything or taking any other medications. Space out levothyroxine by 4 hours from any acid reflux medication/fibrate/iron/calcium /multivitamin. Advised to take birth control pills and nutritional supplements in the evening. Repeat lab before next visit or sooner if symptoms of hyperthyroidism or hypothyroidism develop.  Notify us  immediately in case of pregnancy/breastfeeding or significant weight gain or loss. Counseled on compliance and follow up needs.  No thyroid  ultrasound found in the system 12/30/23 Specimen Submitted:  A. THYROID , LT MID, FINE NEEDLE ASPIRATION  Repeat BX of nodule #2: Maximum size: 3.0 x 2.3 x 2.0 cm. Location: Left; Midpole.  FINAL MICROSCOPIC DIAGNOSIS:  - Benign follicular nodule (Bethesda category II)   12/30/23 Specimen Submitted:  A. THYROID , LT INFERIOR, FINE NEEDLE ASPIRATION  Repeat BX of Nodule #: 3  Maximum size: 2.2 x 1.7 x 1.7 cm. Location: Left; inferior pole.  FINAL MICROSCOPIC DIAGNOSIS:  - Benign follicular nodule (Bethesda category II)    Recommend follow-up thyroid  ultrasound a year from the last one Patient will get us  the report of the last thyroid  ultrasound from the PCP  I have reviewed current medications, nurse's notes, allergies, vital signs, past medical and surgical history, family medical history, and social history for this encounter. Counseled patient on symptoms, examination findings, lab findings, imaging results, treatment decisions and monitoring and prognosis. The patient understood the recommendations and agrees with the treatment plan. All questions regarding treatment plan were fully answered.   Return in about 6 months (around 11/04/2024) for visit + labs before next visit, labs today.   Kelly Birmingham, MD  05/04/24   I have reviewed current medications, nurse's notes, allergies, vital signs, past medical and surgical history, family medical history, and social history for this encounter. Counseled patient on symptoms, examination findings, lab findings, imaging results, treatment decisions and monitoring and prognosis. The patient understood the recommendations and agrees with the treatment plan. All questions regarding treatment plan were fully answered.   History of Present Illness Kelly Hansen is a 58 y.o. year old female who presents to our clinic with multiple thyroid  nodules diagnosed in 2024 and hypothyroidism years ago..    On levothyroxine 25 mcg every day    12/30/23 Specimen Submitted:  A. THYROID , LT MID, FINE NEEDLE ASPIRATION  Repeat BX of nodule #2: Maximum size: 3.0 x  2.3 x 2.0 cm. Location: Left; Midpole.  FINAL MICROSCOPIC DIAGNOSIS:  - Benign follicular nodule (Bethesda category II)   12/30/23 Specimen Submitted:  A. THYROID , LT INFERIOR, FINE NEEDLE ASPIRATION  Repeat BX of Nodule #: 3 Maximum size: 2.2 x 1.7 x 1.7 cm. Location:  Left; inferior pole.  FINAL MICROSCOPIC DIAGNOSIS:  - Benign follicular nodule (Bethesda category II)     FINAL MICROSCOPIC DIAGNOSIS:  - Benign follicular nodule (Bethesda category II)   Symptoms suggestive of HYPOTHYROIDISM:  fatigue Yes weight gain No cold intolerance  Yes constipation  No  Symptoms suggestive of HYPERTHYROIDISM:  weight loss  Yes heat intolerance No hyperdefecation  No palpitations  No  Compressive symptoms:  dysphagia  Yes, sometimes  dysphonia  Yes, sometimes  positional dyspnea (especially with simultaneous arms elevation)  No  Smokes  No On biotin  No Personal history of head/neck surgery/irradiation  No  Physical Exam  BP 120/80   Pulse 84   Ht 5' 2 (1.575 m)   Wt 199 lb (90.3 kg)   SpO2 96%   BMI 36.40 kg/m  Constitutional: well developed, well nourished Head: normocephalic, atraumatic, no exophthalmos Eyes: sclera anicteric, no redness Neck: no thyromegaly, no thyroid  tenderness; + ?L nodule palpated Lungs: normal respiratory effort Neurology: alert and oriented, no fine hand tremor Skin: dry, no appreciable rashes Musculoskeletal: no appreciable defects Psychiatric: normal mood and affect  Allergies Allergies  Allergen Reactions   Penicillins Rash   Sulfa Antibiotics Nausea And Vomiting and Rash    Current Medications Patient's Medications  New Prescriptions   No medications on file  Previous Medications   ALBUTEROL  (VENTOLIN  HFA) 108 (90 BASE) MCG/ACT INHALER    Inhale 2 puffs into the lungs 3 (three) times daily as needed for wheezing or shortness of breath.   ALPRAZOLAM  (XANAX ) 0.25 MG TABLET    Take 1 tablet (0.25 mg total) by mouth at bedtime as needed for sleep (sleep lab, bring this prescription to the sleep lab.).   DONEPEZIL  (ARICEPT ) 10 MG TABLET    Take 10 mg by mouth at bedtime.   DULOXETINE (CYMBALTA) 60 MG CAPSULE    Take 60 mg by mouth in the morning.   ERGOCALCIFEROL  (VITAMIN D2) 1.25 MG (50000 UT) CAPSULE     Take 1 capsule by mouth once a week.   FLUTICASONE -UMECLIDIN-VILANT (TRELEGY ELLIPTA) 100-62.5-25 MCG/ACT AEPB    Inhale 1 puff into the lungs daily.   GEMTESA  75 MG TABS    TAKE 1 TABLET BY MOUTH EVERY DAY   LEVOTHYROXINE (SYNTHROID) 25 MCG TABLET    Take 25 mcg by mouth daily before breakfast.   MECLIZINE  (ANTIVERT ) 12.5 MG TABLET    Take 12.5 mg by mouth 2 (two) times daily as needed for dizziness.   MELOXICAM (MOBIC) 7.5 MG TABLET    Take 7.5 mg by mouth daily.   MEMANTINE (NAMENDA) 10 MG TABLET    Take 10 mg by mouth 2 (two) times daily.   MONTELUKAST  (SINGULAIR ) 10 MG TABLET    Take 1 tablet by mouth at bedtime.   NITROGLYCERIN  (NITROSTAT ) 0.4 MG SL TABLET    Take 1 (one) Tablet as needed for chest pain-If not better in 5 minutes repeat x 2--If still not better--seek attention at ER/ Call 911   OLOPATADINE  (PATANOL) 0.1 % OPHTHALMIC SOLUTION    Place 1 drop into each eye as directed   PANTOPRAZOLE (PROTONIX) 40 MG TABLET    Take 40 mg by mouth in the morning.  PRAMIPEXOLE (MIRAPEX) 0.125 MG TABLET    Take 0.125 mg by mouth 2 (two) times daily.   PREGABALIN  (LYRICA ) 100 MG CAPSULE    Take 100 mg by mouth 3 (three) times daily.   ROSUVASTATIN  (CRESTOR ) 10 MG TABLET    Take 10 mg by mouth at bedtime.   TIZANIDINE (ZANAFLEX) 4 MG TABLET    Take 4 mg by mouth 2 (two) times daily.   TRAZODONE (DESYREL) 50 MG TABLET    Take 50 mg by mouth at bedtime as needed for sleep.  Modified Medications   No medications on file  Discontinued Medications   No medications on file    Past Medical History Past Medical History:  Diagnosis Date   Acid reflux    Angina pectoris (HCC) 02/12/2021   Arthritis of multiple sites    Arthropathy of lumbar facet joint 06/17/2022   Asthma    Atherosclerosis of aorta (HCC)    Chest pain of uncertain etiology 04/11/2021   Chronic insomnia 10/19/2023   CIN III with severe dysplasia 10/06/2016   Formatting of this note might be different from the original.  Reports [facility] between 1991-2004 for abnormal Pap followed by Pap q55mos then q25mos She then had TVH in 2004 with Dr. Lewanda Needs Pap smears for 20-25y post-hysterectomy due to presumed h/o CIN 2-3   Class 3 severe obesity due to excess calories with serious comorbidity and body mass index (BMI) of 40.0 to 44.9 in adult 10/19/2023   Degeneration of lumbar intervertebral disc 03/30/2020   Depression    Excessive daytime sleepiness 10/19/2023   Fibromyalgia    GERD (gastroesophageal reflux disease)    Hyperlipidemia    Hypotension 08/20/2023   Hypothyroid    Incontinence of feces with fecal urgency 09/04/2023   Increased body mass index 03/30/2020   Insomnia    Lumbar radiculopathy 06/24/2022   Lumbar spondylosis 06/17/2022   Mild CAD 04/11/2021   Morbid obesity (HCC) 02/12/2021   Nocturia more than twice per night 09/04/2023   OAB (overactive bladder) 09/04/2023   Orthopnea 10/19/2023   Pelvic floor dysfunction in female 09/04/2023   Peripheral neuropathy    Sleep apnea 10/19/2023   SUI (stress urinary incontinence, female) 09/04/2023   Thyroid  disease    Umbilical hernia    Vitamin D  deficiency     Past Surgical History Past Surgical History:  Procedure Laterality Date   ABDOMINAL HYSTERECTOMY     APPENDECTOMY     BIOPSY THYROID   08/26/2023   LEFT HEART CATH AND CORONARY ANGIOGRAPHY N/A 02/13/2021   Procedure: LEFT HEART CATH AND CORONARY ANGIOGRAPHY;  Surgeon: Wonda Sharper, MD;  Location: [facility] INVASIVE CV LAB;  Service: Cardiovascular;  Laterality: N/A;    Family History family history includes Asthma in her mother and sister; Cirrhosis in her brother; Colon cancer in her father; Dementia in her maternal aunt, maternal uncle, mother, and sister; Diabetes in her brother and mother; Heart disease in her brother and mother; Hyperlipidemia in her brother and mother; Hypertension in her brother and mother; Kidney disease in her brother and mother; Melanoma in her mother; Ovarian  cancer in her paternal aunt; Rectal cancer in her father; Skin cancer in her father; Stroke in her mother; Thyroid  cancer in her niece.  Social History Social History   Socioeconomic History   Marital status: Married    Spouse name: Dennette Faulconer   Number of children: Not on file   Years of education: Not on file   Highest education level: Not  on file  Occupational History   Not on file  Tobacco Use   Smoking status: Former    Current packs/day: 0.00    Types: Cigarettes    Quit date: 03/10/2011    Years since quitting: 13.1   Smokeless tobacco: Never  Vaping Use   Vaping status: Never Used  Substance and Sexual Activity   Alcohol use: No   Drug use: No   Sexual activity: Yes  Other Topics Concern   Not on file  Social History Narrative   Right handed   Lives with family    One story home few steps to get into home    Social Drivers of Health   Financial Resource Strain: Not on file  Food Insecurity: Not on file  Transportation Needs: Not on file  Physical Activity: Not on file  Stress: Not on file  Social Connections: Not on file  Intimate Partner Violence: Not on file    Laboratory Investigations Lab Results  Component Value Date   TSH 3.485 12/14/2023   FREET4 0.78 12/14/2023     No results found for: TSI   No components found for: TRAB   No results found for: CHOL No results found for: HDL No results found for: LDLCALC No results found for: TRIG No results found for: [facility] Lab Results  Component Value Date   CREATININE 1.16 (H) 12/14/2023   No results found for: GFR    Component Value Date/Time   NA 142 12/14/2023 1615   NA 140 02/12/2021 1141   K 4.3 12/14/2023 1615   CL 109 12/14/2023 1615   CO2 25 12/14/2023 1615   GLUCOSE 85 12/14/2023 1615   BUN 14 12/14/2023 1615   BUN 14 02/12/2021 1141   CREATININE 1.16 (H) 12/14/2023 1615   CALCIUM  9.8 12/14/2023 1615   PROT 7.9 10/04/2019 1905   ALBUMIN 4.4 10/04/2019 1905    AST 36 10/04/2019 1905   ALT 31 10/04/2019 1905   ALKPHOS 66 10/04/2019 1905   BILITOT 1.1 10/04/2019 1905   GFRNONAA 55 (L) 12/14/2023 1615   GFRAA >60 10/04/2019 1905      Latest Ref Rng & Units 12/14/2023    4:15 PM 02/12/2021   11:41 AM 10/04/2019    7:05 PM  BMP  Glucose 70 - 99 mg/dL 85  82  895   BUN 6 - 20 mg/dL 14  14  14    Creatinine 0.44 - 1.00 mg/dL 8.83  8.88  9.00   BUN/Creat Ratio 9 - 23  13    Sodium 135 - 145 mmol/L 142  140  143   Potassium 3.5 - 5.1 mmol/L 4.3  4.4  4.3   Chloride 98 - 111 mmol/L 109  100  108   CO2 22 - 32 mmol/L 25  24  23    Calcium  8.9 - 10.3 mg/dL 9.8  9.7  89.9        Component Value Date/Time   WBC 5.8 12/14/2023 1615   RBC 4.55 12/14/2023 1615   HGB 13.7 12/14/2023 1615   HGB 14.5 02/12/2021 1141   HCT 42.9 12/14/2023 1615   HCT 43.9 02/12/2021 1141   PLT 204 12/14/2023 1615   PLT 302 02/12/2021 1141   MCV 94.3 12/14/2023 1615   MCV 91 02/12/2021 1141   MCH 30.1 12/14/2023 1615   MCHC 31.9 12/14/2023 1615   RDW 13.2 12/14/2023 1615   RDW 13.3 02/12/2021 1141   LYMPHSABS 2.2 12/14/2023 1615   LYMPHSABS 3.2 (H)  02/12/2021 1141   MONOABS 0.5 12/14/2023 1615   EOSABS 0.2 12/14/2023 1615   EOSABS 0.4 02/12/2021 1141   BASOSABS 0.0 12/14/2023 1615   BASOSABS 0.1 02/12/2021 1141      Parts of this note may have been dictated using voice recognition software. There may be variances in spelling and vocabulary which are unintentional. Not all errors are proofread. Please notify the dino if any discrepancies are noted or if the meaning of any statement is not clear.

## 2024-05-11 ENCOUNTER — Ambulatory Visit: Payer: Self-pay | Admitting: "Endocrinology

## 2024-05-11 ENCOUNTER — Telehealth: Payer: Self-pay | Admitting: "Endocrinology

## 2024-05-11 NOTE — Telephone Encounter (Signed)
Notes has been faxed.

## 2024-05-11 NOTE — Telephone Encounter (Signed)
 [facility] is calling to request office notes from patient's office visit on 05/04/2024 with Dr. Dartha.  Dr. Brad Badder was the referring provider and the fax number is [phone].

## 2024-05-17 DIAGNOSIS — N390 Urinary tract infection, site not specified: Secondary | ICD-10-CM | POA: Diagnosis not present

## 2024-05-17 DIAGNOSIS — M797 Fibromyalgia: Secondary | ICD-10-CM | POA: Diagnosis not present

## 2024-05-17 DIAGNOSIS — K219 Gastro-esophageal reflux disease without esophagitis: Secondary | ICD-10-CM | POA: Diagnosis not present

## 2024-05-17 DIAGNOSIS — F320 Major depressive disorder, single episode, mild: Secondary | ICD-10-CM | POA: Diagnosis not present

## 2024-06-13 DIAGNOSIS — F320 Major depressive disorder, single episode, mild: Secondary | ICD-10-CM | POA: Diagnosis not present

## 2024-06-13 DIAGNOSIS — N3281 Overactive bladder: Secondary | ICD-10-CM | POA: Diagnosis not present

## 2024-06-13 DIAGNOSIS — M545 Low back pain, unspecified: Secondary | ICD-10-CM | POA: Diagnosis not present

## 2024-06-13 DIAGNOSIS — E785 Hyperlipidemia, unspecified: Secondary | ICD-10-CM | POA: Diagnosis not present

## 2024-06-13 DIAGNOSIS — E559 Vitamin D deficiency, unspecified: Secondary | ICD-10-CM | POA: Diagnosis not present

## 2024-06-27 DIAGNOSIS — Z6836 Body mass index (BMI) 36.0-36.9, adult: Secondary | ICD-10-CM | POA: Diagnosis not present

## 2024-06-27 DIAGNOSIS — E039 Hypothyroidism, unspecified: Secondary | ICD-10-CM | POA: Diagnosis not present

## 2024-06-27 DIAGNOSIS — E66812 Obesity, class 2: Secondary | ICD-10-CM | POA: Diagnosis not present

## 2024-06-27 DIAGNOSIS — R42 Dizziness and giddiness: Secondary | ICD-10-CM | POA: Diagnosis not present

## 2024-06-27 DIAGNOSIS — I959 Hypotension, unspecified: Secondary | ICD-10-CM | POA: Diagnosis not present

## 2024-07-06 ENCOUNTER — Encounter (HOSPITAL_COMMUNITY): Payer: Self-pay

## 2024-07-06 ENCOUNTER — Other Ambulatory Visit: Payer: Self-pay

## 2024-07-06 ENCOUNTER — Observation Stay (HOSPITAL_COMMUNITY)
Admission: EM | Admit: 2024-07-06 | Discharge: 2024-07-07 | Disposition: A | Discharge status: Home / Self Care | Source: Ambulatory Visit | Attending: Internal Medicine | Admitting: Internal Medicine

## 2024-07-06 ENCOUNTER — Emergency Department (HOSPITAL_COMMUNITY)

## 2024-07-06 DIAGNOSIS — I959 Hypotension, unspecified: Secondary | ICD-10-CM | POA: Diagnosis not present

## 2024-07-06 DIAGNOSIS — E785 Hyperlipidemia, unspecified: Secondary | ICD-10-CM | POA: Insufficient documentation

## 2024-07-06 DIAGNOSIS — R0989 Other specified symptoms and signs involving the circulatory and respiratory systems: Secondary | ICD-10-CM | POA: Diagnosis not present

## 2024-07-06 DIAGNOSIS — G629 Polyneuropathy, unspecified: Secondary | ICD-10-CM | POA: Diagnosis not present

## 2024-07-06 DIAGNOSIS — I129 Hypertensive chronic kidney disease with stage 1 through stage 4 chronic kidney disease, or unspecified chronic kidney disease: Secondary | ICD-10-CM | POA: Diagnosis not present

## 2024-07-06 DIAGNOSIS — E782 Mixed hyperlipidemia: Secondary | ICD-10-CM | POA: Diagnosis not present

## 2024-07-06 DIAGNOSIS — I517 Cardiomegaly: Secondary | ICD-10-CM | POA: Diagnosis not present

## 2024-07-06 DIAGNOSIS — R001 Bradycardia, unspecified: Principal | ICD-10-CM | POA: Insufficient documentation

## 2024-07-06 DIAGNOSIS — R42 Dizziness and giddiness: Secondary | ICD-10-CM | POA: Diagnosis not present

## 2024-07-06 DIAGNOSIS — E538 Deficiency of other specified B group vitamins: Secondary | ICD-10-CM | POA: Insufficient documentation

## 2024-07-06 DIAGNOSIS — R609 Edema, unspecified: Secondary | ICD-10-CM | POA: Insufficient documentation

## 2024-07-06 DIAGNOSIS — I2510 Atherosclerotic heart disease of native coronary artery without angina pectoris: Secondary | ICD-10-CM | POA: Diagnosis not present

## 2024-07-06 DIAGNOSIS — R531 Weakness: Secondary | ICD-10-CM | POA: Diagnosis not present

## 2024-07-06 DIAGNOSIS — N182 Chronic kidney disease, stage 2 (mild): Secondary | ICD-10-CM | POA: Insufficient documentation

## 2024-07-06 DIAGNOSIS — J45909 Unspecified asthma, uncomplicated: Secondary | ICD-10-CM | POA: Diagnosis not present

## 2024-07-06 DIAGNOSIS — D519 Vitamin B12 deficiency anemia, unspecified: Secondary | ICD-10-CM | POA: Insufficient documentation

## 2024-07-06 DIAGNOSIS — R0602 Shortness of breath: Secondary | ICD-10-CM | POA: Insufficient documentation

## 2024-07-06 DIAGNOSIS — Z79899 Other long term (current) drug therapy: Secondary | ICD-10-CM | POA: Insufficient documentation

## 2024-07-06 DIAGNOSIS — Z87891 Personal history of nicotine dependence: Secondary | ICD-10-CM | POA: Diagnosis not present

## 2024-07-06 DIAGNOSIS — R079 Chest pain, unspecified: Secondary | ICD-10-CM | POA: Diagnosis not present

## 2024-07-06 LAB — COMPREHENSIVE METABOLIC PANEL WITH GFR
ALT: 15 U/L (ref 0–44)
AST: 18 U/L (ref 15–41)
Albumin: 3.9 g/dL (ref 3.5–5.0)
Alkaline Phosphatase: 71 U/L (ref 38–126)
Anion gap: 9 (ref 5–15)
BUN: 17 mg/dL (ref 6–20)
CO2: 25 mmol/L (ref 22–32)
Calcium: 9.6 mg/dL (ref 8.9–10.3)
Chloride: 105 mmol/L (ref 98–111)
Creatinine, Ser: 1.44 mg/dL — ABNORMAL HIGH (ref 0.44–1.00)
GFR, Estimated: 42 mL/min — ABNORMAL LOW (ref >60)
Glucose, Bld: 90 mg/dL (ref 70–99)
Potassium: 4.2 mmol/L (ref 3.5–5.1)
Sodium: 139 mmol/L (ref 135–145)
Total Bilirubin: 0.5 mg/dL (ref 0.0–1.2)
Total Protein: 5.9 g/dL — ABNORMAL LOW (ref 6.5–8.1)

## 2024-07-06 LAB — CBC WITH DIFFERENTIAL/PLATELET
Abs Immature Granulocytes: 0.03 K/uL (ref 0.00–0.07)
Basophils Absolute: 0.1 K/uL (ref 0.0–0.1)
Basophils Relative: 1 %
Eosinophils Absolute: 0.2 K/uL (ref 0.0–0.5)
Eosinophils Relative: 2 %
HCT: 42.6 % (ref 36.0–46.0)
Hemoglobin: 13.8 g/dL (ref 12.0–15.0)
Immature Granulocytes: 0 %
Lymphocytes Relative: 32 %
Lymphs Abs: 2.8 K/uL (ref 0.7–4.0)
MCH: 30.8 pg (ref 26.0–34.0)
MCHC: 32.4 g/dL (ref 30.0–36.0)
MCV: 95.1 fL (ref 80.0–100.0)
Monocytes Absolute: 1 K/uL (ref 0.1–1.0)
Monocytes Relative: 11 %
Neutro Abs: 4.8 K/uL (ref 1.7–7.7)
Neutrophils Relative %: 54 %
Platelets: 265 K/uL (ref 150–400)
RBC: 4.48 MIL/uL (ref 3.87–5.11)
RDW: 13.1 % (ref 11.5–15.5)
WBC: 8.8 K/uL (ref 4.0–10.5)
nRBC: 0 % (ref 0.0–0.2)

## 2024-07-06 LAB — PRO BRAIN NATRIURETIC PEPTIDE: Pro Brain Natriuretic Peptide: 73.2 pg/mL (ref <300.0)

## 2024-07-06 LAB — MAGNESIUM: Magnesium: 2.1 mg/dL (ref 1.7–2.4)

## 2024-07-06 LAB — VITAMIN B12: Vitamin B-12: <150 pg/mL — ABNORMAL LOW (ref 180–914)

## 2024-07-06 LAB — TROPONIN T, HIGH SENSITIVITY
Troponin T High Sensitivity: <15 ng/L (ref 0–19)
Troponin T High Sensitivity: <15 ng/L (ref 0–19)
Troponin T High Sensitivity: <15 ng/L (ref 0–19)

## 2024-07-06 LAB — TSH: TSH: 2.07 u[IU]/mL (ref 0.350–4.500)

## 2024-07-06 LAB — T4, FREE: Free T4: 0.82 ng/dL (ref 0.61–1.12)

## 2024-07-06 MED ORDER — DONEPEZIL HCL 5 MG PO TABS
5.0000 mg | ORAL_TABLET | Freq: Every day | ORAL | Status: DC
  Administered 2024-07-06: 5 mg via ORAL
  Filled 2024-07-06: qty 1

## 2024-07-06 MED ORDER — LACTATED RINGERS IV BOLUS
1000.0000 mL | Freq: Once | INTRAVENOUS | Status: AC
  Administered 2024-07-06: 1000 mL via INTRAVENOUS

## 2024-07-06 MED ORDER — ROSUVASTATIN CALCIUM 10 MG PO TABS
10.0000 mg | ORAL_TABLET | Freq: Every day | ORAL | Status: DC
  Administered 2024-07-06: 10 mg via ORAL
  Filled 2024-07-06: qty 1

## 2024-07-06 MED ORDER — TIZANIDINE HCL 2 MG PO TABS: 2.0000 mg | ORAL_TABLET | Freq: Two times a day (BID) | ORAL | Status: DC | PRN

## 2024-07-06 MED ORDER — SODIUM CHLORIDE 0.9 % IV SOLN: INTRAVENOUS | Status: DC

## 2024-07-06 MED ORDER — DULOXETINE HCL 30 MG PO CPEP
30.0000 mg | ORAL_CAPSULE | Freq: Every morning | ORAL | Status: DC
  Administered 2024-07-07: 30 mg via ORAL
  Filled 2024-07-06: qty 1

## 2024-07-06 MED ORDER — ACETAMINOPHEN 650 MG RE SUPP: 650.0000 mg | Freq: Four times a day (QID) | RECTAL | Status: DC | PRN

## 2024-07-06 MED ORDER — MONTELUKAST SODIUM 10 MG PO TABS
10.0000 mg | ORAL_TABLET | Freq: Every day | ORAL | Status: DC
  Administered 2024-07-06: 10 mg via ORAL
  Filled 2024-07-06: qty 1

## 2024-07-06 MED ORDER — ACETAMINOPHEN 325 MG PO TABS: 650.0000 mg | ORAL_TABLET | Freq: Four times a day (QID) | ORAL | Status: DC | PRN

## 2024-07-06 MED ORDER — ALBUTEROL SULFATE (2.5 MG/3ML) 0.083% IN NEBU: 2.5000 mg | INHALATION_SOLUTION | RESPIRATORY_TRACT | Status: DC | PRN

## 2024-07-06 MED ORDER — NITROFURANTOIN MACROCRYSTAL 100 MG PO CAPS
100.0000 mg | ORAL_CAPSULE | Freq: Two times a day (BID) | ORAL | Status: DC
  Administered 2024-07-06: 100 mg via ORAL
  Filled 2024-07-06 (×4): qty 1

## 2024-07-06 MED ORDER — PANTOPRAZOLE SODIUM 40 MG PO TBEC
40.0000 mg | DELAYED_RELEASE_TABLET | Freq: Every morning | ORAL | Status: DC
  Administered 2024-07-07: 40 mg via ORAL
  Filled 2024-07-06: qty 1

## 2024-07-06 MED ORDER — LACTATED RINGERS IV BOLUS
500.0000 mL | Freq: Once | INTRAVENOUS | Status: AC
  Administered 2024-07-06: 500 mL via INTRAVENOUS

## 2024-07-06 MED ORDER — CYANOCOBALAMIN 1000 MCG/ML IJ SOLN
1000.0000 ug | Freq: Every day | INTRAMUSCULAR | Status: DC
  Administered 2024-07-07: 1000 ug via INTRAMUSCULAR
  Filled 2024-07-06: qty 1

## 2024-07-06 MED ORDER — HEPARIN SODIUM (PORCINE) 5000 UNIT/ML IJ SOLN: 5000.0000 [IU] | Freq: Three times a day (TID) | INTRAMUSCULAR | Status: DC

## 2024-07-06 MED ORDER — MEMANTINE HCL 10 MG PO TABS
5.0000 mg | ORAL_TABLET | Freq: Every day | ORAL | Status: DC
  Administered 2024-07-07: 5 mg via ORAL
  Filled 2024-07-06: qty 1

## 2024-07-06 MED ORDER — MIRABEGRON ER 25 MG PO TB24
25.0000 mg | ORAL_TABLET | Freq: Every day | ORAL | Status: DC
  Administered 2024-07-07: 25 mg via ORAL
  Filled 2024-07-06: qty 1

## 2024-07-06 MED ORDER — PREGABALIN 50 MG PO CAPS
50.0000 mg | ORAL_CAPSULE | Freq: Two times a day (BID) | ORAL | Status: DC
  Administered 2024-07-06 – 2024-07-07 (×2): 50 mg via ORAL
  Filled 2024-07-06 (×2): qty 1

## 2024-07-06 MED ORDER — TRAZODONE HCL 50 MG PO TABS
50.0000 mg | ORAL_TABLET | Freq: Every day | ORAL | Status: DC
  Administered 2024-07-06: 50 mg via ORAL
  Filled 2024-07-06: qty 1

## 2024-07-06 MED ORDER — BUDESON-GLYCOPYRROL-FORMOTEROL 160-9-4.8 MCG/ACT IN AERO
2.0000 | INHALATION_SPRAY | Freq: Two times a day (BID) | RESPIRATORY_TRACT | Status: DC
  Administered 2024-07-06 – 2024-07-07 (×2): 2 via RESPIRATORY_TRACT

## 2024-07-06 MED ORDER — OLOPATADINE HCL 0.1 % OP SOLN
1.0000 [drp] | Freq: Every day | OPHTHALMIC | Status: DC | PRN
  Administered 2024-07-06: 1 [drp] via OPHTHALMIC

## 2024-07-06 NOTE — H&P (Addendum)
 [facility] H&P   Patient Demographics:    Kelly Hansen, is a 58 y.o. female  MRN: [id number]   DOB - 03/21/1966  Admit Date - 07/06/2024  Outpatient Primary MD for the patient is Gable Cambric, MD  Referring MD/NP/PA: Dr Arno  Patient coming from: home  Chief Complaint  Patient presents with   Hypotension      HPI:    Kelly Hansen  is a 58 y.o. female, with past medical history of asthma, hypertension, patient is following with cardiology cardiac bradycardia and endocrinology guarding thyroid  nodule, and reports she was told by her cardiologist to her bradycardia and related to cardiac issue, reports generalized weakness, fatigue, she noted her blood pressure to be low and heart rate to be low today she called her PCP which instructed her to come to ED, patient reports generalized weakness and fatigue.  Does appear she is on multiple medications, she was noted to be on Namenda and Aricept , she reports this is as she is forgetful, she does not have diagnosis of dementia, and she is on scheduled Lyrica , Zanaflex, Remeron and Cymbalta. - In ED workup significant for heart rate in the 50s, no arrhythmias or heart block, creatinine at baseline 1.44, troponins negative x 2, TSH within normal limit at 2.070, [facility] hospitalist consulted to admit    Review of systems:      A full 10 point Review of Systems was done, except as stated above, all other Review of Systems were negative.   With Past History of the following :    Past Medical History:  Diagnosis Date   Acid reflux    Angina pectoris 02/12/2021   Arthritis of multiple sites    Arthropathy of lumbar facet joint 06/17/2022   Asthma    Atherosclerosis of aorta    Chest pain of uncertain etiology 04/11/2021   Chronic insomnia 10/19/2023   CIN III with severe dysplasia 10/06/2016   Formatting of this note might be different  from the original. Reports [facility] between 1991-2004 for abnormal Pap followed by Pap q72mos then q5mos She then had TVH in 2004 with Dr. Lewanda Needs Pap smears for 20-25y post-hysterectomy due to presumed h/o CIN 2-3   Class 3 severe obesity due to excess calories with serious comorbidity and body mass index (BMI) of 40.0 to 44.9 in adult [facility]) 10/19/2023   Degeneration of lumbar intervertebral disc 03/30/2020   Depression    Excessive daytime sleepiness 10/19/2023   Fibromyalgia    GERD (gastroesophageal reflux disease)    Hyperlipidemia    Hypotension 08/20/2023   Hypothyroid    Incontinence of feces with fecal urgency 09/04/2023   Increased body mass index 03/30/2020   Insomnia    Lumbar radiculopathy 06/24/2022   Lumbar spondylosis 06/17/2022   Mild CAD 04/11/2021   Morbid obesity (HCC) 02/12/2021   Nocturia more than twice per night 09/04/2023  OAB (overactive bladder) 09/04/2023   Orthopnea 10/19/2023   Pelvic floor dysfunction in female 09/04/2023   Peripheral neuropathy    Sleep apnea 10/19/2023   SUI (stress urinary incontinence, female) 09/04/2023   Thyroid  disease    Umbilical hernia    Vitamin D  deficiency       Past Surgical History:  Procedure Laterality Date   ABDOMINAL HYSTERECTOMY     APPENDECTOMY     BIOPSY THYROID   08/26/2023   LEFT HEART CATH AND CORONARY ANGIOGRAPHY N/A 02/13/2021   Procedure: LEFT HEART CATH AND CORONARY ANGIOGRAPHY;  Surgeon: Wonda Sharper, MD;  Location: [facility] INVASIVE CV LAB;  Service: Cardiovascular;  Laterality: N/A;      Social History:     Social History   Tobacco Use   Smoking status: Former    Current packs/day: 0.00    Types: Cigarettes    Quit date: 03/10/2011    Years since quitting: 13.3   Smokeless tobacco: Never  Substance Use Topics   Alcohol use: No       Family History :     Family History  Problem Relation Age of Onset   Dementia Mother    Stroke Mother    Hypertension Mother    Asthma Mother     Hyperlipidemia Mother    Melanoma Mother    Diabetes Mother    Heart disease Mother    Kidney disease Mother    Skin cancer Father    Rectal cancer Father    Colon cancer Father    Dementia Sister    Asthma Sister    Cirrhosis Brother    Hypertension Brother    Hyperlipidemia Brother    Diabetes Brother    Heart disease Brother    Kidney disease Brother    Dementia Maternal Aunt    Dementia Maternal Uncle    Ovarian cancer Paternal Aunt    Thyroid  cancer Niece    Bladder Cancer Neg Hx    Uterine cancer Neg Hx    Kidney cancer Neg Hx      Home Medications:   Prior to Admission medications   Medication Sig Start Date End Date Taking? Authorizing Provider  albuterol  (VENTOLIN  HFA) 108 (90 Base) MCG/ACT inhaler Inhale 2 puffs into the lungs 3 (three) times daily as needed for wheezing or shortness of breath.   Yes [provider]  donepezil  (ARICEPT ) 10 MG tablet Take 10 mg by mouth at bedtime. 06/01/19  Yes [provider]  DULoxetine (CYMBALTA) 60 MG capsule Take 60 mg by mouth in the morning. 05/26/19  Yes [provider]  ergocalciferol  (VITAMIN D2) 1.25 MG (50000 UT) capsule Take 1 capsule by mouth once a week. Patient taking differently: Take 50,000 Units by mouth every 30 (thirty) days. 05/13/21  Yes   Fluticasone -Umeclidin-Vilant (TRELEGY ELLIPTA) 100-62.5-25 MCG/ACT AEPB Inhale 1 puff into the lungs daily.   Yes [provider]  GEMTESA  75 MG TABS TAKE 1 TABLET BY MOUTH EVERY DAY 03/15/24  Yes Guadlupe Dull T, MD  meclizine  (ANTIVERT ) 12.5 MG tablet Take 12.5 mg by mouth 2 (two) times daily as needed for dizziness.   Yes [provider]  meloxicam (MOBIC) 7.5 MG tablet Take 7.5 mg by mouth daily. 01/18/24  Yes [provider]  memantine (NAMENDA) 10 MG tablet Take 10 mg by mouth 2 (two) times daily.   Yes [provider]  montelukast  (SINGULAIR ) 10 MG tablet Take 1 tablet by mouth at bedtime. 04/08/21  Yes  nitrofurantoin  (MACRODANTIN ) 100 MG capsule Take 100 mg by mouth 2 (two) times daily. 07/04/24 07/09/24 Yes [provider]  nitroGLYCERIN  (NITROSTAT ) 0.4 MG SL tablet Take 1 (one) Tablet as needed for chest pain-If not better in 5 minutes repeat x 2--If still not better--seek attention at ER/ Call 911 02/08/21  Yes   olopatadine  (PATANOL) 0.1 % ophthalmic solution Place 1 drop into each eye as directed Patient taking differently: Place 1 drop into both eyes daily as needed for allergies. 03/13/21  Yes   pantoprazole (PROTONIX) 40 MG tablet Take 40 mg by mouth in the morning.   Yes [provider]  pregabalin  (LYRICA ) 100 MG capsule Take 100 mg by mouth 2 (two) times daily.   Yes [provider]  rosuvastatin  (CRESTOR ) 10 MG tablet Take 10 mg by mouth at bedtime. 05/24/19  Yes [provider]  tiZANidine (ZANAFLEX) 4 MG tablet Take 4 mg by mouth 2 (two) times daily. 04/25/23  Yes [provider]  traZODone (DESYREL) 50 MG tablet Take 50 mg by mouth at bedtime.   Yes [provider]     Allergies:     Allergies  Allergen Reactions   Sulfa Antibiotics Nausea And Vomiting and Rash   Penicillins Rash     Physical Exam:   Vitals  Blood pressure 134/73, pulse 62, temperature 97.7 F (36.5 C), temperature source Oral, resp. rate 14, height 5' 2 (1.575 m), weight 90.3 kg, SpO2 98%.   1. General Developed female, laying in bed, no apparent distress  2. Normal affect and insight, Not Suicidal or Homicidal, Awake Alert, Oriented X 3.  3. No F.N deficits, ALL C.Nerves Intact, Strength 5/5 all 4 extremities, Sensation intact all 4 extremities, Plantars down going.  4. Ears and Eyes appear Normal, Conjunctivae clear, PERRLA. Moist Oral Mucosa.  5. Supple Neck, No JVD, No cervical lymphadenopathy appriciated, No Carotid Bruits.  6. Symmetrical Chest wall movement, Good air movement bilaterally, CTAB.  7.  Bradycardic, No Gallops, Rubs or  Murmurs, No Parasternal Heave.  8. Positive Bowel Sounds, Abdomen Soft, No tenderness, No organomegaly appriciated,No rebound -guarding or rigidity.  9.  No Cyanosis, Normal Skin Turgor, No Skin Rash or Bruise.  10. Good muscle tone,  joints appear normal , no effusions, Normal ROM.     Data Review:    CBC Recent Labs  Lab 07/06/24 1506  WBC 8.8  HGB 13.8  HCT 42.6  PLT 265  MCV 95.1  MCH 30.8  MCHC 32.4  RDW 13.1  LYMPHSABS 2.8  MONOABS 1.0  EOSABS 0.2  BASOSABS 0.1   ------------------------------------------------------------------------------------------------------------------  Chemistries  Recent Labs  Lab 07/06/24 1506  NA 139  K 4.2  CL 105  CO2 25  GLUCOSE 90  BUN 17  CREATININE 1.44*  CALCIUM  9.6  MG 2.1  AST 18  ALT 15  ALKPHOS 71  BILITOT 0.5   ------------------------------------------------------------------------------------------------------------------ estimated creatinine clearance is 44.5 mL/min (A) (by C-G formula based on SCr of 1.44 mg/dL (H)). ------------------------------------------------------------------------------------------------------------------ Recent Labs    07/06/24 1549  TSH 2.070    Coagulation profile No results for input(s): INR, PROTIME in the last 168 hours. ------------------------------------------------------------------------------------------------------------------- No results for input(s): DDIMER in the last 72 hours. -------------------------------------------------------------------------------------------------------------------  Cardiac Enzymes No results for input(s): CKMB, TROPONINI, MYOGLOBIN in the last 168 hours.  Invalid input(s): CK ------------------------------------------------------------------------------------------------------------------ No results found for:  BNP   ---------------------------------------------------------------------------------------------------------------  Urinalysis    Component Value Date/Time   COLORURINE YELLOW 10/04/2019 2115   APPEARANCEUR  CLEAR 10/04/2019 2115   LABSPEC 1.026 10/04/2019 2115   PHURINE 6.0 10/04/2019 2115   GLUCOSEU NEGATIVE 10/04/2019 2115   HGBUR NEGATIVE 10/04/2019 2115   BILIRUBINUR Small 11/26/2023 0812   KETONESUR 20 (A) 10/04/2019 2115   PROTEINUR Positive (A) 11/26/2023 0812   PROTEINUR 30 (A) 10/04/2019 2115   UROBILINOGEN 1.0 11/26/2023 0812   UROBILINOGEN 0.2 04/02/2015 1900   NITRITE Negative 11/26/2023 0812   NITRITE NEGATIVE 10/04/2019 2115   LEUKOCYTESUR Negative 11/26/2023 0812   LEUKOCYTESUR NEGATIVE 10/04/2019 2115    ----------------------------------------------------------------------------------------------------------------   Imaging Results:    DG Chest Port 1 View Result Date: 07/06/2024 CLINICAL DATA:  Chest pain. EXAM: PORTABLE CHEST 1 VIEW COMPARISON:  Chest radiograph dated 12/14/2023 FINDINGS: No focal consolidation, pleural effusion or pneumothorax. There is mild cardiomegaly with mild central vascular congestion. No acute osseous pathology. IMPRESSION: Mild cardiomegaly with mild central vascular congestion. Electronically Signed   By: Vanetta Chou M.D.   On: 07/06/2024 15:16     EKG: R  Vent. rate 51 BPM PR interval 140 ms QRS duration 100 ms QT/QTcB 462/425 ms P-R-T axes 65 68 44 Sinus bradycardia Otherwise normal ECG Compared with EKG from 12/14/2023    Assessment & Plan:    Principal Problem:   Bradycardia  Bradycardia - She is not on any rate blocking agent - Bradycardia most likely medication related as it does appear it has been an issue over the last year, and this is after she was started on multiple medications on November 2024. - Monitor on telemetry. - Continue with IV fluids. - I have discussed at length with her, her  sister and her husband, she is on multiple medications will contribute to bradycardia - She has no history of dementia, was given Aricept  and Namenda for her issues, so both will need to be discontinued gradually, especially Aricept  which causes severe bradycardia, so we will decrease Aricept  and Namenda by 50% with a gradual discontinuation as an outpatient. - Will decrease her tizanidine by 50% and changed to as needed from 4 mg twice daily to 2 mg twice daily as needed as it does cause severe bradycardia    Weakness and fatigue - This most likely in the setting of her bradycardia.  As well multiple medications causes weakness and fatigue - TSH within normal limit - Will decrease Lyrica  from 100 mg twice daily to 50 mg twice daily - Crease Cymbalta from 60 mg to 30 mg daily  Neuropathy - Patient report diagnosis of peripheral neuropathy, but no acute etiology - I will check  B12(especially patient with memory problems, it does appear in 2021 her B12 was low) - Please see above discussion regarding decreasing Lyrica  and Cymbalta  CKd stage IIa - Appears at baseline, continue with IV fluids  Hyperlipidemia -Continue with home dose statin  Addendum - B12 came significantly low < 150, this would explain her neuropathic pain and memory issues, she will be started on B12 IM daily x 7 days, then weekly x 4 doses then monthly, discussed with her and her sister plan to discharge on IM B12 injections and to follow with PCP as an outpatient.  DVT Prophylaxis Heparin    AM Labs Ordered, also please review Full Orders  Family Communication: Admission, patients condition and plan of care including tests being ordered have been discussed with the patient and sister and husband who indicate understanding and agree with the plan and Code Status.  Code Status full code  Likely DC to home  Consults called: None  Admission status: None  Time spent in minutes : 70 minutes   Brayton Lye  M.D on 07/06/2024 at 8:38 PM   [facility] - Office  [phone]

## 2024-07-06 NOTE — ED Provider Notes (Signed)
 [place] EMERGENCY DEPARTMENT AT [facility] Provider Note   CSN: [id number] Arrival date & time: 07/06/24  1439     Patient presents with: Hypotension   Kelly Hansen is a 58 y.o. female.  {Add pertinent medical, surgical, social history, OB history to HPI:2133} 58 year old female presents for evaluation of hypertension asthma heart rate.  States her heart rate has been in the 40s and blood pressure has been in the 90s.  States has been very lightheaded and dizzy and feeling weak.  States she is seeing cardiology and endocrine for this before and they thought it might be her thyroids.  Patient also states she was seen by cardiology is not her heart.  She states usually her heart rate is 60 to 70s.  She states she started feeling weak yesterday.  States when she stands up she gets very lightheaded and dizzy.  Denies any other symptoms or concerns.        Prior to Admission medications   Medication Sig Start Date End Date Taking? Authorizing Provider  albuterol  (VENTOLIN  HFA) 108 (90 Base) MCG/ACT inhaler Inhale 2 puffs into the lungs 3 (three) times daily as needed for wheezing or shortness of breath.    [provider]  ALPRAZolam  (XANAX ) 0.25 MG tablet Take 1 tablet (0.25 mg total) by mouth at bedtime as needed for sleep (sleep lab, bring this prescription to the sleep lab.). 10/19/23   Dohmeier, Dedra, MD  donepezil  (ARICEPT ) 10 MG tablet Take 10 mg by mouth at bedtime. 06/01/19   [provider]  DULoxetine (CYMBALTA) 60 MG capsule Take 60 mg by mouth in the morning. 05/26/19   [provider]  ergocalciferol  (VITAMIN D2) 1.25 MG (50000 UT) capsule Take 1 capsule by mouth once a week. 05/13/21     Fluticasone -Umeclidin-Vilant (TRELEGY ELLIPTA) 100-62.5-25 MCG/ACT AEPB Inhale 1 puff into the lungs daily.    [provider]  GEMTESA  75 MG TABS TAKE 1 TABLET BY MOUTH EVERY DAY 03/15/24   Guadlupe Dull T, MD  levothyroxine (SYNTHROID) 25 MCG  tablet Take 25 mcg by mouth daily before breakfast.    [provider]  meclizine  (ANTIVERT ) 12.5 MG tablet Take 12.5 mg by mouth 2 (two) times daily as needed for dizziness.    [provider]  meloxicam (MOBIC) 7.5 MG tablet Take 7.5 mg by mouth daily. 01/18/24   [provider]  memantine (NAMENDA) 10 MG tablet Take 10 mg by mouth 2 (two) times daily.    [provider]  montelukast  (SINGULAIR ) 10 MG tablet Take 1 tablet by mouth at bedtime. 04/08/21     nitroGLYCERIN  (NITROSTAT ) 0.4 MG SL tablet Take 1 (one) Tablet as needed for chest pain-If not better in 5 minutes repeat x 2--If still not better--seek attention at ER/ Call 911 02/08/21     olopatadine  (PATANOL) 0.1 % ophthalmic solution Place 1 drop into each eye as directed 03/13/21     pantoprazole (PROTONIX) 40 MG tablet Take 40 mg by mouth in the morning.    [provider]  pramipexole (MIRAPEX) 0.125 MG tablet Take 0.125 mg by mouth 2 (two) times daily. 05/26/19   [provider]  pregabalin  (LYRICA ) 100 MG capsule Take 100 mg by mouth 3 (three) times daily.    [provider]  rosuvastatin  (CRESTOR ) 10 MG tablet Take 10 mg by mouth at bedtime. 05/24/19   [provider]  tiZANidine (ZANAFLEX) 4 MG tablet Take 4 mg by mouth 2 (two) times  daily. 04/25/23   [provider]  traZODone (DESYREL) 50 MG tablet Take 50 mg by mouth at bedtime as needed for sleep.    [provider]    Allergies: Sulfa antibiotics and Penicillins    Review of Systems  Constitutional:  Positive for fatigue. Negative for chills and fever.  HENT:  Negative for ear pain and sore throat.   Eyes:  Negative for pain and visual disturbance.  Respiratory:  Negative for cough and shortness of breath.   Cardiovascular:  Negative for chest pain and palpitations.  Gastrointestinal:  Negative for abdominal pain and vomiting.  Genitourinary:  Negative for dysuria and hematuria.   Musculoskeletal:  Negative for arthralgias and back pain.  Skin:  Negative for color change and rash.  Neurological:  Positive for light-headedness. Negative for seizures and syncope.  All other systems reviewed and are negative.   Updated Vital Signs BP 114/65   Pulse (!) 51   Temp 97.7 F (36.5 C) (Oral)   Resp 16   Ht 5' 2 (1.575 m)   Wt 90.3 kg   SpO2 95%   BMI 36.41 kg/m   Physical Exam Vitals and nursing note reviewed.  Constitutional:      General: She is not in acute distress.    Appearance: She is well-developed.  HENT:     Head: Normocephalic and atraumatic.  Eyes:     Conjunctiva/sclera: Conjunctivae normal.  Cardiovascular:     Rate and Rhythm: Regular rhythm. Bradycardia present.     Heart sounds: No murmur heard. Pulmonary:     Effort: Pulmonary effort is normal. No respiratory distress.     Breath sounds: Normal breath sounds.  Abdominal:     Palpations: Abdomen is soft.     Tenderness: There is no abdominal tenderness.  Musculoskeletal:        General: No swelling.     Cervical back: Neck supple.  Skin:    General: Skin is warm and dry.     Capillary Refill: Capillary refill takes less than 2 seconds.  Neurological:     Mental Status: She is alert.  Psychiatric:        Mood and Affect: Mood normal.     (all labs ordered are listed, but only abnormal results are displayed) Labs Reviewed  COMPREHENSIVE METABOLIC PANEL WITH GFR - Abnormal; Notable for the following components:      Result Value   Creatinine, Ser 1.44 (*)    Total Protein 5.9 (*)    GFR, Estimated 42 (*)    All other components within normal limits  CBC WITH DIFFERENTIAL/PLATELET  PRO BRAIN NATRIURETIC PEPTIDE  MAGNESIUM  TSH  T4, FREE  TROPONIN T, HIGH SENSITIVITY    EKG: EKG Interpretation Date/Time:  Wednesday July 06 2024 14:55:55 EDT Ventricular Rate:  51 PR Interval:  140 QRS Duration:  100 QT Interval:  462 QTC Calculation: 425 R Axis:   68  Text  Interpretation: Sinus bradycardia Otherwise normal ECG Compared with EKG from 12/14/2023 Confirmed by Gennaro Bouchard ([id number]) on 07/06/2024 3:52:26 PM  Radiology: [facility] Chest Port 1 View Result Date: 07/06/2024 CLINICAL DATA:  Chest pain. EXAM: PORTABLE CHEST 1 VIEW COMPARISON:  Chest radiograph dated 12/14/2023 FINDINGS: No focal consolidation, pleural effusion or pneumothorax. There is mild cardiomegaly with mild central vascular congestion. No acute osseous pathology. IMPRESSION: Mild cardiomegaly with mild central vascular congestion. Electronically Signed   By: Vanetta Chou M.D.   On: 07/06/2024 15:16    {Document cardiac monitor,  telemetry assessment procedure when appropriate:32947} Procedures   Medications Ordered in the ED  lactated ringers bolus 1,000 mL (1,000 mLs Intravenous New Bag/Given 07/06/24 1536)      {Click here for ABCD2, HEART and other calculators REFRESH Note before signing:1}                              Medical Decision Making Amount and/or Complexity of Data Reviewed Labs: ordered.   ***  {Document critical care time when appropriate  Document review of labs and clinical decision tools ie CHADS2VASC2, etc  Document your independent review of radiology images and any outside records  Document your discussion with family members, caretakers and with consultants  Document social determinants of health affecting pt's care  Document your decision making why or why not admission, treatments were needed:32947:::1}   Final diagnoses:  None    ED Discharge Orders     None

## 2024-07-06 NOTE — ED Triage Notes (Signed)
 Pt arrived via POV c/o hypotension and bradycardia at home today and reports she has not been feeling well for the past several weeks. Pt reports she called her PCP and was advised to go straight to the ER. Pt reports feeling lightheaded and dizzy as well.

## 2024-07-07 DIAGNOSIS — R001 Bradycardia, unspecified: Secondary | ICD-10-CM | POA: Diagnosis not present

## 2024-07-07 LAB — CBC
HCT: 39.5 % (ref 36.0–46.0)
Hemoglobin: 12.8 g/dL (ref 12.0–15.0)
MCH: 30.5 pg (ref 26.0–34.0)
MCHC: 32.4 g/dL (ref 30.0–36.0)
MCV: 94.3 fL (ref 80.0–100.0)
Platelets: 224 K/uL (ref 150–400)
RBC: 4.19 MIL/uL (ref 3.87–5.11)
RDW: 13 % (ref 11.5–15.5)
WBC: 8.1 K/uL (ref 4.0–10.5)
nRBC: 0 % (ref 0.0–0.2)

## 2024-07-07 LAB — BASIC METABOLIC PANEL WITH GFR
Anion gap: 8 (ref 5–15)
BUN: 14 mg/dL (ref 6–20)
CO2: 27 mmol/L (ref 22–32)
Calcium: 9.2 mg/dL (ref 8.9–10.3)
Chloride: 109 mmol/L (ref 98–111)
Creatinine, Ser: 1.14 mg/dL — ABNORMAL HIGH (ref 0.44–1.00)
GFR, Estimated: 56 mL/min — ABNORMAL LOW (ref >60)
Glucose, Bld: 106 mg/dL — ABNORMAL HIGH (ref 70–99)
Potassium: 4 mmol/L (ref 3.5–5.1)
Sodium: 144 mmol/L (ref 135–145)

## 2024-07-07 LAB — HIV ANTIBODY (ROUTINE TESTING W REFLEX): HIV Screen 4th Generation wRfx: NONREACTIVE

## 2024-07-07 MED ORDER — TIZANIDINE HCL 2 MG PO TABS: 2.0000 mg | ORAL_TABLET | Freq: Two times a day (BID) | ORAL | 0 refills | Status: DC | PRN

## 2024-07-07 MED ORDER — DONEPEZIL HCL 5 MG PO TABS: 5.0000 mg | ORAL_TABLET | Freq: Every day | ORAL | 0 refills | Status: DC

## 2024-07-07 MED ORDER — VITAMIN B-12 1000 MCG PO TABS: 1000.0000 ug | ORAL_TABLET | Freq: Every day | ORAL | 0 refills | Status: DC

## 2024-07-07 MED ORDER — MEMANTINE HCL 5 MG PO TABS: 5.0000 mg | ORAL_TABLET | Freq: Every day | ORAL | 0 refills | Status: DC

## 2024-07-07 MED ORDER — DULOXETINE HCL 30 MG PO CPEP: 30.0000 mg | ORAL_CAPSULE | Freq: Every morning | ORAL | 0 refills | Status: DC

## 2024-07-07 MED ORDER — PREGABALIN 50 MG PO CAPS: 50.0000 mg | ORAL_CAPSULE | Freq: Two times a day (BID) | ORAL | 0 refills | Status: DC

## 2024-07-07 MED ORDER — MELATONIN 3 MG PO TABS
6.0000 mg | ORAL_TABLET | Freq: Once | ORAL | Status: AC
  Administered 2024-07-07: 6 mg via ORAL
  Filled 2024-07-07: qty 2

## 2024-07-07 MED ORDER — CYANOCOBALAMIN 1000 MCG/ML IJ SOLN: INTRAMUSCULAR | 0 refills | Status: AC

## 2024-07-07 NOTE — Plan of Care (Signed)

## 2024-07-07 NOTE — Discharge Instructions (Signed)

## 2024-07-07 NOTE — Progress Notes (Signed)
  Transition of Care [facility]) Screening Note   Patient Details  Name: Kelly Hansen Date of Birth: 1965/09/25   Transition of Care [facility]) CM/SW Contact:    Hoy DELENA Bigness, LCSW Phone Number: 07/07/2024, 10:02 AM    Transition of Care Department [facility]) has reviewed patient and no TOC needs have been identified at this time. We will continue to monitor patient advancement through interdisciplinary progression rounds. If new patient transition needs arise, please place a TOC consult.    07/07/24 1001  TOC Brief Assessment  Insurance and Status Reviewed  Patient has primary care physician Yes (Local PCP list added to AVS)  Home environment has been reviewed Single family home  Prior level of function: Independent  Prior/Current Home Services No current home services  Social Drivers of Health Review SDOH reviewed no interventions necessary  Readmission risk has been reviewed Yes  Transition of care needs no transition of care needs at this time

## 2024-07-07 NOTE — Discharge Summary (Signed)
 Physician Discharge Summary  [name]:[id number] DOB: 1965/09/25 DOA: 07/06/2024  PCP: Gable Cambric, MD  Admit date: 07/06/2024  Discharge date: 07/07/2024  Admitted From:Home  Disposition:  Home  Recommendations for Outpatient Follow-up:  Follow up with PCP in 1-2 weeks Medication dosages reduced: Aricept , Namenda, tizanidine, Lyrica , and Cymbalta.  See below for details.  Hopefully this will help with issues with bradycardia and weakness. B12 prescribed and will need follow-up outpatient Continue other medications as previous and consider discontinuing certain medications over time due to polypharmacy  Home Health: None  Equipment/Devices: None  Discharge Condition:Stable  CODE STATUS: Full  Diet recommendation: Heart Healthy  Brief/Interim Summary: Kelly Hansen  is a 58 y.o. female, with past medical history of asthma, hypertension, patient is following with cardiology cardiac bradycardia and endocrinology guarding thyroid  nodule, and reports she was told by her cardiologist to her bradycardia and related to cardiac issue, reports generalized weakness, fatigue, she noted her blood pressure to be low and heart rate to be low today she called her PCP which instructed her to come to ED, patient reports generalized weakness and fatigue.  She was noted to be on multiple medications that would cause her to have some bradycardia as well as hypotension and these medications have been dose adjusted to 50% of the her usual dose and hopefully some of these can be discontinued in the near future.  Her bradycardia has resolved and her blood pressures are somewhat soft, however she is no longer symptomatic nor is she orthostatic.  She is eager for discharge and understands the need to take the new dosages of her medications to prevent any further issues with her hemodynamics.  She is to closely follow-up with her PCP and continue taking B12 as also prescribed to assist with her  symptoms.  Discharge Diagnoses:  Principal Problem:   Bradycardia  Principal discharge diagnosis: Symptomatic bradycardia likely secondary to polypharmacy.  B12 deficiency with likely associated neuropathy and memory trouble.  Discharge Instructions  Discharge Instructions     Diet - low sodium heart healthy   Complete by: As directed    Increase activity slowly   Complete by: As directed       Allergies as of 07/07/2024       Reactions   Sulfa Antibiotics Nausea And Vomiting, Rash   Penicillins Rash        Medication List     TAKE these medications    albuterol  108 (90 Base) MCG/ACT inhaler Commonly known as: VENTOLIN  HFA Inhale 2 puffs into the lungs 3 (three) times daily as needed for wheezing or shortness of breath.   cyanocobalamin 1000 MCG/ML injection Commonly known as: VITAMIN B12 Inject 1 mL (1,000 mcg total) into the muscle daily for 7 days, THEN 1 mL (1,000 mcg total) every 7 (seven) days for 28 days, THEN 1 mL (1,000 mcg total) every 30 (thirty) days. Start taking on: July 08, 2024   donepezil  5 MG tablet Commonly known as: ARICEPT  Take 1 tablet (5 mg total) by mouth at bedtime. What changed:  medication strength how much to take   DULoxetine 30 MG capsule Commonly known as: CYMBALTA Take 1 capsule (30 mg total) by mouth in the morning. Start taking on: July 08, 2024 What changed:  medication strength how much to take   Gemtesa  75 MG Tabs Generic drug: Vibegron  TAKE 1 TABLET BY MOUTH EVERY DAY   meclizine  12.5 MG tablet Commonly known as: ANTIVERT  Take 12.5 mg by mouth 2 (  two) times daily as needed for dizziness.   meloxicam 7.5 MG tablet Commonly known as: MOBIC Take 7.5 mg by mouth daily.   memantine 5 MG tablet Commonly known as: NAMENDA Take 1 tablet (5 mg total) by mouth daily. Start taking on: July 08, 2024 What changed:  medication strength how much to take when to take this   montelukast  10 MG tablet Commonly  known as: SINGULAIR  Take 1 tablet by mouth at bedtime.   nitrofurantoin  100 MG capsule Commonly known as: MACRODANTIN  Take 100 mg by mouth 2 (two) times daily.   nitroGLYCERIN  0.4 MG SL tablet Commonly known as: NITROSTAT  Take 1 (one) Tablet as needed for chest pain-If not better in 5 minutes repeat x 2--If still not better--seek attention at ER/ Call 911   olopatadine  0.1 % ophthalmic solution Commonly known as: PATANOL Place 1 drop into each eye as directed What changed:  how much to take how to take this when to take this reasons to take this   pantoprazole 40 MG tablet Commonly known as: PROTONIX Take 40 mg by mouth in the morning.   pregabalin  50 MG capsule Commonly known as: LYRICA  Take 1 capsule (50 mg total) by mouth 2 (two) times daily. What changed:  medication strength how much to take   rosuvastatin  10 MG tablet Commonly known as: CRESTOR  Take 10 mg by mouth at bedtime.   tiZANidine 2 MG tablet Commonly known as: ZANAFLEX Take 1 tablet (2 mg total) by mouth 2 (two) times daily as needed for muscle spasms. What changed:  medication strength how much to take when to take this reasons to take this   traZODone 50 MG tablet Commonly known as: DESYREL Take 50 mg by mouth at bedtime.   Trelegy Ellipta 100-62.5-25 MCG/ACT Aepb Generic drug: Fluticasone -Umeclidin-Vilant Inhale 1 puff into the lungs daily.   Vitamin D  (Ergocalciferol ) 1.25 MG (50000 UNIT) Caps capsule Commonly known as: DRISDOL  Take 1 capsule by mouth once a week. What changed:  how much to take how to take this when to take this        Follow-up Information     Gable Cambric, MD. Schedule an appointment as soon as possible for a visit in 1 week(s).   Specialty: Internal Medicine Contact information: [address] [place] [address] [phone]                Allergies  Allergen Reactions   Sulfa Antibiotics Nausea And Vomiting and Rash   Penicillins  Rash    Consultations: None   Procedures/Studies: DG Chest Port 1 View Result Date: 07/06/2024 CLINICAL DATA:  Chest pain. EXAM: PORTABLE CHEST 1 VIEW COMPARISON:  Chest radiograph dated 12/14/2023 FINDINGS: No focal consolidation, pleural effusion or pneumothorax. There is mild cardiomegaly with mild central vascular congestion. No acute osseous pathology. IMPRESSION: Mild cardiomegaly with mild central vascular congestion. Electronically Signed   By: Vanetta Chou M.D.   On: 07/06/2024 15:16     Discharge Exam: Vitals:   07/07/24 0823 07/07/24 0921  BP:    Pulse: 66   Resp: 15   Temp:  97.8 F (36.6 C)  SpO2: 95% 94%   Vitals:   07/07/24 0024 07/07/24 0429 07/07/24 0823 07/07/24 0921  BP: 113/63 (!) 98/51    Pulse: 63 66 66   Resp: 15 19 15    Temp: 98 F (36.7 C) 98 F (36.7 C)  97.8 F (36.6 C)  TempSrc:  Oral  Oral  SpO2: 93%  94% 95% 94%  Weight:      Height:        General: Pt is alert, awake, not in acute distress Cardiovascular: RRR, S1/S2 +, no rubs, no gallops Respiratory: CTA bilaterally, no wheezing, no rhonchi Abdominal: Soft, NT, ND, bowel sounds + Extremities: no edema, no cyanosis    The results of significant diagnostics from this hospitalization (including imaging, microbiology, ancillary and laboratory) are listed below for reference.     Microbiology: No results found for this or any previous visit (from the past 240 hours).   Labs: BNP (last 3 results) No results for input(s): BNP in the last 8760 hours. Basic Metabolic Panel: Recent Labs  Lab 07/06/24 1506 07/07/24 0411  NA 139 144  K 4.2 4.0  CL 105 109  CO2 25 27  GLUCOSE 90 106*  BUN 17 14  CREATININE 1.44* 1.14*  CALCIUM  9.6 9.2  MG 2.1  --    Liver Function Tests: Recent Labs  Lab 07/06/24 1506  AST 18  ALT 15  ALKPHOS 71  BILITOT 0.5  PROT 5.9*  ALBUMIN 3.9   No results for input(s): LIPASE, AMYLASE in the last 168 hours. No results for input(s):  AMMONIA in the last 168 hours. CBC: Recent Labs  Lab 07/06/24 1506 07/07/24 0411  WBC 8.8 8.1  NEUTROABS 4.8  --   HGB 13.8 12.8  HCT 42.6 39.5  MCV 95.1 94.3  PLT 265 224   Cardiac Enzymes: No results for input(s): CKTOTAL, CKMB, CKMBINDEX, TROPONINI in the last 168 hours. BNP: Invalid input(s): POCBNP CBG: No results for input(s): GLUCAP in the last 168 hours. D-Dimer No results for input(s): DDIMER in the last 72 hours. Hgb A1c No results for input(s): HGBA1C in the last 72 hours. Lipid Profile No results for input(s): CHOL, HDL, LDLCALC, TRIG, CHOLHDL, LDLDIRECT in the last 72 hours. Thyroid  function studies Recent Labs    07/06/24 1549  TSH 2.070   Anemia work up Recent Labs    07/06/24 1934  VITAMINB12 <150*   Urinalysis    Component Value Date/Time   COLORURINE YELLOW 10/04/2019 2115   APPEARANCEUR CLEAR 10/04/2019 2115   LABSPEC 1.026 10/04/2019 2115   PHURINE 6.0 10/04/2019 2115   GLUCOSEU NEGATIVE 10/04/2019 2115   HGBUR NEGATIVE 10/04/2019 2115   BILIRUBINUR Small 11/26/2023 0812   KETONESUR 20 (A) 10/04/2019 2115   PROTEINUR Positive (A) 11/26/2023 0812   PROTEINUR 30 (A) 10/04/2019 2115   UROBILINOGEN 1.0 11/26/2023 0812   UROBILINOGEN 0.2 04/02/2015 1900   NITRITE Negative 11/26/2023 0812   NITRITE NEGATIVE 10/04/2019 2115   LEUKOCYTESUR Negative 11/26/2023 0812   LEUKOCYTESUR NEGATIVE 10/04/2019 2115   Sepsis Labs Recent Labs  Lab 07/06/24 1506 07/07/24 0411  WBC 8.8 8.1   Microbiology No results found for this or any previous visit (from the past 240 hours).   Time coordinating discharge: 35 minutes  SIGNED:   Adron JONETTA Fairly, DO [facility] 07/07/2024, 11:28 AM  If 7PM-7AM, please contact night-coverage [email or web address]

## 2024-07-08 ENCOUNTER — Encounter: Payer: Self-pay | Admitting: Internal Medicine

## 2024-07-08 DIAGNOSIS — F32A Depression, unspecified: Secondary | ICD-10-CM | POA: Diagnosis not present

## 2024-07-08 DIAGNOSIS — E66812 Obesity, class 2: Secondary | ICD-10-CM | POA: Diagnosis not present

## 2024-07-08 DIAGNOSIS — Z79899 Other long term (current) drug therapy: Secondary | ICD-10-CM | POA: Diagnosis not present

## 2024-07-08 DIAGNOSIS — R5383 Other fatigue: Secondary | ICD-10-CM | POA: Diagnosis not present

## 2024-07-08 DIAGNOSIS — Z6837 Body mass index (BMI) 37.0-37.9, adult: Secondary | ICD-10-CM | POA: Diagnosis not present

## 2024-07-08 DIAGNOSIS — Z760 Encounter for issue of repeat prescription: Secondary | ICD-10-CM | POA: Diagnosis not present

## 2024-07-08 DIAGNOSIS — E539 Vitamin B deficiency, unspecified: Secondary | ICD-10-CM | POA: Diagnosis not present

## 2024-07-08 DIAGNOSIS — Z09 Encounter for follow-up examination after completed treatment for conditions other than malignant neoplasm: Secondary | ICD-10-CM | POA: Diagnosis not present

## 2024-07-08 DIAGNOSIS — R739 Hyperglycemia, unspecified: Secondary | ICD-10-CM | POA: Diagnosis not present

## 2024-07-08 NOTE — Telephone Encounter (Signed)
 Copied from CRM [id number]. Topic: Appointments - Scheduling Inquiry for Clinic >> Jul 08, 2024  8:07 AM Katheryn POUR wrote: Needs Dr Tobie approval.

## 2024-07-08 NOTE — Progress Notes (Signed)
 scheduled

## 2024-07-21 ENCOUNTER — Ambulatory Visit (INDEPENDENT_AMBULATORY_CARE_PROVIDER_SITE_OTHER): Admitting: Internal Medicine

## 2024-07-21 ENCOUNTER — Encounter: Payer: Self-pay | Admitting: Internal Medicine

## 2024-07-21 VITALS — BP 122/62 | HR 63 | Ht 62.0 in | Wt 198.6 lb

## 2024-07-21 DIAGNOSIS — K219 Gastro-esophageal reflux disease without esophagitis: Secondary | ICD-10-CM

## 2024-07-21 DIAGNOSIS — N3281 Overactive bladder: Secondary | ICD-10-CM

## 2024-07-21 DIAGNOSIS — G300 Alzheimer's disease with early onset: Secondary | ICD-10-CM | POA: Diagnosis not present

## 2024-07-21 DIAGNOSIS — E782 Mixed hyperlipidemia: Secondary | ICD-10-CM

## 2024-07-21 DIAGNOSIS — F02A18 Dementia in other diseases classified elsewhere, mild, with other behavioral disturbance: Secondary | ICD-10-CM | POA: Diagnosis not present

## 2024-07-21 DIAGNOSIS — I959 Hypotension, unspecified: Secondary | ICD-10-CM

## 2024-07-21 DIAGNOSIS — Z780 Asymptomatic menopausal state: Secondary | ICD-10-CM | POA: Diagnosis not present

## 2024-07-21 DIAGNOSIS — F0280 Dementia in other diseases classified elsewhere without behavioral disturbance: Secondary | ICD-10-CM | POA: Insufficient documentation

## 2024-07-21 DIAGNOSIS — Z1231 Encounter for screening mammogram for malignant neoplasm of breast: Secondary | ICD-10-CM

## 2024-07-21 DIAGNOSIS — E039 Hypothyroidism, unspecified: Secondary | ICD-10-CM | POA: Diagnosis not present

## 2024-07-21 DIAGNOSIS — F5101 Primary insomnia: Secondary | ICD-10-CM

## 2024-07-21 DIAGNOSIS — M797 Fibromyalgia: Secondary | ICD-10-CM

## 2024-07-21 DIAGNOSIS — J4540 Moderate persistent asthma, uncomplicated: Secondary | ICD-10-CM

## 2024-07-21 DIAGNOSIS — N1831 Chronic kidney disease, stage 3a: Secondary | ICD-10-CM

## 2024-07-21 MED ORDER — DULOXETINE HCL 30 MG PO CPEP: 30.0000 mg | ORAL_CAPSULE | Freq: Every morning | ORAL | 3 refills | Status: DC

## 2024-07-21 MED ORDER — MEMANTINE HCL 5 MG PO TABS: 5.0000 mg | ORAL_TABLET | Freq: Every day | ORAL | 3 refills | Status: AC

## 2024-07-21 MED ORDER — TRELEGY ELLIPTA 100-62.5-25 MCG/ACT IN AEPB: 1.0000 | INHALATION_SPRAY | Freq: Every day | RESPIRATORY_TRACT | 11 refills | Status: AC

## 2024-07-21 MED ORDER — PANTOPRAZOLE SODIUM 40 MG PO TBEC: 40.0000 mg | DELAYED_RELEASE_TABLET | Freq: Every morning | ORAL | 3 refills | Status: DC

## 2024-07-21 MED ORDER — MONTELUKAST SODIUM 10 MG PO TABS: 10.0000 mg | ORAL_TABLET | Freq: Every evening | ORAL | 3 refills | Status: AC

## 2024-07-21 MED ORDER — ROSUVASTATIN CALCIUM 10 MG PO TABS: 10.0000 mg | ORAL_TABLET | Freq: Every day | ORAL | 3 refills | Status: DC

## 2024-07-21 MED ORDER — TIZANIDINE HCL 4 MG PO TABS: 4.0000 mg | ORAL_TABLET | Freq: Two times a day (BID) | ORAL | 1 refills | Status: DC | PRN

## 2024-07-21 MED ORDER — TRAZODONE HCL 50 MG PO TABS: 50.0000 mg | ORAL_TABLET | Freq: Every day | ORAL | 1 refills | Status: AC

## 2024-07-21 NOTE — Assessment & Plan Note (Addendum)
 Has history of thyroid  nodules, has had biopsy - benign follicular nodule Was placed on levothyroxine in the past, but was recently discontinued by endocrinology in 08/25 Recent TSH and free T4 were WNL in 10/25

## 2024-07-21 NOTE — Assessment & Plan Note (Signed)
 Well-controlled with Trelegy and as needed albuterol  inhaler

## 2024-07-21 NOTE — Assessment & Plan Note (Signed)
 Last 2 CMP showed GFR around 55 Advised to maintain at least 64 ounces of fluid intake in a day Avoid nephrotoxic agents Check CBC and CMP after 3 months

## 2024-07-21 NOTE — Patient Instructions (Addendum)
 Please schedule bone density and mammogram.  Please stop taking Donepezil  for now.  Please continue to take medications as prescribed.  Please maintain at least 64 ounces of fluid intake in a day.

## 2024-07-21 NOTE — Assessment & Plan Note (Signed)
 Intermittent symptomatic hypotension, no obvious triggers Not on any antihypertensive Has been evaluated by cardiology Recently reduced doses of her medications due to bradycardia -she still has mild dizziness, advised to maintain at least 64 ounces of fluid intake in a day

## 2024-07-21 NOTE — Assessment & Plan Note (Signed)
 Has been evaluated by neurology and neuropsychology Was on donepezil  10 mg QD and memantine 10 mg BID Doses were recently reduced during hospitalization due to bradycardia DC donepezil  for now Continue memantine at 5 mg QD, can also help with headache

## 2024-07-21 NOTE — Progress Notes (Signed)
 New Patient Office Visit  Subjective:  Patient ID: Kelly Hansen, female    DOB: [birth year]  Age: 58 y.o. MRN: [id number]  CC:  Chief Complaint  Patient presents with   Establish Care    New patient establishing care.     HPI Kelly Hansen is a 58 y.o. female with past medical history of asthma, GERD, CKD, dementia, DDD of lumbar spine, overactive bladder, fibromyalgia and insomnia who presents for establishing care.  She was recently admitted at [facility] for hypotension and bradycardia with generalized weakness and fatigue from 07/06/24-07/07/24. She was noted to be on multiple medications that would cause her to have some bradycardia as well as hypotension and these medications have been dose adjusted to 50% of the her usual dose - namely Lyrica , donepezil , memantine.  Her bradycardia and hypotension have resolved now.  She had low vitamin B12, for which she has been prescribed vitamin B12 SQ.  She still reports mild dizziness, especially with standing up.  She admits that she needs to improve fluid intake.  Asthma: She uses Trelegy as maintenance inhaler and has albuterol  as rescue inhaler.  Denies any recent worsening of dyspnea or wheezing.  GERD: She takes pantoprazole 40 mg QD for it.  Denies nausea, vomiting currently.  Does not report abdominal pain, melena or hematochezia.  Dementia: She has been evaluated by neurology for early onset Alzheimer's dementia.  She was placed on donepezil  and memantine, but doses were recently reduced to donepezil  5 mg QD and memantine 5 mg QD.  She reports that her memory is better compared to prior, and does not agree with her diagnosis.  Fibromyalgia: Her dose of Cymbalta was recently reduced to 30 mg QD and Lyrica  to 50 mg twice daily.  She also takes trazodone 50 mg nightly for insomnia.  She has chronic fatigue and myalgias, but has not felt different since reducing the dose recently.  Overactive bladder: Followed by  urogynecology.  She takes Gemtesa  for it.    Past Medical History:  Diagnosis Date   Acid reflux    Angina pectoris 02/12/2021   Arthritis of multiple sites    Arthropathy of lumbar facet joint 06/17/2022   Asthma    Atherosclerosis of aorta    Chest pain of uncertain etiology 04/11/2021   Chronic insomnia 10/19/2023   CIN III with severe dysplasia 10/06/2016   Formatting of this note might be different from the original. Reports [facility] between 1991-2004 for abnormal Pap followed by Pap q61mos then q76mos She then had TVH in 2004 with Dr. Lewanda Needs Pap smears for 20-25y post-hysterectomy due to presumed h/o CIN 2-3   Class 3 severe obesity due to excess calories with serious comorbidity and body mass index (BMI) of 40.0 to 44.9 in adult [facility]) 10/19/2023   Degeneration of lumbar intervertebral disc 03/30/2020   Depression    Excessive daytime sleepiness 10/19/2023   Fibromyalgia    GERD (gastroesophageal reflux disease)    Hyperlipidemia    Hypotension 08/20/2023   Hypothyroid    Incontinence of feces with fecal urgency 09/04/2023   Increased body mass index 03/30/2020   Insomnia    Lumbar radiculopathy 06/24/2022   Lumbar spondylosis 06/17/2022   Mild CAD 04/11/2021   Morbid obesity (HCC) 02/12/2021   Nocturia more than twice per night 09/04/2023   OAB (overactive bladder) 09/04/2023   Orthopnea 10/19/2023   Pelvic floor dysfunction in female 09/04/2023   Peripheral neuropathy    Sleep apnea 10/19/2023  SUI (stress urinary incontinence, female) 09/04/2023   Thyroid  disease    Umbilical hernia    Vitamin D  deficiency     Past Surgical History:  Procedure Laterality Date   ABDOMINAL HYSTERECTOMY     APPENDECTOMY     BIOPSY THYROID   08/26/2023   LEFT HEART CATH AND CORONARY ANGIOGRAPHY N/A 02/13/2021   Procedure: LEFT HEART CATH AND CORONARY ANGIOGRAPHY;  Surgeon: Wonda Sharper, MD;  Location: [facility] INVASIVE CV LAB;  Service: Cardiovascular;  Laterality: N/A;     Family History  Problem Relation Age of Onset   Dementia Mother    Stroke Mother    Hypertension Mother    Asthma Mother    Hyperlipidemia Mother    Melanoma Mother    Diabetes Mother    Heart disease Mother    Kidney disease Mother    Skin cancer Father    Rectal cancer Father    Colon cancer Father    Dementia Sister    Asthma Sister    Cirrhosis Brother    Hypertension Brother    Hyperlipidemia Brother    Diabetes Brother    Heart disease Brother    Kidney disease Brother    Dementia Maternal Aunt    Dementia Maternal Uncle    Ovarian cancer Paternal Aunt    Thyroid  cancer Niece    Bladder Cancer Neg Hx    Uterine cancer Neg Hx    Kidney cancer Neg Hx     Social History   Socioeconomic History   Marital status: Married    Spouse name: Jerilee Space   Number of children: Not on file   Years of education: Not on file   Highest education level: 10th grade  Occupational History   Not on file  Tobacco Use   Smoking status: Former    Current packs/day: 0.00    Types: Cigarettes    Quit date: 03/10/2011    Years since quitting: 13.3   Smokeless tobacco: Never  Vaping Use   Vaping status: Never Used  Substance and Sexual Activity   Alcohol use: No   Drug use: No   Sexual activity: Yes  Other Topics Concern   Not on file  Social History Narrative   Right handed   Lives with family    One story home few steps to get into home    Social Drivers of Health   Financial Resource Strain: Low Risk  (07/18/2024)   Overall Financial Resource Strain (CARDIA)    Difficulty of Paying Living Expenses: Not hard at all  Food Insecurity: No Food Insecurity (07/18/2024)   Hunger Vital Sign    Worried About Running Out of Food in the Last Year: Never true    Ran Out of Food in the Last Year: Never true  Transportation Needs: Unknown (07/18/2024)   PRAPARE - Administrator, Civil Service (Medical): No    Lack of Transportation (Non-Medical): Not on file   Physical Activity: Inactive (07/18/2024)   Exercise Vital Sign    Days of Exercise per Week: 0 days    Minutes of Exercise per Session: Not on file  Stress: Stress Concern Present (07/18/2024)   [employer] of Occupational Health - Occupational Stress Questionnaire    Feeling of Stress: Very much  Social Connections: Unknown (07/18/2024)   Social Connection and Isolation Panel    Frequency of Communication with Friends and Family: More than three times a week    Frequency of Social Gatherings with Friends and  Family: Once a week    Attends Religious Services: Not on file    Active Member of Clubs or Organizations: No    Attends Banker Meetings: Not on file    Marital Status: Separated  Intimate Partner Violence: Not At Risk (07/06/2024)   Humiliation, Afraid, Rape, and Kick questionnaire    Fear of Current or Ex-Partner: No    Emotionally Abused: No    Physically Abused: No    Sexually Abused: No    ROS Review of Systems  Constitutional:  Negative for chills and fever.  HENT:  Negative for congestion, sinus pressure, sinus pain and sore throat.   Eyes:  Negative for pain and discharge.  Respiratory:  Negative for cough and shortness of breath.   Cardiovascular:  Negative for chest pain and palpitations.  Gastrointestinal:  Negative for abdominal pain, diarrhea, nausea and vomiting.  Endocrine: Negative for polydipsia and polyuria.  Genitourinary:  Negative for dysuria and hematuria.  Musculoskeletal:  Negative for neck pain and neck stiffness.  Skin:  Negative for rash.  Neurological:  Positive for dizziness. Negative for weakness.  Psychiatric/Behavioral:  Negative for agitation and behavioral problems.     Objective:   Today's Vitals: BP 122/62 (BP Location: Right Arm)   Pulse 63   Ht 5' 2 (1.575 m)   Wt 198 lb 9.6 oz (90.1 kg)   SpO2 96%   BMI 36.32 kg/m   Physical Exam Vitals reviewed.  Constitutional:      General: She is not in acute  distress.    Appearance: She is obese. She is not diaphoretic.  HENT:     Head: Normocephalic and atraumatic.     Nose: Nose normal.     Mouth/Throat:     Mouth: Mucous membranes are moist.  Eyes:     General: No scleral icterus.    Extraocular Movements: Extraocular movements intact.  Cardiovascular:     Rate and Rhythm: Normal rate and regular rhythm.     Heart sounds: Normal heart sounds. No murmur heard. Pulmonary:     Breath sounds: Normal breath sounds. No wheezing or rales.  Musculoskeletal:     Cervical back: Neck supple. No tenderness.     Right lower leg: No edema.     Left lower leg: No edema.  Skin:    General: Skin is warm.     Findings: No rash.  Neurological:     General: No focal deficit present.     Mental Status: She is alert and oriented to person, place, and time.  Psychiatric:        Mood and Affect: Mood is anxious.        Behavior: Behavior is cooperative.     Assessment & Plan:   Problem List Items Addressed This Visit       Cardiovascular and Mediastinum   Hypotension - Primary   Intermittent symptomatic hypotension, no obvious triggers Not on any antihypertensive Has been evaluated by cardiology Recently reduced doses of her medications due to bradycardia -she still has mild dizziness, advised to maintain at least 64 ounces of fluid intake in a day       Relevant Medications   rosuvastatin  (CRESTOR ) 10 MG tablet   Other Relevant Orders   CBC with Differential/Platelet   CMP14+EGFR     Respiratory   Asthma   Well-controlled with Trelegy and as needed albuterol  inhaler      Relevant Medications   Fluticasone -Umeclidin-Vilant (TRELEGY ELLIPTA) 100-62.5-25 MCG/ACT AEPB  montelukast  (SINGULAIR ) 10 MG tablet     Digestive   GERD (gastroesophageal reflux disease)   Well-controlled with pantoprazole 40 mg QD      Relevant Medications   pantoprazole (PROTONIX) 40 MG tablet     Endocrine   Hypothyroidism   Has history of thyroid   nodules, has had biopsy - benign follicular nodule Was placed on levothyroxine in the past, but was recently discontinued by endocrinology in 08/25 Recent TSH and free T4 were WNL in 10/25        Nervous and Auditory   Early onset Alzheimer's dementia, mild   Has been evaluated by neurology and neuropsychology Was on donepezil  10 mg QD and memantine 10 mg BID Doses were recently reduced during hospitalization due to bradycardia DC donepezil  for now Continue memantine at 5 mg QD, can also help with headache      Relevant Medications   DULoxetine (CYMBALTA) 30 MG capsule   memantine (NAMENDA) 5 MG tablet   traZODone (DESYREL) 50 MG tablet   tiZANidine (ZANAFLEX) 4 MG tablet     Genitourinary   OAB (overactive bladder)   On Gemtesa  Followed by urogynecology      Stage 3a chronic kidney disease (HCC)   Last 2 CMP showed GFR around 55 Advised to maintain at least 64 ounces of fluid intake in a day Avoid nephrotoxic agents Check CBC and CMP after 3 months        Other   Fibromyalgia   Chronic fatigue and myalgias likely due to fibromyalgia On Cymbalta 30 mg QD and Lyrica  50 mg BID, doses were recently reduced, will continue at lower doses for now due to recent episodes of bradycardia      Relevant Medications   DULoxetine (CYMBALTA) 30 MG capsule   traZODone (DESYREL) 50 MG tablet   tiZANidine (ZANAFLEX) 4 MG tablet   Hyperlipidemia   Continue Crestor  10 mg QD      Relevant Medications   rosuvastatin  (CRESTOR ) 10 MG tablet   Insomnia   Continue trazodone 50 mg qHS      Relevant Medications   traZODone (DESYREL) 50 MG tablet   Other Visit Diagnoses       Breast cancer screening by mammogram       Relevant Orders   MM 3D SCREENING MAMMOGRAM BILATERAL BREAST     Post-menopausal       Relevant Orders   DG Bone Density       Outpatient Encounter Medications as of 07/21/2024  Medication Sig   albuterol  (VENTOLIN  HFA) 108 (90 Base) MCG/ACT inhaler Inhale 2  puffs into the lungs 3 (three) times daily as needed for wheezing or shortness of breath.   cyanocobalamin (VITAMIN B12) 1000 MCG/ML injection Inject 1 mL (1,000 mcg total) into the muscle daily for 7 days, THEN 1 mL (1,000 mcg total) every 7 (seven) days for 28 days, THEN 1 mL (1,000 mcg total) every 30 (thirty) days.   ergocalciferol  (VITAMIN D2) 1.25 MG (50000 UT) capsule Take 1 capsule by mouth once a week. (Patient taking differently: Take 50,000 Units by mouth every 30 (thirty) days.)   GEMTESA  75 MG TABS TAKE 1 TABLET BY MOUTH EVERY DAY   meclizine  (ANTIVERT ) 12.5 MG tablet Take 12.5 mg by mouth 2 (two) times daily as needed for dizziness.   meloxicam (MOBIC) 7.5 MG tablet Take 7.5 mg by mouth daily.   nitroGLYCERIN  (NITROSTAT ) 0.4 MG SL tablet Take 1 (one) Tablet as needed for chest pain-If not better in 5  minutes repeat x 2--If still not better--seek attention at ER/ Call 911   olopatadine  (PATANOL) 0.1 % ophthalmic solution Place 1 drop into each eye as directed (Patient taking differently: Place 1 drop into both eyes daily as needed for allergies.)   pregabalin  (LYRICA ) 50 MG capsule Take 1 capsule (50 mg total) by mouth 2 (two) times daily.   [DISCONTINUED] donepezil  (ARICEPT ) 5 MG tablet Take 1 tablet (5 mg total) by mouth at bedtime.   [DISCONTINUED] DULoxetine (CYMBALTA) 30 MG capsule Take 1 capsule (30 mg total) by mouth in the morning.   [DISCONTINUED] Fluticasone -Umeclidin-Vilant (TRELEGY ELLIPTA) 100-62.5-25 MCG/ACT AEPB Inhale 1 puff into the lungs daily.   [DISCONTINUED] memantine (NAMENDA) 5 MG tablet Take 1 tablet (5 mg total) by mouth daily.   [DISCONTINUED] montelukast  (SINGULAIR ) 10 MG tablet Take 1 tablet by mouth at bedtime.   [DISCONTINUED] pantoprazole (PROTONIX) 40 MG tablet Take 40 mg by mouth in the morning.   [DISCONTINUED] rosuvastatin  (CRESTOR ) 10 MG tablet Take 10 mg by mouth at bedtime.   [DISCONTINUED] tiZANidine (ZANAFLEX) 2 MG tablet Take 1 tablet (2 mg total)  by mouth 2 (two) times daily as needed for muscle spasms.   [DISCONTINUED] traZODone (DESYREL) 50 MG tablet Take 50 mg by mouth at bedtime.   DULoxetine (CYMBALTA) 30 MG capsule Take 1 capsule (30 mg total) by mouth in the morning.   Fluticasone -Umeclidin-Vilant (TRELEGY ELLIPTA) 100-62.5-25 MCG/ACT AEPB Inhale 1 puff into the lungs daily.   memantine (NAMENDA) 5 MG tablet Take 1 tablet (5 mg total) by mouth daily.   montelukast  (SINGULAIR ) 10 MG tablet Take 1 tablet by mouth at bedtime.   pantoprazole (PROTONIX) 40 MG tablet Take 1 tablet (40 mg total) by mouth in the morning.   rosuvastatin  (CRESTOR ) 10 MG tablet Take 1 tablet (10 mg total) by mouth at bedtime.   tiZANidine (ZANAFLEX) 4 MG tablet Take 1 tablet (4 mg total) by mouth 2 (two) times daily as needed for muscle spasms.   traZODone (DESYREL) 50 MG tablet Take 1 tablet (50 mg total) by mouth at bedtime.   No facility-administered encounter medications on file as of 07/21/2024.    Follow-up: Return in about 3 months (around 10/21/2024) for Hypotension.   Suzzane MARLA Blanch, MD

## 2024-07-21 NOTE — Assessment & Plan Note (Signed)
 Chronic fatigue and myalgias likely due to fibromyalgia On Cymbalta 30 mg QD and Lyrica  50 mg BID, doses were recently reduced, will continue at lower doses for now due to recent episodes of bradycardia

## 2024-07-21 NOTE — Assessment & Plan Note (Signed)
 On Gemtesa  Followed by urogynecology

## 2024-07-21 NOTE — Assessment & Plan Note (Signed)
 Well controlled with pantoprazole 40 mg QD

## 2024-07-21 NOTE — Assessment & Plan Note (Signed)
 Continue Crestor 10 mg QD.

## 2024-07-21 NOTE — Assessment & Plan Note (Addendum)
Continue trazodone 50 mg qHS

## 2024-07-23 LAB — CBC WITH DIFFERENTIAL/PLATELET
Basophils Absolute: 0.1 x10E3/uL (ref 0.0–0.2)
Basos: 1 %
EOS (ABSOLUTE): 0.1 x10E3/uL (ref 0.0–0.4)
Eos: 2 %
Hematocrit: 46.4 % (ref 34.0–46.6)
Hemoglobin: 15 g/dL (ref 11.1–15.9)
Immature Grans (Abs): 0 x10E3/uL (ref 0.0–0.1)
Immature Granulocytes: 0 %
Lymphocytes Absolute: 3 x10E3/uL (ref 0.7–3.1)
Lymphs: 34 %
MCH: 30.9 pg (ref 26.6–33.0)
MCHC: 32.3 g/dL (ref 31.5–35.7)
MCV: 96 fL (ref 79–97)
Monocytes Absolute: 0.7 x10E3/uL (ref 0.1–0.9)
Monocytes: 8 %
Neutrophils Absolute: 5 x10E3/uL (ref 1.4–7.0)
Neutrophils: 55 %
Platelets: 330 x10E3/uL (ref 150–450)
RBC: 4.86 x10E6/uL (ref 3.77–5.28)
RDW: 12 % (ref 11.7–15.4)
WBC: 8.9 x10E3/uL (ref 3.4–10.8)

## 2024-07-23 LAB — CMP14+EGFR
ALT: 17 IU/L (ref 0–32)
AST: 19 IU/L (ref 0–40)
Albumin: 4.4 g/dL (ref 3.8–4.9)
Alkaline Phosphatase: 86 IU/L (ref 49–135)
BUN/Creatinine Ratio: 11 (ref 9–23)
BUN: 13 mg/dL (ref 6–24)
Bilirubin Total: 0.4 mg/dL (ref 0.0–1.2)
CO2: 23 mmol/L (ref 20–29)
Calcium: 10.5 mg/dL — ABNORMAL HIGH (ref 8.7–10.2)
Chloride: 101 mmol/L (ref 96–106)
Creatinine, Ser: 1.15 mg/dL — ABNORMAL HIGH (ref 0.57–1.00)
Globulin, Total: 2.1 g/dL (ref 1.5–4.5)
Glucose: 79 mg/dL (ref 70–99)
Potassium: 5.3 mmol/L — ABNORMAL HIGH (ref 3.5–5.2)
Sodium: 138 mmol/L (ref 134–144)
Total Protein: 6.5 g/dL (ref 6.0–8.5)
eGFR: 55 mL/min/1.73 — ABNORMAL LOW (ref >59)

## 2024-07-25 ENCOUNTER — Ambulatory Visit: Payer: Self-pay | Admitting: Internal Medicine

## 2024-07-27 ENCOUNTER — Ambulatory Visit: Admitting: Gastroenterology

## 2024-07-27 ENCOUNTER — Encounter: Payer: Self-pay | Admitting: Gastroenterology

## 2024-07-27 DIAGNOSIS — K219 Gastro-esophageal reflux disease without esophagitis: Secondary | ICD-10-CM

## 2024-07-27 MED ORDER — PANTOPRAZOLE SODIUM 40 MG PO TBEC: 40.0000 mg | DELAYED_RELEASE_TABLET | Freq: Every morning | ORAL | 3 refills | Status: AC

## 2024-07-27 NOTE — Progress Notes (Signed)
 Chief Complaint:   Referring Provider:  Gable Cambric, MD      ASSESSMENT AND PLAN;   #1. GERD/epi pain/H/O HP in past  #2. IBS/Microscopic colitis (better)  #3. FH CRC (dad at age 59). Next colon due 07/2026  Plan: -Protonix 40mg  po every day #90, 4RF -EGD -US  abdo -Rpt colon Nov 2027 -Minimize mobic. Try every other day. -If still with problems and above WU is neg, would proceed with CT scan abdo/pelvis.    HPI:    Kelly Hansen is a 58 y.o. female  With GERD, CKD3, HTN, HLD, asthma, fibromyalgia, osteoarthritis, low back pain, hypothyroidism with thyroid  nodule, anxiety, depression, obesity, OSA and several other problems as listed below History of Present Illness with multiple GI problems including  - Epigastric pain  - Reflux  - microscopic colitis   Here to get established since Dr. Towana has retired.  She experiences frequent diarrhea, occurring daily or every other day, with occasional constipation. There is no blood in her stool. Her last colonoscopy on July 17, 2021, was normal except for microscopic colitis found in biopsies. Previous colonoscopies in May 2020 and September 2015 were also normal, with biopsies showing lymphocytic colitis. She has been prescribed Uceris, which did not significantly improve her diarrhea.  She experiences occasional nausea without vomiting and very seldom experiences heartburn. She takes pantoprazole 40 mg once daily in the morning, which she finds helpful. There is no sensation of food getting stuck, but she occasionally feels it might happen.  Her medical history includes asthma, for which she uses inhalers, and she takes B12 shots weekly after initially taking them daily for a month due to very low B12 levels. She is on Cymbalta for anxiety and depression, vitamin D , Namenda for memory issues, nitroglycerine as needed, Lyrica  for fibromyalgia, Crestor  for cholesterol, and trazodone for sleep. She also takes meloxicam 7.5 mg  daily for arthritis, which she acknowledges causes some gastrointestinal issues.  She has a history of a thyroid  nodule. She also mentions having a belly button hernia that is bothersome. No epistaxis, gum bleeding, or vomiting. No significant abdominal pain reported, except for discomfort around her hernia. She denies being diabetic and mentions her creatinine levels have been slightly elevated, with a history of high calcium  levels being monitored by her doctor.  Family history is notable for her father having colon Ca at age 76.  From previous records - Chronic abdominal pain-better with dicyclomine 10 mg p.o. 3 times daily as needed. - Rectal bleeding due to hemorrhoids.  Better with Anusol HC suppositories - Has chronic esophageal dysmotility - Also has memory impairment. - Chronic diarrhea-combination of IBS/lymphocytic colitis.  Has been given a trial of Colazal, Uceris, Viberzi  Past GI workup:  Colonoscopy 07/17/2021 by Dr. Towana (FH CRC) - Normal colonoscopy to cecum. - Anal canal bleeding. - Random colon biopsies -negative for any abnormalities. - Repeat colonoscopy in 5 years.  Colonoscopy 01/28/2019, Dr. Towana - Normal colonoscopy to cecum. - Multiple random colon biopsies were consistent with lymphocytic colitis  Colonoscopy May 24, 2014: Normal to cecum.  Repeat in 5 years.  EGD May 24, 2014 by Dr. Towana: Normal EGD to the second portion of the duodenum.  Negative esophageal biopsies for EOE.  Biopsies were obtained from the upper esophagus and from the lower esophagus and sent in separate jars.  Had EGD several years prior that was diagnosed as having H. pylori gastritis.  Past Medical History:  Diagnosis Date  Acid reflux    Angina pectoris 02/12/2021   Arthritis of multiple sites    Arthropathy of lumbar facet joint 06/17/2022   Asthma    Atherosclerosis of aorta    Chest pain of uncertain etiology 04/11/2021   Chronic insomnia 10/19/2023   CIN  III with severe dysplasia 10/06/2016   Formatting of this note might be different from the original. Reports [facility] between 1991-2004 for abnormal Pap followed by Pap q59mos then q76mos She then had TVH in 2004 with Dr. Lewanda Needs Pap smears for 20-25y post-hysterectomy due to presumed h/o CIN 2-3   Class 3 severe obesity due to excess calories with serious comorbidity and body mass index (BMI) of 40.0 to 44.9 in adult [facility]) 10/19/2023   Degeneration of lumbar intervertebral disc 03/30/2020   Depression    Excessive daytime sleepiness 10/19/2023   Fibromyalgia    GERD (gastroesophageal reflux disease)    Hyperlipidemia    Hypotension 08/20/2023   Hypothyroid    Incontinence of feces with fecal urgency 09/04/2023   Increased body mass index 03/30/2020   Insomnia    Lumbar radiculopathy 06/24/2022   Lumbar spondylosis 06/17/2022   Mild CAD 04/11/2021   Morbid obesity (HCC) 02/12/2021   Nocturia more than twice per night 09/04/2023   OAB (overactive bladder) 09/04/2023   Orthopnea 10/19/2023   Pelvic floor dysfunction in female 09/04/2023   Peripheral neuropathy    Sleep apnea 10/19/2023   SUI (stress urinary incontinence, female) 09/04/2023   Thyroid  disease    UC (ulcerative colitis) (HCC)    Umbilical hernia    Vitamin D  deficiency     Past Surgical History:  Procedure Laterality Date   ABDOMINAL HYSTERECTOMY     left one ovary   APPENDECTOMY     BIOPSY THYROID   08/26/2023   COLONOSCOPY     LEFT HEART CATH AND CORONARY ANGIOGRAPHY N/A 02/13/2021   Procedure: LEFT HEART CATH AND CORONARY ANGIOGRAPHY;  Surgeon: Wonda Sharper, MD;  Location: [facility] INVASIVE CV LAB;  Service: Cardiovascular;  Laterality: N/A;    Family History  Problem Relation Age of Onset   Dementia Mother    Stroke Mother    Hypertension Mother    Asthma Mother    Hyperlipidemia Mother    Melanoma Mother    Diabetes Mother    Heart disease Mother    Kidney disease Mother    Skin cancer Father     Rectal cancer Father    Colon cancer Father    Dementia Sister    Asthma Sister    Cirrhosis Brother    Hypertension Brother    Hyperlipidemia Brother    Diabetes Brother    Heart disease Brother    Kidney disease Brother    Dementia Maternal Aunt    Dementia Maternal Uncle    Ovarian cancer Paternal Aunt    Thyroid  cancer Niece    Bladder Cancer Neg Hx    Uterine cancer Neg Hx    Kidney cancer Neg Hx     Social History   Tobacco Use   Smoking status: Former    Current packs/day: 0.00    Types: Cigarettes    Quit date: 03/10/2011    Years since quitting: 13.3   Smokeless tobacco: Never  Vaping Use   Vaping status: Never Used  Substance Use Topics   Alcohol use: No   Drug use: No    Current Outpatient Medications  Medication Sig Dispense Refill   albuterol  (VENTOLIN  HFA) 108 (90 Base)  MCG/ACT inhaler Inhale 2 puffs into the lungs 3 (three) times daily as needed for wheezing or shortness of breath.     cyanocobalamin (VITAMIN B12) 1000 MCG/ML injection Inject 1 mL (1,000 mcg total) into the muscle daily for 7 days, THEN 1 mL (1,000 mcg total) every 7 (seven) days for 28 days, THEN 1 mL (1,000 mcg total) every 30 (thirty) days. 15 mL 0   DULoxetine (CYMBALTA) 30 MG capsule Take 1 capsule (30 mg total) by mouth in the morning. 90 capsule 3   ergocalciferol  (VITAMIN D2) 1.25 MG (50000 UT) capsule Take 1 capsule by mouth once a week. (Patient taking differently: Take 50,000 Units by mouth every 30 (thirty) days.) 12 capsule 0   Fluticasone -Umeclidin-Vilant (TRELEGY ELLIPTA) 100-62.5-25 MCG/ACT AEPB Inhale 1 puff into the lungs daily. 60 each 11   GEMTESA  75 MG TABS TAKE 1 TABLET BY MOUTH EVERY DAY 90 tablet 1   meclizine  (ANTIVERT ) 12.5 MG tablet Take 12.5 mg by mouth 2 (two) times daily as needed for dizziness.     meloxicam (MOBIC) 7.5 MG tablet Take 7.5 mg by mouth daily.     memantine (NAMENDA) 5 MG tablet Take 1 tablet (5 mg total) by mouth daily. 90 tablet 3   montelukast   (SINGULAIR ) 10 MG tablet Take 1 tablet by mouth at bedtime. 90 tablet 3   nitroGLYCERIN  (NITROSTAT ) 0.4 MG SL tablet Take 1 (one) Tablet as needed for chest pain-If not better in 5 minutes repeat x 2--If still not better--seek attention at ER/ Call 911 25 tablet 0   olopatadine  (PATANOL) 0.1 % ophthalmic solution Place 1 drop into each eye as directed (Patient taking differently: Place 1 drop into both eyes daily as needed for allergies.) 10 mL 0   pantoprazole (PROTONIX) 40 MG tablet Take 1 tablet (40 mg total) by mouth in the morning. 90 tablet 3   pregabalin  (LYRICA ) 50 MG capsule Take 1 capsule (50 mg total) by mouth 2 (two) times daily. 60 capsule 0   rosuvastatin  (CRESTOR ) 10 MG tablet Take 1 tablet (10 mg total) by mouth at bedtime. 90 tablet 3   tiZANidine (ZANAFLEX) 4 MG tablet Take 1 tablet (4 mg total) by mouth 2 (two) times daily as needed for muscle spasms. 90 tablet 1   traZODone (DESYREL) 50 MG tablet Take 1 tablet (50 mg total) by mouth at bedtime. 90 tablet 1   No current facility-administered medications for this visit.    Allergies  Allergen Reactions   Sulfa Antibiotics Nausea And Vomiting and Rash   Penicillins Rash    Review of Systems:  Constitutional: Denies fever, chills, diaphoresis, appetite change and has fatigue.  HEENT: neg  Respiratory: Denies SOB, DOE, cough, chest tightness,  and wheezing.   Cardiovascular: Denies chest pain, palpitations and leg swelling.  Genitourinary: Denies dysuria, urgency, frequency, hematuria, flank pain and difficulty urinating.  Musculoskeletal: Has myalgias, back pain, joint swelling, arthralgias and gait problem.  Skin: has rash.  Neurological: Denies dizziness, seizures, syncope, weakness, light-headedness, numbness and headaches.  Hematological: Denies adenopathy. Easy bruising, personal or family bleeding history  Psychiatric/Behavioral: has anxiety or depression     Physical Exam:    BP 110/70   Pulse 70   Ht 5'  2.5 (1.588 m)   Wt 198 lb (89.8 kg)   BMI 35.64 kg/m  Wt Readings from Last 3 Encounters:  07/27/24 198 lb (89.8 kg)  07/21/24 198 lb 9.6 oz (90.1 kg)  07/06/24 200 lb 14.1 oz (91.1 kg)  Constitutional:  Well-developed, in no acute distress. Psychiatric: Normal mood and affect. Behavior is normal. HEENT: Pupils normal.  Conjunctivae are normal. No scleral icterus. Neck supple.  Cardiovascular: Normal rate, regular rhythm. No edema Pulmonary/chest: Effort normal and breath sounds normal. No wheezing, rales or rhonchi. Abdominal: Soft, nondistended. Nontender. Bowel sounds active throughout. There are no masses palpable. No hepatomegaly.  Has periumbilical hernia. Rectal: Deferred Neurological: Alert and oriented to person place and time. Skin: Skin is warm and dry. No rashes noted.  Data Reviewed: I have personally reviewed following labs and imaging studies  CBC:    Latest Ref Rng & Units 07/21/2024   11:50 AM 07/07/2024    4:11 AM 07/06/2024    3:06 PM  CBC  WBC 3.4 - 10.8 x10E3/uL 8.9  8.1  8.8   Hemoglobin 11.1 - 15.9 g/dL 84.9  87.1  86.1   Hematocrit 34.0 - 46.6 % 46.4  39.5  42.6   Platelets 150 - 450 x10E3/uL 330  224  265     CMP:    Latest Ref Rng & Units 07/21/2024   11:50 AM 07/07/2024    4:11 AM 07/06/2024    3:06 PM  CMP  Glucose 70 - 99 mg/dL 79  893  90   BUN 6 - 24 mg/dL 13  14  17    Creatinine 0.57 - 1.00 mg/dL 8.84  8.85  8.55   Sodium 134 - 144 mmol/L 138  144  139   Potassium 3.5 - 5.2 mmol/L 5.3  4.0  4.2   Chloride 96 - 106 mmol/L 101  109  105   CO2 20 - 29 mmol/L 23  27  25    Calcium  8.7 - 10.2 mg/dL 89.4  9.2  9.6   Total Protein 6.0 - 8.5 g/dL 6.5   5.9   Total Bilirubin 0.0 - 1.2 mg/dL 0.4   0.5   Alkaline Phos 49 - 135 IU/L 86   71   AST 0 - 40 IU/L 19   18   ALT 0 - 32 IU/L 17   15     GFR: Estimated Creatinine Clearance: 56.1 mL/min (A) (by C-G formula based on SCr of 1.15 mg/dL (H)). Liver Function Tests: Recent Labs  Lab  07/21/24 1150  AST 19  ALT 17  ALKPHOS 86  BILITOT 0.4  PROT 6.5  ALBUMIN 4.4      Radiology Studies: DG Chest Port 1 View Result Date: 07/06/2024 CLINICAL DATA:  Chest pain. EXAM: PORTABLE CHEST 1 VIEW COMPARISON:  Chest radiograph dated 12/14/2023 FINDINGS: No focal consolidation, pleural effusion or pneumothorax. There is mild cardiomegaly with mild central vascular congestion. No acute osseous pathology. IMPRESSION: Mild cardiomegaly with mild central vascular congestion. Electronically Signed   By: Vanetta Chou M.D.   On: 07/06/2024 15:16      Anselm Bring, MD 07/27/2024, 10:38 AM  Cc: Gable Cambric, MD

## 2024-07-27 NOTE — Patient Instructions (Signed)
 We have sent the following medications to your pharmacy for you to pick up at your convenience:  Protonix  You have been scheduled for an abdominal ultrasound at [facility] on 08/02/2024 at 7:30am. Please arrive 15 minutes prior to your appointment for registration. Make certain not to have anything to eat or drink after midnight prior to your appointment. Should you need to reschedule your appointment, please contact radiology at [phone]. This test typically takes about 30 minutes to perform.   You have been scheduled for an endoscopy. Please follow written instructions given to you at your visit today.  If you use inhalers (even only as needed), please bring them with you on the day of your procedure.  If you take any of the following medications, they will need to be adjusted prior to your procedure:   DO NOT TAKE 7 DAYS PRIOR TO TEST- Trulicity (dulaglutide) Ozempic, Wegovy (semaglutide) Mounjaro, Zepbound (tirzepatide) Bydureon Bcise (exanatide extended release)  DO NOT TAKE 1 DAY PRIOR TO YOUR TEST Rybelsus (semaglutide) Adlyxin (lixisenatide) Victoza (liraglutide) Byetta (exanatide) ___________________________________________________________________________

## 2024-07-28 ENCOUNTER — Ambulatory Visit: Admitting: Obstetrics

## 2024-07-28 ENCOUNTER — Encounter: Payer: Self-pay | Admitting: Obstetrics

## 2024-07-28 VITALS — BP 104/68 | HR 64

## 2024-07-28 DIAGNOSIS — N393 Stress incontinence (female) (male): Secondary | ICD-10-CM | POA: Diagnosis not present

## 2024-07-28 DIAGNOSIS — Z87440 Personal history of urinary (tract) infections: Secondary | ICD-10-CM | POA: Diagnosis not present

## 2024-07-28 DIAGNOSIS — N3281 Overactive bladder: Secondary | ICD-10-CM | POA: Diagnosis not present

## 2024-07-28 DIAGNOSIS — N390 Urinary tract infection, site not specified: Secondary | ICD-10-CM | POA: Diagnosis not present

## 2024-07-28 MED ORDER — GEMTESA 75 MG PO TABS
1.0000 | ORAL_TABLET | Freq: Every day | ORAL | 3 refills | Status: AC
Start: 2024-07-28 — End: ?

## 2024-07-28 MED ORDER — ESTRADIOL 0.01 % VA CREA: TOPICAL_CREAM | VAGINAL | 2 refills | Status: AC

## 2024-07-28 NOTE — Patient Instructions (Addendum)
 For vaginal atrophy (thinning of the vaginal tissue that can cause dryness and burning) and UTI prevention we discussed estrogen replacement in the form of vaginal cream.   Start vaginal estrogen therapy nightly for two weeks then 2 times weekly at night. This can be placed with your finger or an applicator inside the vagina and around the urethra.  Please let us  know if the prescription is too expensive and we can look for alternative options.   Is vaginal estrogen therapy safe for me? Vaginal estrogen preparations act on the vaginal skin, and only a very tiny amount is absorbed into the bloodstream (0.01%).  They work in a similar way to hand or face cream.  There is minimal absorption and they are therefore perfectly safe. If you have had breast cancer and have persistent troublesome symptoms which aren't settling with vaginal moisturisers and lubricants, local estrogen treatment may be a possibility, but consultation with your oncologist should take place first.   Please call if you experience any change in urinary of vaginal symptoms.   My office will contact you to schedule urethral bulking and botox  injection.

## 2024-07-28 NOTE — Assessment & Plan Note (Signed)
-   reports resolution of urinary leakage for 1 month after botox  injection and urethral bulking on 11/26/23 - started gemtesa  with improvement when symptoms returned. Desired to continue with Rx refilled - POCT UA + protein with Cr 1.11 on 02/12/21. Repeat Cr 1.15 on 07/21/24 - continue caffeine reduction due to recent exacerbation of urgency urinary leakage, timed voids, and bladder training - We discussed the symptoms of overactive bladder (OAB), which include urinary urgency, urinary frequency, nocturia, with or without urge incontinence.  While we do not know the exact etiology of OAB, several treatment options exist. We discussed management including behavioral therapy (decreasing bladder irritants, urge suppression strategies, timed voids, bladder retraining), physical therapy, medication; for refractory cases posterior tibial nerve stimulation, sacral neuromodulation, and intravesical botulinum toxin injection.  For anticholinergic medications, we discussed the potential side effects of anticholinergics including dry eyes, dry mouth, constipation, cognitive impairment and urinary retention. For Beta-3 agonist medication, we discussed the potential side effect of elevated blood pressure which is more likely to occur in individuals with uncontrolled hypertension. - For refractory OAB we reviewed the procedure for intravesical Botox  injection with cystoscopy in the office and reviewed the risks, benefits and alternatives of treatment including but not limited to infection, need for self-catheterization and need for repeat therapy.  We discussed that there is a 5-15% chance of needing to catheterize with Botox  and that this usually resolves in a few months; however can persist for longer periods of time.  Typically Botox  injections would need to be repeated every 3-12 months since this is not a permanent therapy.   We discussed the role of sacral neuromodulation and how it works. It requires a test phase,  and documentation of bladder function via diary. After a successful test period, a permanent wire and generator are placed in the OR. The battery lasts 5 years on average and would need to be replaced surgically.  The goal of this therapy is at least a 50% improvement in symptoms. It is NOT realistic to expect a 100% cure.  We reviewed the fact that about 30% of patients fail the test phase and are not candidates for permanent generator placement.  We discussed the risk of infection and that the patient would not be able to get an MRI once the device is placed. There are two companies that provide this therapy: [employer] and [employer]. [employer]' product is new and is similar to [employer]'s, but has advantages of a smaller and rechargeable battery and being able to have an MRI with the implant. For all procedures, we discussed risks of bleeding, infection, damage to surrounding organs including bowel, bladder, blood vessels, ureters and nerves, need for further surgery, risk of postoperative urinary incontinence or retention with need to catheterize, recurrent prolapse, numbness and weakness at any body site, buttock pain, and the rarer risks of blood clot, heart attack, pneumonia, death.    We also discussed the role of percutaneous tibial nerve stimulation and how it works.  She understands it requires 12 weekly visits for temporary neuromodulation of the sacral nerve roots via the tibial nerve and that she may then require continued tapered treatment.   - UDS 10/06/23 DOI with movement, normal PVR and sustained detrusor contraction during pressure flow.  - desires to consider repeat botox  injection, discussed risk of UTI

## 2024-07-28 NOTE — Progress Notes (Unsigned)
 [facility] Return Visit  SUBJECTIVE  History of Present Illness: Kelly Hansen is a 58 y.o. female seen in follow-up for botox  injection and urethral bulking on 11/26/23.    Started Gemtesa  with reduction of UUI to 1x/week down from 2-3x/day SUI with coughing 2-3x/day started 1 month ago.   Initial resolution of urinary symptoms since botox  injection and urethral bulking.   Reports increased coffee intake in the past week, 1 pot of coffee when she was at her sister's house.  Voids 10x/day decreased to 3x/day, 1x/night Reports leakage during intercourse with movement and during orgasm Denies UTI symptoms, dysuria, hematuria, increased frequency/urgency.  Reports UTI x 2 treated by PCP in the past month with dysuria, urethral burning and suprapubic pressure that resolves with voids. Denies changes in bowel movements.   Past Medical History: Patient  has a past medical history of Acid reflux, Angina pectoris (02/12/2021), Arthritis of multiple sites, Arthropathy of lumbar facet joint (06/17/2022), Asthma, Atherosclerosis of aorta, Chest pain of uncertain etiology (04/11/2021), Chronic insomnia (10/19/2023), CIN III with severe dysplasia (10/06/2016), Class 3 severe obesity due to excess calories with serious comorbidity and body mass index (BMI) of 40.0 to 44.9 in adult [facility]) (10/19/2023), Degeneration of lumbar intervertebral disc (03/30/2020), Depression, Excessive daytime sleepiness (10/19/2023), Fibromyalgia, GERD (gastroesophageal reflux disease), Hyperlipidemia, Hypotension (08/20/2023), Hypothyroid, Incontinence of feces with fecal urgency (09/04/2023), Increased body mass index (03/30/2020), Insomnia, Lumbar radiculopathy (06/24/2022), Lumbar spondylosis (06/17/2022), Mild CAD (04/11/2021), Morbid obesity (HCC) (02/12/2021), Nocturia more than twice per night (09/04/2023), OAB (overactive bladder) (09/04/2023), Orthopnea (10/19/2023), Pelvic floor dysfunction in female  (09/04/2023), Peripheral neuropathy, Sleep apnea (10/19/2023), SUI (stress urinary incontinence, female) (09/04/2023), Thyroid  disease, UC (ulcerative colitis) (HCC), Umbilical hernia, and Vitamin D  deficiency.   Past Surgical History: She  has a past surgical history that includes Abdominal hysterectomy; Appendectomy; LEFT HEART CATH AND CORONARY ANGIOGRAPHY (N/A, 02/13/2021); Biopsy thyroid  (08/26/2023); and Colonoscopy.   Medications: She has a current medication list which includes the following prescription(s): estradiol, albuterol , cyanocobalamin, duloxetine, ergocalciferol , trelegy ellipta, meclizine , meloxicam, memantine, montelukast , nitroglycerin , olopatadine , pantoprazole, pregabalin , rosuvastatin , tizanidine, trazodone, and gemtesa .   Allergies: Patient is allergic to sulfa antibiotics and penicillins.   Social History: Patient  reports that she quit smoking about 13 years ago. Her smoking use included cigarettes. She has never used smokeless tobacco. She reports that she does not drink alcohol and does not use drugs.     OBJECTIVE     Physical Exam: Vitals:   07/28/24 1540  BP: 104/68  Pulse: 64   Gen: No apparent distress, A&O x 3.  Detailed Urogynecologic Evaluation:  Deferred.   Lab Results  Component Value Date   CREATININE 1.15 (H) 07/21/2024    Post Void Residual - 07/28/24 1624       Post Void Residual   Post Void Residual 58 mL           ASSESSMENT AND PLAN    Kelly Hansen is a 58 y.o. with:  1. OAB (overactive bladder)   2. History of recurrent UTIs   3. SUI (stress urinary incontinence, female)      OAB (overactive bladder) Assessment & Plan: - reports resolution of urinary leakage for 1 month after botox  injection and urethral bulking on 11/26/23 - started gemtesa  with improvement when symptoms returned. Desired to continue with Rx refilled - POCT UA + protein with Cr 1.11 on 02/12/21. Repeat Cr 1.15 on 07/21/24 - continue caffeine  reduction due to recent exacerbation of urgency urinary  leakage, timed voids, and bladder training - We discussed the symptoms of overactive bladder (OAB), which include urinary urgency, urinary frequency, nocturia, with or without urge incontinence.  While we do not know the exact etiology of OAB, several treatment options exist. We discussed management including behavioral therapy (decreasing bladder irritants, urge suppression strategies, timed voids, bladder retraining), physical therapy, medication; for refractory cases posterior tibial nerve stimulation, sacral neuromodulation, and intravesical botulinum toxin injection.  For anticholinergic medications, we discussed the potential side effects of anticholinergics including dry eyes, dry mouth, constipation, cognitive impairment and urinary retention. For Beta-3 agonist medication, we discussed the potential side effect of elevated blood pressure which is more likely to occur in individuals with uncontrolled hypertension. - For refractory OAB we reviewed the procedure for intravesical Botox  injection with cystoscopy in the office and reviewed the risks, benefits and alternatives of treatment including but not limited to infection, need for self-catheterization and need for repeat therapy.  We discussed that there is a 5-15% chance of needing to catheterize with Botox  and that this usually resolves in a few months; however can persist for longer periods of time.  Typically Botox  injections would need to be repeated every 3-12 months since this is not a permanent therapy.   We discussed the role of sacral neuromodulation and how it works. It requires a test phase, and documentation of bladder function via diary. After a successful test period, a permanent wire and generator are placed in the OR. The battery lasts 5 years on average and would need to be replaced surgically.  The goal of this therapy is at least a 50% improvement in symptoms. It is NOT  realistic to expect a 100% cure.  We reviewed the fact that about 30% of patients fail the test phase and are not candidates for permanent generator placement.  We discussed the risk of infection and that the patient would not be able to get an MRI once the device is placed. There are two companies that provide this therapy: [employer] and [employer]. [employer]' product is new and is similar to [employer]'s, but has advantages of a smaller and rechargeable battery and being able to have an MRI with the implant. For all procedures, we discussed risks of bleeding, infection, damage to surrounding organs including bowel, bladder, blood vessels, ureters and nerves, need for further surgery, risk of postoperative urinary incontinence or retention with need to catheterize, recurrent prolapse, numbness and weakness at any body site, buttock pain, and the rarer risks of blood clot, heart attack, pneumonia, death.    We also discussed the role of percutaneous tibial nerve stimulation and how it works.  She understands it requires 12 weekly visits for temporary neuromodulation of the sacral nerve roots via the tibial nerve and that she may then require continued tapered treatment.   - UDS 10/06/23 DOI with movement, normal PVR and sustained detrusor contraction during pressure flow.  - desires to consider repeat botox  injection, discussed risk of UTI  Orders: -     Gemtesa ; Take 1 tablet (75 mg total) by mouth daily.  Dispense: 90 tablet; Refill: 3 -     [facility] Molecular Test; Future  History of recurrent UTIs Assessment & Plan: - For treatment of recurrent urinary tract infections, we discussed management of recurrent UTIs including prophylaxis with a daily low dose antibiotic, transvaginal estrogen therapy, D-mannose, and cranberry supplements.  We discussed the role of diagnostic testing such as cystoscopy and upper tract imaging.   - ROI from PCP  office for urine culture results - pending urine pathnostics from  urine today - start low dose vaginal estrogen - consider methenamine for suppression prior to repeat procedure. - PVR 58mL today  Orders: -     Estradiol; Place 0.5g nightly for two weeks then twice a week after  Dispense: 42.5 g; Refill: 2 -     [facility] Molecular Test; Future  SUI (stress urinary incontinence, female) Assessment & Plan: - leaks with bending over 2-3x/day - For treatment of stress urinary incontinence,  non-surgical options include expectant management, weight loss, physical therapy, as well as a pessary.  Surgical options include a midurethral sling, Burch urethropexy, and transurethral injection of a bulking agent. - reviewed midurethral sling, discussed urodynamics due to urgency dominant mixed UI - UDS 10/06/23 with + SUI and minimal urethral hypermobility. MUCP 50.   - continue to practice timed voids - initial resolution of leakage after botox  injection and urethral bulking 11/26/23. Now stress > urgency on gemtesa  - reviewed office procedure with urethral bulking (Bulkamid). We discussed success rate of approximately 70-80% and possible need for second injection. We reviewed that this is not a permanent procedure and the Bulkamid does dissolve over time. Risks reviewed including injury to bladder or urethra, UTI, urinary retention and hematuria.  - Sling: The effectiveness of a midurethral vaginal mesh sling is approximately 85%, and thus, there will be times when you may leak urine after surgery, especially if your bladder is full or if you have a strong cough. There is a balance between making the sling tight enough to treat your leakage but not too tight so that you have long-term difficulty emptying your bladder. A mesh sling will not directly treat overactive bladder/urge incontinence and may worsen it.  There is an FDA safety notification on vaginal mesh procedures for prolapse but NOT mesh slings. We have extensive experience and training with mesh placement and we  have close postoperative follow up to identify any potential complications from mesh. It is important to realize that this mesh is a permanent implant that cannot be easily removed. There are rare risks of mesh exposure (2-4%), pain with intercourse (0-7%), and infection (<1%). The risk of mesh exposure if more likely in a woman with risks for poor healing (prior radiation, poorly controlled diabetes, or immunocompromised). The risk of new or worsened chronic pain after mesh implant is more common in women with baseline chronic pain and/or poorly controlled anxiety or depression. Approximately 2-4% of patients will experience longer-term post-operative voiding dysfunction that may require surgical revision of the sling. We also reviewed that postoperatively, her stream may not be as strong as before surgery.  - continue efforts of weight reduction - patient desires to proceed with periurethral bulking  Orders: -     [facility] Molecular Test; Future   Time spent: I spent 29 minutes dedicated to the care of this patient on the date of this encounter to include pre-visit review of records, face-to-face time with the patient discussing overactive bladder, stress urinary incontinence, history of recurrent UTI, and post visit documentation.   Lianne ONEIDA Gillis, MD

## 2024-07-28 NOTE — Assessment & Plan Note (Addendum)
-   For treatment of recurrent urinary tract infections, we discussed management of recurrent UTIs including prophylaxis with a daily low dose antibiotic, transvaginal estrogen therapy, D-mannose, and cranberry supplements.  We discussed the role of diagnostic testing such as cystoscopy and upper tract imaging.   - ROI from PCP office for urine culture results - pending urine pathnostics from urine today - start low dose vaginal estrogen - consider methenamine for suppression prior to repeat procedure. - PVR 58mL today

## 2024-07-28 NOTE — Assessment & Plan Note (Signed)
-   leaks with bending over 2-3x/day - For treatment of stress urinary incontinence,  non-surgical options include expectant management, weight loss, physical therapy, as well as a pessary.  Surgical options include a midurethral sling, Burch urethropexy, and transurethral injection of a bulking agent. - reviewed midurethral sling, discussed urodynamics due to urgency dominant mixed UI - UDS 10/06/23 with + SUI and minimal urethral hypermobility. MUCP 50.   - continue to practice timed voids - initial resolution of leakage after botox  injection and urethral bulking 11/26/23. Now stress > urgency on gemtesa  - reviewed office procedure with urethral bulking (Bulkamid). We discussed success rate of approximately 70-80% and possible need for second injection. We reviewed that this is not a permanent procedure and the Bulkamid does dissolve over time. Risks reviewed including injury to bladder or urethra, UTI, urinary retention and hematuria.  - Sling: The effectiveness of a midurethral vaginal mesh sling is approximately 85%, and thus, there will be times when you may leak urine after surgery, especially if your bladder is full or if you have a strong cough. There is a balance between making the sling tight enough to treat your leakage but not too tight so that you have long-term difficulty emptying your bladder. A mesh sling will not directly treat overactive bladder/urge incontinence and may worsen it.  There is an FDA safety notification on vaginal mesh procedures for prolapse but NOT mesh slings. We have extensive experience and training with mesh placement and we have close postoperative follow up to identify any potential complications from mesh. It is important to realize that this mesh is a permanent implant that cannot be easily removed. There are rare risks of mesh exposure (2-4%), pain with intercourse (0-7%), and infection (<1%). The risk of mesh exposure if more likely in a woman with risks for poor  healing (prior radiation, poorly controlled diabetes, or immunocompromised). The risk of new or worsened chronic pain after mesh implant is more common in women with baseline chronic pain and/or poorly controlled anxiety or depression. Approximately 2-4% of patients will experience longer-term post-operative voiding dysfunction that may require surgical revision of the sling. We also reviewed that postoperatively, her stream may not be as strong as before surgery.  - continue efforts of weight reduction - patient desires to proceed with periurethral bulking

## 2024-08-02 ENCOUNTER — Ambulatory Visit (HOSPITAL_COMMUNITY)
Admission: RE | Admit: 2024-08-02 | Discharge: 2024-08-02 | Disposition: A | Discharge status: Home / Self Care | Source: Ambulatory Visit | Attending: Gastroenterology | Admitting: Gastroenterology

## 2024-08-02 ENCOUNTER — Other Ambulatory Visit: Payer: Self-pay | Admitting: Obstetrics and Gynecology

## 2024-08-02 DIAGNOSIS — K219 Gastro-esophageal reflux disease without esophagitis: Secondary | ICD-10-CM | POA: Diagnosis not present

## 2024-08-02 DIAGNOSIS — N390 Urinary tract infection, site not specified: Secondary | ICD-10-CM

## 2024-08-02 DIAGNOSIS — R109 Unspecified abdominal pain: Secondary | ICD-10-CM | POA: Diagnosis not present

## 2024-08-02 DIAGNOSIS — B9689 Other specified bacterial agents as the cause of diseases classified elsewhere: Secondary | ICD-10-CM

## 2024-08-02 MED ORDER — METRONIDAZOLE 0.75 % VA GEL: 1.0000 | Freq: Every day | VAGINAL | 0 refills | Status: AC

## 2024-08-02 MED ORDER — NITROFURANTOIN MONOHYD MACRO 100 MG PO CAPS: 100.0000 mg | ORAL_CAPSULE | Freq: Two times a day (BID) | ORAL | 0 refills | Status: AC

## 2024-08-02 NOTE — Progress Notes (Signed)
 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

## 2024-08-03 ENCOUNTER — Inpatient Hospital Stay
Admission: RE | Admit: 2024-08-03 | Discharge: 2024-08-03 | Disposition: A | Discharge status: Home / Self Care | Payer: Self-pay | Source: Ambulatory Visit | Attending: Internal Medicine | Admitting: Internal Medicine

## 2024-08-03 ENCOUNTER — Ambulatory Visit (HOSPITAL_COMMUNITY)
Admission: RE | Admit: 2024-08-03 | Discharge: 2024-08-03 | Disposition: A | Discharge status: Home / Self Care | Source: Ambulatory Visit | Attending: Internal Medicine | Admitting: Internal Medicine

## 2024-08-03 ENCOUNTER — Inpatient Hospital Stay
Admission: RE | Admit: 2024-08-03 | Discharge: 2024-08-03 | Disposition: A | Payer: Self-pay | Source: Ambulatory Visit | Attending: Internal Medicine

## 2024-08-03 ENCOUNTER — Other Ambulatory Visit: Payer: Self-pay | Admitting: Internal Medicine

## 2024-08-03 ENCOUNTER — Encounter (HOSPITAL_COMMUNITY): Payer: Self-pay

## 2024-08-03 DIAGNOSIS — Z1231 Encounter for screening mammogram for malignant neoplasm of breast: Secondary | ICD-10-CM

## 2024-08-03 DIAGNOSIS — M85851 Other specified disorders of bone density and structure, right thigh: Secondary | ICD-10-CM | POA: Diagnosis not present

## 2024-08-03 DIAGNOSIS — M85852 Other specified disorders of bone density and structure, left thigh: Secondary | ICD-10-CM | POA: Diagnosis not present

## 2024-08-03 DIAGNOSIS — Z780 Asymptomatic menopausal state: Secondary | ICD-10-CM | POA: Insufficient documentation

## 2024-08-06 ENCOUNTER — Ambulatory Visit: Payer: Self-pay | Admitting: Gastroenterology

## 2024-08-09 ENCOUNTER — Other Ambulatory Visit: Payer: Self-pay | Admitting: Internal Medicine

## 2024-08-09 DIAGNOSIS — M51362 Other intervertebral disc degeneration, lumbar region with discogenic back pain and lower extremity pain: Secondary | ICD-10-CM

## 2024-08-09 DIAGNOSIS — M797 Fibromyalgia: Secondary | ICD-10-CM

## 2024-08-09 MED ORDER — PREGABALIN 50 MG PO CAPS: 50.0000 mg | ORAL_CAPSULE | Freq: Two times a day (BID) | ORAL | 3 refills | Status: AC

## 2024-08-10 ENCOUNTER — Other Ambulatory Visit: Payer: Self-pay

## 2024-08-10 DIAGNOSIS — N3281 Overactive bladder: Secondary | ICD-10-CM

## 2024-08-10 DIAGNOSIS — N393 Stress incontinence (female) (male): Secondary | ICD-10-CM

## 2024-08-10 DIAGNOSIS — Z87440 Personal history of urinary (tract) infections: Secondary | ICD-10-CM

## 2024-08-10 LAB — PATHNOSTICS MOLECULAR TEST

## 2024-08-17 ENCOUNTER — Encounter: Payer: Self-pay | Admitting: Gastroenterology

## 2024-08-25 ENCOUNTER — Ambulatory Visit: Admitting: Gastroenterology

## 2024-08-25 ENCOUNTER — Encounter: Payer: Self-pay | Admitting: Gastroenterology

## 2024-08-25 VITALS — BP 101/62 | HR 72 | Temp 97.3°F | Resp 17 | Ht 62.5 in | Wt 198.0 lb

## 2024-08-25 DIAGNOSIS — K3189 Other diseases of stomach and duodenum: Secondary | ICD-10-CM | POA: Diagnosis not present

## 2024-08-25 DIAGNOSIS — K219 Gastro-esophageal reflux disease without esophagitis: Secondary | ICD-10-CM

## 2024-08-25 DIAGNOSIS — K319 Disease of stomach and duodenum, unspecified: Secondary | ICD-10-CM | POA: Diagnosis not present

## 2024-08-25 DIAGNOSIS — K317 Polyp of stomach and duodenum: Secondary | ICD-10-CM | POA: Diagnosis not present

## 2024-08-25 DIAGNOSIS — K2970 Gastritis, unspecified, without bleeding: Secondary | ICD-10-CM | POA: Diagnosis not present

## 2024-08-25 MED ORDER — SODIUM CHLORIDE 0.9 % IV SOLN: 500.0000 mL | INTRAVENOUS | Status: DC

## 2024-08-25 NOTE — Progress Notes (Signed)
 Sedate, gd SR, tolerated procedure well, VSS, report to RN

## 2024-08-25 NOTE — Progress Notes (Signed)
 Chief Complaint:   Referring Provider:  Tobie Suzzane POUR, MD      ASSESSMENT AND PLAN;   #1. GERD/epi pain/H/O HP in past  #2. IBS/Microscopic colitis (better)  #3. FH CRC (dad at age 59). Next colon due 07/2026  Plan: -Protonix  40mg  po every day #90, 4RF -EGD -US  abdo -Rpt colon Nov 2027 -Minimize mobic. Try every other day. -If still with problems and above WU is neg, would proceed with CT scan abdo/pelvis.    HPI:    Kelly Hansen is a 58 y.o. female  With GERD, CKD3, HTN, HLD, asthma, fibromyalgia, osteoarthritis, low back pain, hypothyroidism with thyroid  nodule, anxiety, depression, obesity, OSA and several other problems as listed below History of Present Illness with multiple GI problems including  - Epigastric pain  - Reflux  - microscopic colitis   Here to get established since Dr. Towana has retired.  She experiences frequent diarrhea, occurring daily or every other day, with occasional constipation. There is no blood in her stool. Her last colonoscopy on July 17, 2021, was normal except for microscopic colitis found in biopsies. Previous colonoscopies in May 2020 and September 2015 were also normal, with biopsies showing lymphocytic colitis. She has been prescribed Uceris , which did not significantly improve her diarrhea.  She experiences occasional nausea without vomiting and very seldom experiences heartburn. She takes pantoprazole  40 mg once daily in the morning, which she finds helpful. There is no sensation of food getting stuck, but she occasionally feels it might happen.  Her medical history includes asthma, for which she uses inhalers, and she takes B12 shots weekly after initially taking them daily for a month due to very low B12 levels. She is on Cymbalta  for anxiety and depression, vitamin D , Namenda  for memory issues, nitroglycerine as needed, Lyrica  for fibromyalgia, Crestor  for cholesterol, and trazodone  for sleep. She also takes meloxicam  7.5 mg daily for arthritis, which she acknowledges causes some gastrointestinal issues.  She has a history of a thyroid  nodule. She also mentions having a belly button hernia that is bothersome. No epistaxis, gum bleeding, or vomiting. No significant abdominal pain reported, except for discomfort around her hernia. She denies being diabetic and mentions her creatinine levels have been slightly elevated, with a history of high calcium  levels being monitored by her doctor.  Family history is notable for her father having colon Ca at age 77.  From previous records - Chronic abdominal pain-better with dicyclomine 10 mg p.o. 3 times daily as needed. - Rectal bleeding due to hemorrhoids.  Better with Anusol HC suppositories - Has chronic esophageal dysmotility - Also has memory impairment. - Chronic diarrhea-combination of IBS/lymphocytic colitis.  Has been given a trial of Colazal, Uceris , Viberzi  Past GI workup:  Colonoscopy 07/17/2021 by Dr. Towana (FH CRC) - Normal colonoscopy to cecum. - Anal canal bleeding. - Random colon biopsies -negative for any abnormalities. - Repeat colonoscopy in 5 years.  Colonoscopy 01/28/2019, Dr. Towana - Normal colonoscopy to cecum. - Multiple random colon biopsies were consistent with lymphocytic colitis  Colonoscopy May 24, 2014: Normal to cecum.  Repeat in 5 years.  EGD May 24, 2014 by Dr. Towana: Normal EGD to the second portion of the duodenum.  Negative esophageal biopsies for EOE.  Biopsies were obtained from the upper esophagus and from the lower esophagus and sent in separate jars.  Had EGD several years prior that was diagnosed as having H. pylori gastritis.  Past Medical History:  Diagnosis Date  Acid reflux    Angina pectoris 02/12/2021   Arthritis of multiple sites    Arthropathy of lumbar facet joint 06/17/2022   Asthma    Atherosclerosis of aorta    Chest pain of uncertain etiology 04/11/2021   Chronic insomnia 10/19/2023    CIN III with severe dysplasia 10/06/2016   Formatting of this note might be different from the original. Reports [facility] between 1991-2004 for abnormal Pap followed by Pap q18mos then q11mos She then had TVH in 2004 with Dr. Lewanda Needs Pap smears for 20-25y post-hysterectomy due to presumed h/o CIN 2-3   Class 3 severe obesity due to excess calories with serious comorbidity and body mass index (BMI) of 40.0 to 44.9 in adult [facility]) 10/19/2023   Degeneration of lumbar intervertebral disc 03/30/2020   Depression    Excessive daytime sleepiness 10/19/2023   Fibromyalgia    GERD (gastroesophageal reflux disease)    Hyperlipidemia    Hypotension 08/20/2023   Hypothyroid    Incontinence of feces with fecal urgency 09/04/2023   Increased body mass index 03/30/2020   Insomnia    Lumbar radiculopathy 06/24/2022   Lumbar spondylosis 06/17/2022   Mild CAD 04/11/2021   Morbid obesity (HCC) 02/12/2021   Nocturia more than twice per night 09/04/2023   OAB (overactive bladder) 09/04/2023   Orthopnea 10/19/2023   Pelvic floor dysfunction in female 09/04/2023   Peripheral neuropathy    Sleep apnea 10/19/2023   SUI (stress urinary incontinence, female) 09/04/2023   Thyroid  disease    UC (ulcerative colitis) (HCC)    Umbilical hernia    Vitamin D  deficiency     Past Surgical History:  Procedure Laterality Date   ABDOMINAL HYSTERECTOMY     left one ovary   APPENDECTOMY     BIOPSY THYROID   08/26/2023   COLONOSCOPY     LEFT HEART CATH AND CORONARY ANGIOGRAPHY N/A 02/13/2021   Procedure: LEFT HEART CATH AND CORONARY ANGIOGRAPHY;  Surgeon: Wonda Sharper, MD;  Location: [facility] INVASIVE CV LAB;  Service: Cardiovascular;  Laterality: N/A;    Family History  Problem Relation Age of Onset   Dementia Mother    Stroke Mother    Hypertension Mother    Asthma Mother    Hyperlipidemia Mother    Melanoma Mother    Diabetes Mother    Heart disease Mother    Kidney disease Mother    Skin cancer Father     Rectal cancer Father    Colon cancer Father    Dementia Sister    Asthma Sister    Cirrhosis Brother    Hypertension Brother    Hyperlipidemia Brother    Diabetes Brother    Heart disease Brother    Kidney disease Brother    Dementia Maternal Aunt    Dementia Maternal Uncle    Ovarian cancer Paternal Aunt    Breast cancer Cousin    Thyroid  cancer Niece    Bladder Cancer Neg Hx    Uterine cancer Neg Hx    Kidney cancer Neg Hx    Esophageal cancer Neg Hx    Stomach cancer Neg Hx     Social History   Tobacco Use   Smoking status: Former    Current packs/day: 0.00    Types: Cigarettes    Quit date: 03/10/2011    Years since quitting: 13.4   Smokeless tobacco: Never  Vaping Use   Vaping status: Never Used  Substance Use Topics   Alcohol use: No   Drug use:  No    Current Outpatient Medications  Medication Sig Dispense Refill   albuterol  (VENTOLIN  HFA) 108 (90 Base) MCG/ACT inhaler Inhale 2 puffs into the lungs 3 (three) times daily as needed for wheezing or shortness of breath.     DULoxetine  (CYMBALTA ) 30 MG capsule Take 1 capsule (30 mg total) by mouth in the morning. 90 capsule 3   ergocalciferol  (VITAMIN D2) 1.25 MG (50000 UT) capsule Take 1 capsule by mouth once a week. (Patient taking differently: Take 50,000 Units by mouth every 30 (thirty) days.) 12 capsule 0   estradiol  (ESTRACE ) 0.01 % CREA vaginal cream Place 0.5g nightly for two weeks then twice a week after 42.5 g 2   Fluticasone -Umeclidin-Vilant (TRELEGY ELLIPTA ) 100-62.5-25 MCG/ACT AEPB Inhale 1 puff into the lungs daily. 60 each 11   meclizine  (ANTIVERT ) 12.5 MG tablet Take 12.5 mg by mouth 2 (two) times daily as needed for dizziness.     meloxicam (MOBIC) 7.5 MG tablet Take 7.5 mg by mouth daily.     memantine  (NAMENDA ) 5 MG tablet Take 1 tablet (5 mg total) by mouth daily. 90 tablet 3   montelukast  (SINGULAIR ) 10 MG tablet Take 1 tablet by mouth at bedtime. 90 tablet 3   pregabalin  (LYRICA ) 50 MG capsule  Take 1 capsule (50 mg total) by mouth 2 (two) times daily. 60 capsule 3   rosuvastatin  (CRESTOR ) 10 MG tablet Take 1 tablet (10 mg total) by mouth at bedtime. 90 tablet 3   tiZANidine  (ZANAFLEX ) 4 MG tablet Take 1 tablet (4 mg total) by mouth 2 (two) times daily as needed for muscle spasms. 90 tablet 1   traZODone  (DESYREL ) 50 MG tablet Take 1 tablet (50 mg total) by mouth at bedtime. 90 tablet 1   Vibegron  (GEMTESA ) 75 MG TABS Take 1 tablet (75 mg total) by mouth daily. 90 tablet 3   cyanocobalamin  (VITAMIN B12) 1000 MCG/ML injection Inject 1 mL (1,000 mcg total) into the muscle daily for 7 days, THEN 1 mL (1,000 mcg total) every 7 (seven) days for 28 days, THEN 1 mL (1,000 mcg total) every 30 (thirty) days. 15 mL 0   metroNIDAZOLE  (METROGEL ) 0.75 % vaginal gel Place 1 Applicatorful vaginally at bedtime. Use for 5 nights. 70 g 0   nitroGLYCERIN  (NITROSTAT ) 0.4 MG SL tablet Take 1 (one) Tablet as needed for chest pain-If not better in 5 minutes repeat x 2--If still not better--seek attention at ER/ Call 911 25 tablet 0   olopatadine  (PATANOL) 0.1 % ophthalmic solution Place 1 drop into each eye as directed (Patient taking differently: Place 1 drop into both eyes daily as needed for allergies.) 10 mL 0   pantoprazole  (PROTONIX ) 40 MG tablet Take 1 tablet (40 mg total) by mouth in the morning. 90 tablet 3   Current Facility-Administered Medications  Medication Dose Route Frequency Provider Last Rate Last Admin   0.9 %  sodium chloride  infusion  500 mL Intravenous Continuous Charlanne Groom, MD        Allergies  Allergen Reactions   Sulfa Antibiotics Nausea And Vomiting and Rash   Penicillins Rash    Review of Systems:  Constitutional: Denies fever, chills, diaphoresis, appetite change and has fatigue.  HEENT: neg  Respiratory: Denies SOB, DOE, cough, chest tightness,  and wheezing.   Cardiovascular: Denies chest pain, palpitations and leg swelling.  Genitourinary: Denies dysuria, urgency,  frequency, hematuria, flank pain and difficulty urinating.  Musculoskeletal: Has myalgias, back pain, joint swelling, arthralgias and gait problem.  Skin:  has rash.  Neurological: Denies dizziness, seizures, syncope, weakness, light-headedness, numbness and headaches.  Hematological: Denies adenopathy. Easy bruising, personal or family bleeding history  Psychiatric/Behavioral: has anxiety or depression     Physical Exam:    BP (!) 148/70   Pulse 60   Temp (!) 97.3 F (36.3 C) (Temporal)   Ht 5' 2.5 (1.588 m)   Wt 198 lb (89.8 kg)   SpO2 95%   BMI 35.64 kg/m  Wt Readings from Last 3 Encounters:  08/25/24 198 lb (89.8 kg)  07/27/24 198 lb (89.8 kg)  07/21/24 198 lb 9.6 oz (90.1 kg)   Constitutional:  Well-developed, in no acute distress. Psychiatric: Normal mood and affect. Behavior is normal. HEENT: Pupils normal.  Conjunctivae are normal. No scleral icterus. Neck supple.  Cardiovascular: Normal rate, regular rhythm. No edema Pulmonary/chest: Effort normal and breath sounds normal. No wheezing, rales or rhonchi. Abdominal: Soft, nondistended. Nontender. Bowel sounds active throughout. There are no masses palpable. No hepatomegaly.  Has periumbilical hernia. Rectal: Deferred Neurological: Alert and oriented to person place and time. Skin: Skin is warm and dry. No rashes noted.  Data Reviewed: I have personally reviewed following labs and imaging studies  CBC:    Latest Ref Rng & Units 07/21/2024   11:50 AM 07/07/2024    4:11 AM 07/06/2024    3:06 PM  CBC  WBC 3.4 - 10.8 x10E3/uL 8.9  8.1  8.8   Hemoglobin 11.1 - 15.9 g/dL 84.9  87.1  86.1   Hematocrit 34.0 - 46.6 % 46.4  39.5  42.6   Platelets 150 - 450 x10E3/uL 330  224  265     CMP:    Latest Ref Rng & Units 07/21/2024   11:50 AM 07/07/2024    4:11 AM 07/06/2024    3:06 PM  CMP  Glucose 70 - 99 mg/dL 79  893  90   BUN 6 - 24 mg/dL 13  14  17    Creatinine 0.57 - 1.00 mg/dL 8.84  8.85  8.55   Sodium 134 - 144  mmol/L 138  144  139   Potassium 3.5 - 5.2 mmol/L 5.3  4.0  4.2   Chloride 96 - 106 mmol/L 101  109  105   CO2 20 - 29 mmol/L 23  27  25    Calcium  8.7 - 10.2 mg/dL 89.4  9.2  9.6   Total Protein 6.0 - 8.5 g/dL 6.5   5.9   Total Bilirubin 0.0 - 1.2 mg/dL 0.4   0.5   Alkaline Phos 49 - 135 IU/L 86   71   AST 0 - 40 IU/L 19   18   ALT 0 - 32 IU/L 17   15     GFR: CrCl cannot be calculated (Patient's most recent lab result is older than the maximum 21 days allowed.). Liver Function Tests: No results for input(s): AST, ALT, ALKPHOS, BILITOT, PROT, ALBUMIN in the last 168 hours.     Radiology Studies: MM 3D SCREENING MAMMOGRAM BILATERAL BREAST Result Date: 08/06/2024 CLINICAL DATA:  Screening. EXAM: DIGITAL SCREENING BILATERAL MAMMOGRAM WITH TOMOSYNTHESIS AND CAD TECHNIQUE: Bilateral screening digital craniocaudal and mediolateral oblique mammograms were obtained. Bilateral screening digital breast tomosynthesis was performed. The images were evaluated with computer-aided detection. COMPARISON:  Previous exam(s). ACR Breast Density Category b: There are scattered areas of fibroglandular density. FINDINGS: There are no findings suspicious for malignancy. IMPRESSION: No mammographic evidence of malignancy. A result letter of this screening mammogram will be mailed  directly to the patient. RECOMMENDATION: Screening mammogram in one year. (Code:SM-B-01Y) BI-RADS CATEGORY  1: Negative. Electronically Signed   By: Dina  Arceo M.D.   On: 08/06/2024 18:04   DG Bone Density Result Date: 08/03/2024 EXAM: DUAL X-RAY ABSORPTIOMETRY (DXA) FOR BONE MINERAL DENSITY 08/03/2024 7:53 am CLINICAL DATA:  58 year old Female Postmenopausal. Postmenopausal Patient is or has been on glucocorticoid therapy. TECHNIQUE: An axial (e.g., hips, spine) and/or appendicular (e.g., radius) exam was performed, as appropriate, using [employer] [employer] [facility]. Images are obtained for bone  mineral density measurement and are not obtained for diagnostic purposes. [id number] Exclusions: None. COMPARISON:  None. FINDINGS: Scan quality: Good. LUMBAR SPINE (L1-L4): BMD (in g/cm2): 1.081 T-score: -0.8 Z-score: 0.3 LEFT FEMORAL NECK: BMD (in g/cm2): 0.861 T-score: -1.3 Z-score: -0.1 LEFT TOTAL HIP: BMD (in g/cm2): 0.974 T-score: -0.3 Z-score: 0.6 RIGHT FEMORAL NECK: BMD (in g/cm2): 0.872 T-score: -1.2 Z-score: 0.0 RIGHT TOTAL HIP: BMD (in g/cm2): 0.925 T-score: -0.7 Z-score: 0.2 FRAX 10-YEAR PROBABILITY OF FRACTURE: 10-year fracture risk is performed using the [employer] FRAX calculator based on patient-reported risk factors. Major osteoporotic fracture: 6.7% Hip fracture: 0.4% Other situations known to alter the reliability of the FRAX score should be considered when making treatment decisions, including chronic glucocorticoid use and past treatments. Further guidance on treatment can be found at the [facility] Osteoporosis Foundation's website https://www.patton.com/. IMPRESSION: Osteopenia based on BMD. Fracture risk is increased. Increased risk is based on low BMD. RECOMMENDATIONS: 1. All patients should optimize calcium  and vitamin D  intake. 2. Consider FDA-approved medical therapies in postmenopausal women and men aged 43 years and older, based on the following: - A hip or vertebral (clinical or morphometric) fracture - T-score less than or equal to -2.5 and secondary causes have been excluded. - Low bone mass (T-score between -1.0 and -2.5) and a 10-year probability of a hip fracture greater than or equal to 3% or a 10-year probability of a major osteoporosis-related fracture greater than or equal to 20% based on the [place] -adapted WHO algorithm. - Clinician judgment and/or patient preferences may indicate treatment for people with 10-year fracture probabilities above or below these levels 3. Patients with diagnosis of osteoporosis or at high risk for fracture should have regular bone mineral density  tests. For patients eligible for Medicare, routine testing is allowed once every 2 years. The testing frequency can be increased to one year for patients who have rapidly progressing disease, those who are receiving or discontinuing medical therapy to restore bone mass, or have additional risk factors. Electronically Signed   By: Dina  Arceo M.D.   On: 08/03/2024 09:08   US  Abdomen Complete Result Date: 08/02/2024 CLINICAL DATA:  abdominal pain EXAM: ABDOMEN ULTRASOUND COMPLETE COMPARISON:  October 22, 2013 FINDINGS: Gallbladder: No gallstones or wall thickening visualized. No sonographic Murphy sign noted by sonographer. Common bile duct: Diameter: Visualized portion measures 3 mm, within normal limits. Liver: No focal lesion identified. Diffusely mildly increased parenchymal echogenicity. Portal vein is patent on color Doppler imaging with normal direction of blood flow towards the liver. IVC: No abnormality visualized. Pancreas: Visualized portion unremarkable. Spleen: Size and appearance within normal limits. Right Kidney: Length: 9.0 cm. Echogenicity within normal limits. No mass or hydronephrosis visualized. Left Kidney: Length: 10.9 cm. Echogenicity within normal limits. No mass or hydronephrosis visualized. Abdominal aorta: No aneurysm visualized. Other findings: None. IMPRESSION: 1. No sonographic etiology for abdominal pain identified. 2. Mildly increased hepatic echogenicity as can be seen in hepatic  steatosis. Electronically Signed   By: Corean Salter M.D.   On: 08/02/2024 13:12      Anselm Bring, MD 08/25/2024, 9:41 AM  Cc: Tobie Suzzane POUR, MD

## 2024-08-25 NOTE — Progress Notes (Signed)
 Called to room to assist during endoscopic procedure.  Patient ID and intended procedure confirmed with present staff. Received instructions for my participation in the procedure from the performing physician.

## 2024-08-25 NOTE — Op Note (Signed)
 [facility] Patient Name: Kelly Hansen Procedure Date: 08/25/2024 9:45 AM MRN: [id number] Endoscopist: Lynnie Bring , MD, [id number] Age: 58 Referring MD:  Date of Birth: Feb 10, 1966 Gender: Female Account #: [id number] Procedure:                Upper GI endoscopy Indications:              Epigastric abdominal pain with neg US  Medicines:                Monitored Anesthesia Care Procedure:                Pre-Anesthesia Assessment:                           - Prior to the procedure, a History and Physical                            was performed, and patient medications and                            allergies were reviewed. The patient's tolerance of                            previous anesthesia was also reviewed. The risks                            and benefits of the procedure and the sedation                            options and risks were discussed with the patient.                            All questions were answered, and informed consent                            was obtained. Prior Anticoagulants: The patient has                            taken no anticoagulant or antiplatelet agents. ASA                            Grade Assessment: II - A patient with mild systemic                            disease. After reviewing the risks and benefits,                            the patient was deemed in satisfactory condition to                            undergo the procedure.                           After obtaining informed consent, the endoscope was  passed under direct vision. Throughout the                            procedure, the patient's blood pressure, pulse, and                            oxygen saturations were monitored continuously. The                            GIF [id number] #[id number] was introduced through the                            mouth, and advanced to the second part of duodenum.                            The upper GI  endoscopy was accomplished without                            difficulty. The patient tolerated the procedure                            well. Scope In: Scope Out: Findings:                 The examined esophagus was normal.                           The Z-line was regular and was found 38 cm from the                            incisors.                           Localized minimal inflammation characterized by                            congestion (edema) and erythema was found in the                            gastric antrum. Biopsies were taken with a cold                            forceps for histology.                           A single 4 mm sessile polyp with no stigmata of                            recent bleeding was found in the gastric body. The                            polyp was removed with a cold biopsy forceps.                            Resection and retrieval were complete.  The examined duodenum was normal. Biopsies for                            histology were taken with a cold forceps for                            evaluation of celiac disease. Complications:            No immediate complications. Estimated Blood Loss:     Estimated blood loss: none. Impression:               - Normal esophagus.                           - Z-line regular, 38 cm from the incisors.                           - Gastritis. Biopsied.                           - A single gastric polyp. Resected and retrieved.                           - Normal examined duodenum. Biopsied. Recommendation:           - Patient has a contact number available for                            emergencies. The signs and symptoms of potential                            delayed complications were discussed with the                            patient. Return to normal activities tomorrow.                            Written discharge instructions were provided to the                             patient.                           - Resume previous diet.                           - Continue present medications including Protonix                             40 mg p.o. daily.                           - Await pathology results.                           - The findings and recommendations were discussed  with the patient's family. Lynnie Bring, MD 08/25/2024 9:57:05 AM This report has been signed electronically.

## 2024-08-25 NOTE — Progress Notes (Signed)
 Pt's states no medical or surgical changes since previsit or office visit.

## 2024-08-25 NOTE — Patient Instructions (Signed)

## 2024-08-26 ENCOUNTER — Telehealth: Payer: Self-pay

## 2024-08-26 NOTE — Telephone Encounter (Signed)
°  Follow up Call-     08/25/2024    9:08 AM  Call back number  Post procedure Call Back phone  # [phone]  Permission to leave phone message Yes     Patient questions:  Do you have a fever, pain , or abdominal swelling? No. Pain Score  0 *  Have you tolerated food without any problems? Yes.    Have you been able to return to your normal activities? Yes.    Do you have any questions about your discharge instructions: Diet   No. Medications  No. Follow up visit  No.  Do you have questions or concerns about your Care? No.  Actions: * If pain score is 4 or above: No action needed, pain <4.

## 2024-08-29 LAB — SURGICAL PATHOLOGY

## 2024-08-30 ENCOUNTER — Ambulatory Visit: Payer: Self-pay | Admitting: Gastroenterology

## 2024-09-13 ENCOUNTER — Other Ambulatory Visit: Payer: Self-pay | Admitting: Internal Medicine

## 2024-09-13 ENCOUNTER — Ambulatory Visit: Payer: Self-pay | Admitting: Obstetrics

## 2024-09-13 DIAGNOSIS — E782 Mixed hyperlipidemia: Secondary | ICD-10-CM

## 2024-09-13 DIAGNOSIS — M797 Fibromyalgia: Secondary | ICD-10-CM

## 2024-09-13 DIAGNOSIS — E559 Vitamin D deficiency, unspecified: Secondary | ICD-10-CM

## 2024-09-13 MED ORDER — ROSUVASTATIN CALCIUM 10 MG PO TABS: 10.0000 mg | ORAL_TABLET | Freq: Every day | ORAL | 3 refills | Status: AC

## 2024-09-13 MED ORDER — ERGOCALCIFEROL 1.25 MG (50000 UT) PO CAPS: ORAL_CAPSULE | ORAL | 0 refills | Status: AC

## 2024-09-13 MED ORDER — TIZANIDINE HCL 4 MG PO TABS: 4.0000 mg | ORAL_TABLET | Freq: Two times a day (BID) | ORAL | 1 refills | Status: AC | PRN

## 2024-09-13 NOTE — Telephone Encounter (Signed)
 Unable to pend Donepezil 

## 2024-09-13 NOTE — Telephone Encounter (Signed)
 Copied from CRM #[id number]. Topic: Clinical - Medication Refill >> Sep 13, 2024  9:17 AM Tobias L wrote: Medication: ergocalciferol  (VITAMIN D2) 1.25 MG (50000 UT) capsule [[id number]] Pharmacy informed patient they had no refills on medications. Requesting Dr. Tobie to takeover vitamin d2.   rosuvastatin  (CRESTOR ) 10 MG tablet tiZANidine  (ZANAFLEX ) 4 MG table donepezil  (ARICEPT ) 5 MG tablet   Has the patient contacted their pharmacy? Yes Told to reach out to office.   This is the patient's preferred pharmacy:  [facility] [address], [place] - [address] [place] [address] [place] [address] Phone: [phone] Fax: [phone]  Is this the correct pharmacy for this prescription? Yes If no, delete pharmacy and type the correct one.   Has the prescription been filled recently? No  Is the patient out of the medication? Yes  Has the patient been seen for an appointment in the last year OR does the patient have an upcoming appointment? Yes  Can we respond through MyChart? Yes  Agent: Please be advised that Rx refills may take up to 3 business days. We ask that you follow-up with your pharmacy.

## 2024-09-28 ENCOUNTER — Ambulatory Visit: Payer: Self-pay

## 2024-09-28 ENCOUNTER — Telehealth: Payer: Self-pay | Admitting: Internal Medicine

## 2024-09-28 ENCOUNTER — Other Ambulatory Visit: Payer: Self-pay | Admitting: Internal Medicine

## 2024-09-28 DIAGNOSIS — M797 Fibromyalgia: Secondary | ICD-10-CM

## 2024-09-28 MED ORDER — DULOXETINE HCL 60 MG PO CPEP: 60.0000 mg | ORAL_CAPSULE | Freq: Every morning | ORAL | 3 refills | Status: AC

## 2024-09-28 NOTE — Telephone Encounter (Signed)
 FYI Only or Action Required?: Action required by provider: update on patient condition and advise on medication adjustments.  Patient was last seen in primary care on 07/21/2024 by Kelly Suzzane POUR, MD.  Called Nurse Triage reporting Depression.  Symptoms began several weeks ago.  Interventions attempted: Rest, hydration, or home remedies.  Symptoms are: gradually worsening.  Triage Disposition: See PCP When Office is Open (Within 3 Days)  Patient/caregiver understands and will follow disposition?: No, wishes to speak with PCP  Copied from CRM [id number]. Topic: Clinical - Red Word Triage >> Sep 28, 2024  2:00 PM Rosaria BRAVO wrote: Red Word that prompted transfer to Nurse Triage: Depression medication changes have made her depression symptoms worse.   Also mentions that she needs her memory medication reinstated, she is experiencing memory issues since she has stopped taking her medication. Reason for Disposition  [1] New or changed anti-depressant medication > 2 weeks ago AND [2] not feeling any better  Answer Assessment - Initial Assessment Questions Patient was decreased on her Duloxetine  at [facility]- she finished out her prescription of 60mg  prior to decreasing about a month ago.   Since decreasing down- patient has lost all motivation to do anything. She was going to the gym frequently and now hardly has motivation to get off her cough. She feels like crying all the time and states she feels Empty.   Her memory medication was also decreased at LOV. States that she didn't think it was helping at 10mg  BID so dropped to 5mg  daily. Since- her short term memory has suffered. Her Boyfriend has to repeat himself frequently throughout the day bc she forgets what he said. Brain fog. Denies any acute confusion or altered mental status.   Denies thoughts of hurting self or others, no substance use. Just no motivation and really wants to get back to feeling normal. Discussed 988* and therapy- doesn't  believe she can afford therapist at this time  Patient asking if she can go back up on both of her prescriptions. Was on Cymbalta  60mg  Daily and memantine  10mg  BID. Pharmacy on file confirmed. Please advise and send scripts for increasing dose back up if indicated   1. CONCERN: What happened that made you call today?     Worsening depression  2. DEPRESSION SYMPTOM SCREENING: How are you feeling overall? (e.g., decreased energy, increased sleeping or difficulty sleeping, difficulty concentrating, feelings of sadness, guilt, hopelessness, or worthlessness)     Crying all the time, brain fog, no desire to do anything, sits on the couch wrapped in a blanket and just thinks  3. RISK OF HARM - SUICIDAL IDEATION:  Do you ever have thoughts of hurting or killing yourself?  (e.g., yes, no, no but preoccupation with thoughts about death)     denies 4. RISK OF HARM - HOMICIDAL IDEATION:  Do you ever have thoughts of hurting or killing someone else?  (e.g., yes, no, no but preoccupation with thoughts about death)     denies 5. FUNCTIONAL IMPAIRMENT: How have things been going for you overall? Have you had more difficulty than usual doing your normal daily activities?  (e.g., better, same, worse; self-care, school, work, interactions)     Feels empty, wants to cry all the time  6. SUPPORT: Who is with you now? Who do you live with? Do you have family or friends who you can talk to?      Has good support system 7. THERAPIST: Do you have a counselor or therapist? If Yes, ask:  What is their name?     Unsure if can afford 8. STRESSORS: Has there been any new stress or recent changes in your life?     Decreasing medications 9. ALCOHOL USE OR SUBSTANCE USE (DRUG USE): Do you drink alcohol or use any illegal drugs?     No never 10. OTHER: Do you have any other physical symptoms right now? (e.g., fever)      Short term memory issues  Protocols used: Depression-A-AH

## 2024-09-28 NOTE — Telephone Encounter (Unsigned)
 Copied from CRM [id number]. Topic: Appointments - Appointment Scheduling >> Sep 28, 2024  2:39 PM Delon DASEN wrote: Patient was told to follow up with Dr Tobie in 3 days for depression but he is not available until March- she does have an appt in February- [phone]

## 2024-09-29 NOTE — Telephone Encounter (Signed)
 Pt has been informed

## 2024-10-26 ENCOUNTER — Ambulatory Visit: Admitting: Internal Medicine

## 2024-10-28 ENCOUNTER — Other Ambulatory Visit

## 2024-11-04 ENCOUNTER — Telehealth: Admitting: "Endocrinology
